# Patient Record
Sex: Male | Born: 1940 | Race: White | Hispanic: No | Smoking: Current every day smoker
Health system: Southern US, Community
[De-identification: ages and names within clinical notes are randomized; demographics above are authoritative.]

## PROBLEM LIST (undated history)

## (undated) DIAGNOSIS — I10 Essential (primary) hypertension: Secondary | ICD-10-CM

## (undated) DIAGNOSIS — E785 Hyperlipidemia, unspecified: Secondary | ICD-10-CM

## (undated) DIAGNOSIS — J329 Chronic sinusitis, unspecified: Secondary | ICD-10-CM

## (undated) DIAGNOSIS — N400 Enlarged prostate without lower urinary tract symptoms: Secondary | ICD-10-CM

## (undated) DIAGNOSIS — C439 Malignant melanoma of skin, unspecified: Secondary | ICD-10-CM

## (undated) DIAGNOSIS — F17200 Nicotine dependence, unspecified, uncomplicated: Secondary | ICD-10-CM

## (undated) DIAGNOSIS — S62509A Fracture of unspecified phalanx of unspecified thumb, initial encounter for closed fracture: Secondary | ICD-10-CM

## (undated) HISTORY — PX: LYMPH NODE BIOPSY: SHX201

## (undated) HISTORY — DX: Malignant melanoma of skin, unspecified: C43.9

## (undated) HISTORY — DX: Hyperlipidemia, unspecified: E78.5

## (undated) HISTORY — PX: SPLENECTOMY, TOTAL: SHX788

## (undated) HISTORY — DX: Essential (primary) hypertension: I10

## (undated) HISTORY — DX: Nicotine dependence, unspecified, uncomplicated: F17.200

## (undated) HISTORY — PX: PARTIAL COLECTOMY: SHX5273

## (undated) HISTORY — DX: Fracture of unspecified phalanx of unspecified thumb, initial encounter for closed fracture: S62.509A

## (undated) HISTORY — DX: Chronic sinusitis, unspecified: J32.9

## (undated) HISTORY — PX: MELANOMA EXCISION: SHX5266

---

## 1998-01-09 ENCOUNTER — Encounter: Payer: Self-pay | Admitting: General Surgery

## 1998-01-09 ENCOUNTER — Ambulatory Visit (HOSPITAL_COMMUNITY): Admission: RE | Admit: 1998-01-09 | Discharge: 1998-01-09 | Payer: Self-pay | Admitting: General Surgery

## 1998-01-27 ENCOUNTER — Encounter: Payer: Self-pay | Admitting: General Surgery

## 1998-01-27 ENCOUNTER — Ambulatory Visit (HOSPITAL_COMMUNITY): Admission: AD | Admit: 1998-01-27 | Discharge: 1998-01-28 | Payer: Self-pay | Admitting: General Surgery

## 2004-04-19 ENCOUNTER — Ambulatory Visit: Payer: Self-pay | Admitting: Family Medicine

## 2010-07-25 ENCOUNTER — Encounter: Payer: Self-pay | Admitting: Physician Assistant

## 2012-07-23 ENCOUNTER — Ambulatory Visit (INDEPENDENT_AMBULATORY_CARE_PROVIDER_SITE_OTHER): Payer: Medicare Other | Admitting: Nurse Practitioner

## 2012-07-23 ENCOUNTER — Encounter: Payer: Self-pay | Admitting: Nurse Practitioner

## 2012-07-23 VITALS — BP 108/68 | HR 59 | Temp 97.7°F | Ht 71.0 in | Wt 230.0 lb

## 2012-07-23 DIAGNOSIS — E785 Hyperlipidemia, unspecified: Secondary | ICD-10-CM | POA: Insufficient documentation

## 2012-07-23 DIAGNOSIS — I10 Essential (primary) hypertension: Secondary | ICD-10-CM

## 2012-07-23 LAB — COMPLETE METABOLIC PANEL WITH GFR
ALT: 19 U/L (ref 0–53)
AST: 20 U/L (ref 0–37)
Albumin: 4.1 g/dL (ref 3.5–5.2)
Alkaline Phosphatase: 44 U/L (ref 39–117)
BUN: 13 mg/dL (ref 6–23)
CO2: 27 mEq/L (ref 19–32)
Calcium: 9.6 mg/dL (ref 8.4–10.5)
Chloride: 102 mEq/L (ref 96–112)
Creat: 1.13 mg/dL (ref 0.50–1.35)
GFR, Est African American: 76 mL/min
GFR, Est Non African American: 65 mL/min
Glucose, Bld: 107 mg/dL — ABNORMAL HIGH (ref 70–99)
Potassium: 4.2 mEq/L (ref 3.5–5.3)
Sodium: 138 mEq/L (ref 135–145)
Total Bilirubin: 0.7 mg/dL (ref 0.3–1.2)
Total Protein: 6.9 g/dL (ref 6.0–8.3)

## 2012-07-23 MED ORDER — SIMVASTATIN 40 MG PO TABS: 40.0000 mg | ORAL_TABLET | Freq: Every day | ORAL | Status: DC

## 2012-07-23 MED ORDER — LISINOPRIL-HYDROCHLOROTHIAZIDE 10-12.5 MG PO TABS: 1.0000 | ORAL_TABLET | Freq: Every day | ORAL | Status: DC

## 2012-07-23 NOTE — Progress Notes (Signed)
  Subjective:    Patient ID: Andre Estrada, male    DOB: 12-Jun-1940, 72 y.o.   MRN: [id number]  Hypertension This is a chronic problem. The current episode started more than 1 year ago. The problem has been resolved since onset. The problem is controlled. Pertinent negatives include no blurred vision, chest pain, palpitations, peripheral edema or shortness of breath. Risk factors for coronary artery disease include dyslipidemia, male gender, family history and smoking/tobacco exposure. Past treatments include ACE inhibitors and diuretics. The current treatment provides moderate improvement. There is no history of a thyroid problem.  Hyperlipidemia This is a chronic problem. The current episode started more than 1 year ago. The problem is uncontrolled. Recent lipid tests were reviewed and are high. He has no history of diabetes or hypothyroidism. Pertinent negatives include no chest pain, leg pain or shortness of breath. Current antihyperlipidemic treatment includes statins. The current treatment provides mild improvement of lipids. Compliance problems include adherence to diet.  Risk factors for coronary artery disease include hypertension, male sex and dyslipidemia.      Review of Systems  Constitutional: Negative.   HENT: Negative.   Eyes: Negative for blurred vision.  Respiratory: Negative for shortness of breath.   Cardiovascular: Negative for chest pain and palpitations.  All other systems reviewed and are negative.       Objective:   Physical Exam  Constitutional: He is oriented to person, place, and time. He appears well-developed and well-nourished. No distress.  HENT:  Head: Normocephalic.  Eyes: Pupils are equal, round, and reactive to light.  Neck: Normal range of motion. Neck supple. No JVD present. No thyromegaly present.  Cardiovascular: Normal rate, regular rhythm and normal heart sounds.   Pulmonary/Chest: Effort normal.  Diminished breath sounds bilaterally    Abdominal: Soft. Bowel sounds are normal. There is no tenderness. There is no guarding.  Musculoskeletal: Normal range of motion. He exhibits no edema.  Neurological: He is alert and oriented to person, place, and time.  Skin: Skin is warm and dry.  Birthmark on left face around eye   Psychiatric: He has a normal mood and affect. His behavior is normal. Judgment and thought content normal.    BP 108/68  Pulse 59  Temp(Src) 97.7 F (36.5 C) (Oral)  Ht 5\' 11"  (1.803 m)  Wt 230 lb (104.327 kg)  BMI 32.09 kg/m2       Assessment & Plan:  1. Hypertension Low NA+ diet - COMPLETE METABOLIC PANEL WITH GFR - lisinopril-hydrochlorothiazide (PRINZIDE,ZESTORETIC) 10-12.5 MG per tablet; Take 1 tablet by mouth daily.  Dispense: 30 tablet; Refill: 5  2. Hyperlipemia Low fat diet and exercise - NMR Lipoprofile with Lipids - simvastatin (ZOCOR) 40 MG tablet; Take 1 tablet (40 mg total) by mouth at bedtime.  Dispense: 30 tablet; Refill: 5  [name], FNP

## 2012-07-23 NOTE — Patient Instructions (Addendum)

## 2012-07-24 LAB — NMR LIPOPROFILE WITH LIPIDS
Cholesterol, Total: 123 mg/dL (ref <200)
HDL Particle Number: 28.8 umol/L — ABNORMAL LOW (ref >=30.5)
HDL Size: 8.6 nm — ABNORMAL LOW (ref >=9.2)
HDL-C: 33 mg/dL — ABNORMAL LOW (ref >=40)
LDL (calc): 68 mg/dL (ref <100)
LDL Particle Number: 1253 nmol/L — ABNORMAL HIGH (ref <1000)
LDL Size: 19.8 nm — ABNORMAL LOW (ref >20.5)
LP-IR Score: 66 — ABNORMAL HIGH (ref <=45)
Large HDL-P: 1.4 umol/L — ABNORMAL LOW (ref >=4.8)
Large VLDL-P: 2.4 nmol/L (ref <=2.7)
Small LDL Particle Number: 988 nmol/L — ABNORMAL HIGH (ref <=527)
Triglycerides: 109 mg/dL (ref <150)
VLDL Size: 44.5 nm (ref <=46.6)

## 2013-01-20 ENCOUNTER — Ambulatory Visit (INDEPENDENT_AMBULATORY_CARE_PROVIDER_SITE_OTHER): Payer: Medicare Other | Admitting: Family Medicine

## 2013-01-20 ENCOUNTER — Encounter: Payer: Self-pay | Admitting: Family Medicine

## 2013-01-20 VITALS — BP 124/75 | HR 68 | Temp 97.7°F | Ht 71.5 in | Wt 207.0 lb

## 2013-01-20 DIAGNOSIS — Z7189 Other specified counseling: Secondary | ICD-10-CM

## 2013-01-20 DIAGNOSIS — E785 Hyperlipidemia, unspecified: Secondary | ICD-10-CM

## 2013-01-20 DIAGNOSIS — F17200 Nicotine dependence, unspecified, uncomplicated: Secondary | ICD-10-CM

## 2013-01-20 DIAGNOSIS — Z716 Tobacco abuse counseling: Secondary | ICD-10-CM

## 2013-01-20 DIAGNOSIS — I10 Essential (primary) hypertension: Secondary | ICD-10-CM

## 2013-01-20 LAB — POCT CBC
Granulocyte percent: 70.6 % (ref 37–80)
HCT, POC: 50.9 % (ref 43.5–53.7)
Hemoglobin: 16.5 g/dL (ref 14.1–18.1)
Lymph, poc: 2.8 (ref 0.6–3.4)
MCH, POC: 30.5 pg (ref 27–31.2)
MCHC: 32.5 g/dL (ref 31.8–35.4)
MCV: 94 fL (ref 80–97)
MPV: 8.8 fL (ref 0–99.8)
POC Granulocyte: 7.7 — AB (ref 2–6.9)
POC LYMPH PERCENT: 25.3 % (ref 10–50)
Platelet Count, POC: 227 10*3/uL (ref 142–424)
RBC: 5.4 M/uL (ref 4.69–6.13)
RDW, POC: 13.2 %
WBC: 10.9 10*3/uL — AB (ref 4.6–10.2)

## 2013-01-20 LAB — POCT GLYCOSYLATED HEMOGLOBIN (HGB A1C): Hemoglobin A1C: 5.3

## 2013-01-20 NOTE — Progress Notes (Signed)
   Subjective:    Patient ID: Andre Estrada, male    DOB: [birth year], 72 y.o.   MRN: [id number]  HPI Pt here for follow up and management of chronic medical problems.  No acute issues currently  HLD: Stil eating somewhat high fat diet.  Is compliant with zocor.  Smoking 1 PPD  Is trying to quit.  Taking baby ASA daily Not exercising  Mood/Social: Wife passed away about 7 months ago from complications of colon cancer.  Overall mood stable.  Is taking vistaril prn for anxiety, but feels like he doesn't really need it.   HTN: No CP or SOB Is not checking BPs daily.      Patient Active Problem List   Diagnosis Date Noted  . Hypertension 07/23/2012  . Hyperlipemia 07/23/2012   Outpatient Encounter Prescriptions as of 01/20/2013  Medication Sig  . aspirin 81 MG tablet Take 81 mg by mouth daily.  Marland Kitchen docusate sodium (COLACE) 100 MG capsule Take 100 mg by mouth 2 (two) times daily.  . hydrOXYzine (VISTARIL) 25 MG capsule Take 25 mg by mouth every 6 (six) hours as needed for anxiety.  Marland Kitchen lisinopril-hydrochlorothiazide (PRINZIDE,ZESTORETIC) 10-12.5 MG per tablet Take 1 tablet by mouth daily.  . simvastatin (ZOCOR) 40 MG tablet Take 1 tablet (40 mg total) by mouth at bedtime.    Review of Systems  Constitutional: Negative.   HENT: Negative.   Eyes: Negative.   Respiratory: Negative.   Cardiovascular: Negative.   Gastrointestinal: Negative.   Endocrine: Negative.   Genitourinary: Negative.   Musculoskeletal: Negative.   Skin: Negative.   Allergic/Immunologic: Negative.   Neurological: Negative.   Hematological: Negative.   Psychiatric/Behavioral: Negative.  Nervous/anxious: wife passed away in 07-20-2022.        Objective:   Physical Exam  Constitutional: He appears well-developed and well-nourished.  HENT:  Head: Normocephalic and atraumatic.  Eyes: Conjunctivae are normal. Pupils are equal, round, and reactive to light.  Neck: Normal range of motion. Neck supple.    Cardiovascular: Normal rate and regular rhythm.   Pulmonary/Chest: Effort normal and breath sounds normal.  Abdominal: Soft.  Musculoskeletal: Normal range of motion.  Neurological: He is alert.  Skin:  l sided hemangioma stable.     BP 124/75  Pulse 68  Temp(Src) 97.7 F (36.5 C) (Oral)  Ht 5' 11.5" (1.816 m)  Wt 207 lb (93.895 kg)  BMI 28.47 kg/m2        Assessment & Plan:  Hypertension - Plan: Comprehensive metabolic panel, POCT CBC, POCT glycosylated hemoglobin (Hb A1C), Comprehensive metabolic panel  Hyperlipemia - Plan: NMR, lipoprofile  Check chronic labs  Tobacco abuse counseling.  BP stable.  Continue baby ASA up to date on immunizations and colonoscopy. Follow up in 6-12 months.

## 2013-01-20 NOTE — Patient Instructions (Signed)
Continue current medications. Continue good therapeutic lifestyle changes which include good diet and exercise. Fall precautions discussed with patient. Schedule your flu vaccine if you haven't had it yet If you are over 71 years old - you may need Prevnar 13 or the adult Pneumonia vaccine.  

## 2013-01-22 LAB — CMP14+EGFR
Albumin/Globulin Ratio: 1.8 (ref 1.1–2.5)
Albumin: 4.4 g/dL (ref 3.5–4.8)
Alkaline Phosphatase: 52 IU/L (ref 39–117)
BUN/Creatinine Ratio: 12 (ref 10–22)
BUN: 13 mg/dL (ref 8–27)
CO2: 27 mmol/L (ref 18–29)
Chloride: 98 mmol/L (ref 97–108)
Creatinine, Ser: 1.09 mg/dL (ref 0.76–1.27)
GFR calc Af Amer: 79 mL/min/{1.73_m2} (ref >59)
GFR calc non Af Amer: 68 mL/min/{1.73_m2} (ref >59)
Globulin, Total: 2.4 g/dL (ref 1.5–4.5)
Sodium: 142 mmol/L (ref 134–144)
Total Bilirubin: 0.6 mg/dL (ref 0.0–1.2)
Total Protein: 6.8 g/dL (ref 6.0–8.5)

## 2013-01-22 LAB — NMR, LIPOPROFILE
Cholesterol: 129 mg/dL (ref <200)
HDL Cholesterol by NMR: 35 mg/dL — ABNORMAL LOW (ref >=40)
LDL Particle Number: 1232 nmol/L — ABNORMAL HIGH (ref <1000)
LP-IR Score: 60 — ABNORMAL HIGH (ref <=45)
Small LDL Particle Number: 599 nmol/L — ABNORMAL HIGH (ref <=527)
Triglycerides by NMR: 140 mg/dL (ref <150)

## 2013-01-25 ENCOUNTER — Other Ambulatory Visit: Payer: Self-pay | Admitting: Family Medicine

## 2013-01-25 DIAGNOSIS — E785 Hyperlipidemia, unspecified: Secondary | ICD-10-CM

## 2013-01-25 MED ORDER — ROSUVASTATIN CALCIUM 40 MG PO TABS: 40.0000 mg | ORAL_TABLET | Freq: Every day | ORAL | Status: DC

## 2013-02-01 ENCOUNTER — Other Ambulatory Visit: Payer: Self-pay

## 2013-02-01 DIAGNOSIS — I10 Essential (primary) hypertension: Secondary | ICD-10-CM

## 2013-02-01 MED ORDER — LISINOPRIL-HYDROCHLOROTHIAZIDE 10-12.5 MG PO TABS: 1.0000 | ORAL_TABLET | Freq: Every day | ORAL | Status: DC

## 2013-02-08 ENCOUNTER — Telehealth: Payer: Self-pay | Admitting: *Deleted

## 2013-02-09 ENCOUNTER — Telehealth: Payer: Self-pay | Admitting: *Deleted

## 2013-02-09 NOTE — Telephone Encounter (Signed)
Pt notified of lab results Verbalizes understanding 

## 2013-02-09 NOTE — Telephone Encounter (Signed)
Pt notified of results

## 2013-04-05 ENCOUNTER — Other Ambulatory Visit: Payer: Self-pay

## 2013-04-05 NOTE — Telephone Encounter (Signed)
Last seen 01/20/13  Dr Ernestina Patches  This med not on EPIC list

## 2013-04-06 MED ORDER — SIMVASTATIN 40 MG PO TABS: 40.0000 mg | ORAL_TABLET | Freq: Every day | ORAL | Status: DC

## 2013-07-01 ENCOUNTER — Other Ambulatory Visit: Payer: Self-pay

## 2013-07-01 MED ORDER — SIMVASTATIN 40 MG PO TABS: 40.0000 mg | ORAL_TABLET | Freq: Every day | ORAL | Status: DC

## 2013-07-01 NOTE — Telephone Encounter (Signed)
Last lipid 01/20/14  Requesting 90 day supply  Dr Ernestina Patches

## 2013-08-06 ENCOUNTER — Ambulatory Visit (INDEPENDENT_AMBULATORY_CARE_PROVIDER_SITE_OTHER): Payer: Medicare Other | Admitting: Nurse Practitioner

## 2013-08-06 ENCOUNTER — Encounter: Payer: Self-pay | Admitting: Nurse Practitioner

## 2013-08-06 VITALS — BP 120/62 | HR 83 | Temp 97.9°F | Ht 71.5 in | Wt 198.4 lb

## 2013-08-06 DIAGNOSIS — I10 Essential (primary) hypertension: Secondary | ICD-10-CM

## 2013-08-06 DIAGNOSIS — I158 Other secondary hypertension: Secondary | ICD-10-CM

## 2013-08-06 DIAGNOSIS — E785 Hyperlipidemia, unspecified: Secondary | ICD-10-CM

## 2013-08-06 MED ORDER — SIMVASTATIN 40 MG PO TABS
40.0000 mg | ORAL_TABLET | Freq: Every day | ORAL | Status: DC
Start: 2013-08-06 — End: 2014-02-14

## 2013-08-06 MED ORDER — LISINOPRIL-HYDROCHLOROTHIAZIDE 10-12.5 MG PO TABS: 1.0000 | ORAL_TABLET | Freq: Every day | ORAL | Status: DC

## 2013-08-06 NOTE — Progress Notes (Signed)
  Subjective:    Patient ID: Andre Estrada, male    DOB: 05-Jul-1940, 73 y.o.   MRN: [id number]  Patient here today for chronic follow.   Hypertension This is a chronic problem. The current episode started more than 1 year ago. The problem has been resolved since onset. The problem is controlled. Pertinent negatives include no blurred vision, chest pain, palpitations, peripheral edema or shortness of breath. Risk factors for coronary artery disease include dyslipidemia, male gender, family history and smoking/tobacco exposure. Past treatments include ACE inhibitors and diuretics. The current treatment provides moderate improvement. There is no history of a thyroid problem.  Hyperlipidemia This is a chronic problem. The current episode started more than 1 year ago. The problem is uncontrolled. Recent lipid tests were reviewed and are high. He has no history of diabetes or hypothyroidism. Pertinent negatives include no chest pain, leg pain or shortness of breath. Current antihyperlipidemic treatment includes statins. The current treatment provides mild improvement of lipids. Compliance problems include adherence to diet.  Risk factors for coronary artery disease include hypertension, male sex and dyslipidemia.      Review of Systems  Constitutional: Negative.   HENT: Positive for congestion. Negative for sore throat.   Eyes: Negative.  Negative for blurred vision.  Respiratory: Positive for cough. Negative for shortness of breath.   Cardiovascular: Negative.  Negative for chest pain and palpitations.  Gastrointestinal: Negative.   Endocrine: Negative.   Genitourinary: Negative.   Musculoskeletal: Negative.   Allergic/Immunologic: Negative.   Neurological: Negative.   Hematological: Negative.   Psychiatric/Behavioral: Negative.   All other systems reviewed and are negative.      Objective:   Physical Exam  Constitutional: He is oriented to person, place, and time. He appears  well-developed and well-nourished. No distress.  HENT:  Head: Normocephalic.  Eyes: Pupils are equal, round, and reactive to light.  Neck: Normal range of motion. Neck supple. No JVD present. No thyromegaly present.  Cardiovascular: Normal rate, regular rhythm and normal heart sounds.   Pulmonary/Chest: Effort normal.  Diminished breath sounds bilaterally   Abdominal: Soft. Bowel sounds are normal. There is no tenderness. There is no guarding.  Musculoskeletal: Normal range of motion. He exhibits no edema.  Neurological: He is alert and oriented to person, place, and time.  Skin: Skin is warm and dry.  Birthmark on left face around eye   Psychiatric: He has a normal mood and affect. His behavior is normal. Judgment and thought content normal.     BP 120/62  Pulse 83  Temp(Src) 97.9 F (36.6 C) (Oral)  Ht 5' 11.5" (1.816 m)  Wt 198 lb 6.4 oz (89.994 kg)  BMI 27.29 kg/m2      Assessment & Plan:   1. Hyperlipemia   2. Essential hypertension     Orders Placed This Encounter  Procedures  . CMP14+EGFR  . NMR, lipoprofile    hemoccult cards given to patient with directions Labs pending Health maintenance reviewed Diet and exercise encouraged Continue all meds Follow up  In 6 montH  Delta, FNP

## 2013-08-06 NOTE — Patient Instructions (Signed)
Health Maintenance A healthy lifestyle and preventative care can promote health and wellness.  Maintain regular health, dental, and eye exams.  Eat a healthy diet. Foods like vegetables, fruits, whole grains, low-fat dairy products, and lean protein foods contain the nutrients you need and are low in calories. Decrease your intake of foods high in solid fats, added sugars, and salt. Get information about a proper diet from your health care Indio Santilli, if necessary.  Regular physical exercise is one of the most important things you can do for your health. Most adults should get at least 150 minutes of moderate-intensity exercise (any activity that increases your heart rate and causes you to sweat) each week. In addition, most adults need muscle-strengthening exercises on 2 or more days a week.   Maintain a healthy weight. The body mass index (BMI) is a screening tool to identify possible weight problems. It provides an estimate of body fat based on height and weight. Your health care Gordy Goar can find your BMI and can help you achieve or maintain a healthy weight. For males 20 years and older:  A BMI below 18.5 is considered underweight.  A BMI of 18.5 to 24.9 is normal.  A BMI of 25 to 29.9 is considered overweight.  A BMI of 30 and above is considered obese.  Maintain normal blood lipids and cholesterol by exercising and minimizing your intake of saturated fat. Eat a balanced diet with plenty of fruits and vegetables. Blood tests for lipids and cholesterol should begin at age 20 and be repeated every 5 years. If your lipid or cholesterol levels are high, you are over age 50, or you are at high risk for heart disease, you may need your cholesterol levels checked more frequently.Ongoing high lipid and cholesterol levels should be treated with medicines if diet and exercise are not working.  If you smoke, find out from your health care Brinlynn Gorton how to quit. If you do not use tobacco, do not  start.  Lung cancer screening is recommended for adults aged 55-80 years who are at high risk for developing lung cancer because of a history of smoking. A yearly low-dose CT scan of the lungs is recommended for people who have at least a 30-pack-year history of smoking and are current smokers or have quit within the past 15 years. A pack year of smoking is smoking an average of 1 pack of cigarettes a day for 1 year (for example, a 30-pack-year history of smoking could mean smoking 1 pack a day for 30 years or 2 packs a day for 15 years). Yearly screening should continue until the smoker has stopped smoking for at least 15 years. Yearly screening should be stopped for people who develop a health problem that would prevent them from having lung cancer treatment.  If you choose to drink alcohol, do not have more than 2 drinks per day. One drink is considered to be 12 oz (360 mL) of beer, 5 oz (150 mL) of wine, or 1.5 oz (45 mL) of liquor.  Avoid the use of street drugs. Do not share needles with anyone. Ask for help if you need support or instructions about stopping the use of drugs.  High blood pressure causes heart disease and increases the risk of stroke. Blood pressure should be checked at least every 1-2 years. Ongoing high blood pressure should be treated with medicines if weight loss and exercise are not effective.  If you are 45-79 years old, ask your health care Haruka Kowaleski if   you should take aspirin to prevent heart disease.  Diabetes screening involves taking a blood sample to check your fasting blood sugar level. This should be done once every 3 years after age 45 if you are at a normal weight and without risk factors for diabetes. Testing should be considered at a younger age or be carried out more frequently if you are overweight and have at least 1 risk factor for diabetes.  Colorectal cancer can be detected and often prevented. Most routine colorectal cancer screening begins at the age of 50  and continues through age 75. However, your health care Othel Dicostanzo may recommend screening at an earlier age if you have risk factors for colon cancer. On a yearly basis, your health care Ericha Whittingham may provide home test kits to check for hidden blood in the stool. A small camera at the end of a tube may be used to directly examine the colon (sigmoidoscopy or colonoscopy) to detect the earliest forms of colorectal cancer. Talk to your health care Faris Coolman about this at age 50 when routine screening begins. A direct exam of the colon should be repeated every 5-10 years through age 75, unless early forms of precancerous polyps or small growths are found.  People who are at an increased risk for hepatitis B should be screened for this virus. You are considered at high risk for hepatitis B if:  You were born in a country where hepatitis B occurs often. Talk with your health care Jaskirat Zertuche about which countries are considered high risk.  Your parents were born in a high-risk country and you have not received a shot to protect against hepatitis B (hepatitis B vaccine).  You have HIV or AIDS.  You use needles to inject street drugs.  You live with, or have sex with, someone who has hepatitis B.  You are a man who has sex with other men (MSM).  You get hemodialysis treatment.  You take certain medicines for conditions like cancer, organ transplantation, and autoimmune conditions.  Hepatitis C blood testing is recommended for all people born from 1945 through 1965 and any individual with known risk factors for hepatitis C.  Healthy men should no longer receive prostate-specific antigen (PSA) blood tests as part of routine cancer screening. Talk to your health care Dyamond Tolosa about prostate cancer screening.  Testicular cancer screening is not recommended for adolescents or adult males who have no symptoms. Screening includes self-exam, a health care Kanna Dafoe exam, and other screening tests. Consult with your  health care Jasaiah Karwowski about any symptoms you have or any concerns you have about testicular cancer.  Practice safe sex. Use condoms and avoid high-risk sexual practices to reduce the spread of sexually transmitted infections (STIs).  You should be screened for STIs, including gonorrhea and chlamydia if:  You are sexually active and are younger than 24 years.  You are older than 24 years, and your health care Gertude Benito tells you that you are at risk for this type of infection.  Your sexual activity has changed since you were last screened, and you are at an increased risk for chlamydia or gonorrhea. Ask your health care Kaemon Barnett if you are at risk.  If you are at risk of being infected with HIV, it is recommended that you take a prescription medicine daily to prevent HIV infection. This is called pre-exposure prophylaxis (PrEP). You are considered at risk if:  You are a man who has sex with other men (MSM).  You are a heterosexual man who   is sexually active with multiple partners.  You take drugs by injection.  You are sexually active with a partner who has HIV.  Talk with your health care Clements Toro about whether you are at high risk of being infected with HIV. If you choose to begin PrEP, you should first be tested for HIV. You should then be tested every 3 months for as long as you are taking PrEP.  Use sunscreen. Apply sunscreen liberally and repeatedly throughout the day. You should seek shade when your shadow is shorter than you. Protect yourself by wearing long sleeves, pants, a wide-brimmed hat, and sunglasses year round whenever you are outdoors.  Tell your health care Krystall Kruckenberg of new moles or changes in moles, especially if there is a change in shape or color. Also, tell your health care Naphtali Zywicki if a mole is larger than the size of a pencil eraser.  A one-time screening for abdominal aortic aneurysm (AAA) and surgical repair of large AAAs by ultrasound is recommended for men aged  65-75 years who are current or former smokers.  Stay current with your vaccines (immunizations). Document Released: 08/03/2007 Document Revised: 02/09/2013 Document Reviewed: 07/02/2010 Exit[employer] Patient Information 2015 [employer]. This information is not intended to replace advice given to you by your health care Arbor Cohen. Make sure you discuss any questions you have with your health care Stepanie Graver.  

## 2013-08-07 LAB — CMP14+EGFR
A/G RATIO: 1.3 (ref 1.1–2.5)
ALT: 16 IU/L (ref 0–44)
AST: 19 IU/L (ref 0–40)
Albumin: 3.9 g/dL (ref 3.5–4.8)
Alkaline Phosphatase: 47 IU/L (ref 39–117)
BUN/Creatinine Ratio: 13 (ref 10–22)
BUN: 13 mg/dL (ref 8–27)
CALCIUM: 9.6 mg/dL (ref 8.6–10.2)
CO2: 25 mmol/L (ref 18–29)
CREATININE: 1.04 mg/dL (ref 0.76–1.27)
Chloride: 102 mmol/L (ref 97–108)
GFR calc Af Amer: 83 mL/min/{1.73_m2} (ref >59)
GFR, EST NON AFRICAN AMERICAN: 72 mL/min/{1.73_m2} (ref >59)
GLUCOSE: 106 mg/dL — AB (ref 65–99)
Globulin, Total: 2.9 g/dL (ref 1.5–4.5)
Potassium: 4.5 mmol/L (ref 3.5–5.2)
SODIUM: 143 mmol/L (ref 134–144)
TOTAL PROTEIN: 6.8 g/dL (ref 6.0–8.5)
Total Bilirubin: 0.4 mg/dL (ref 0.0–1.2)

## 2013-08-07 LAB — NMR, LIPOPROFILE
CHOLESTEROL: 112 mg/dL (ref 100–199)
HDL CHOLESTEROL BY NMR: 31 mg/dL — AB (ref >39)
HDL Particle Number: 22.2 umol/L — ABNORMAL LOW (ref >=30.5)
LDL Particle Number: 942 nmol/L (ref <1000)
LDL Size: 20.5 nm (ref >20.5)
LDLC SERPL CALC-MCNC: 60 mg/dL (ref 0–99)
LP-IR Score: 44 (ref <=45)
Small LDL Particle Number: 559 nmol/L — ABNORMAL HIGH (ref <=527)
TRIGLYCERIDES BY NMR: 103 mg/dL (ref 0–149)

## 2013-08-09 ENCOUNTER — Telehealth: Payer: Self-pay | Admitting: Family Medicine

## 2013-08-09 NOTE — Telephone Encounter (Signed)
Message copied by Waverly Ferrari on Mon Aug 09, 2013 10:14 AM ------      Message from: Chevis Pretty      Created: Sat Aug 07, 2013  9:45 AM       Kidney and liver function stable      Cholesterol looks great      Continue current meds- low fat diet and exercise and recheck in 3 months       ------

## 2013-08-09 NOTE — Telephone Encounter (Signed)
Pt aware of lab results 

## 2013-08-12 ENCOUNTER — Other Ambulatory Visit: Payer: Medicare Other

## 2013-08-12 DIAGNOSIS — Z1212 Encounter for screening for malignant neoplasm of rectum: Secondary | ICD-10-CM

## 2013-08-13 LAB — FECAL OCCULT BLOOD, IMMUNOCHEMICAL: Fecal Occult Bld: NEGATIVE

## 2013-09-13 ENCOUNTER — Encounter: Payer: Self-pay | Admitting: *Deleted

## 2013-11-08 ENCOUNTER — Ambulatory Visit (INDEPENDENT_AMBULATORY_CARE_PROVIDER_SITE_OTHER): Payer: Medicare Other | Admitting: Nurse Practitioner

## 2013-11-08 ENCOUNTER — Encounter: Payer: Self-pay | Admitting: Nurse Practitioner

## 2013-11-08 VITALS — BP 139/92 | HR 97 | Temp 97.4°F | Ht 71.5 in | Wt 198.0 lb

## 2013-11-08 DIAGNOSIS — I159 Secondary hypertension, unspecified: Secondary | ICD-10-CM

## 2013-11-08 DIAGNOSIS — I158 Other secondary hypertension: Secondary | ICD-10-CM

## 2013-11-08 DIAGNOSIS — E785 Hyperlipidemia, unspecified: Secondary | ICD-10-CM

## 2013-11-08 NOTE — Progress Notes (Signed)
  Subjective:    Patient ID: Andre Estrada, male    DOB: [birth year], 73 y.o.   MRN: [id number]  Patient here today for chronic disease follow up. No acute complaint.    Hypertension This is a chronic problem. The current episode started more than 1 year ago. The problem has been resolved since onset. The problem is controlled. Pertinent negatives include no blurred vision, chest pain, palpitations, peripheral edema or shortness of breath. Risk factors for coronary artery disease include dyslipidemia, male gender, family history and smoking/tobacco exposure. Past treatments include ACE inhibitors and diuretics. The current treatment provides moderate improvement. There is no history of a thyroid problem.  Hyperlipidemia This is a chronic problem. The current episode started more than 1 year ago. The problem is uncontrolled. Recent lipid tests were reviewed and are high. He has no history of diabetes or hypothyroidism. Pertinent negatives include no chest pain, leg pain or shortness of breath. Current antihyperlipidemic treatment includes statins. The current treatment provides mild improvement of lipids. Compliance problems include adherence to diet.  Risk factors for coronary artery disease include hypertension, male sex and dyslipidemia.      Review of Systems  Constitutional: Negative.   HENT: Negative.  Negative for sore throat.   Eyes: Negative.  Negative for blurred vision.  Respiratory: Negative.  Negative for shortness of breath.   Cardiovascular: Negative.  Negative for chest pain and palpitations.  Gastrointestinal: Negative.   Endocrine: Negative.   Genitourinary: Negative.   Musculoskeletal: Negative.   Skin: Negative.   Allergic/Immunologic: Negative.   Neurological: Negative.   Hematological: Negative.   Psychiatric/Behavioral: Negative.   All other systems reviewed and are negative.      Objective:   Physical Exam  Constitutional: He is oriented to person, place, and  time. He appears well-developed and well-nourished. No distress.  HENT:  Head: Normocephalic.  Eyes: Pupils are equal, round, and reactive to light.  Neck: Normal range of motion. Neck supple. No JVD present. No thyromegaly present.  Cardiovascular: Normal rate, regular rhythm and normal heart sounds.   Pulmonary/Chest: Effort normal.  Diminished breath sounds bilaterally.   Abdominal: Soft. Bowel sounds are normal. There is no tenderness. There is no guarding.  Musculoskeletal: Normal range of motion. He exhibits no edema.  Neurological: He is alert and oriented to person, place, and time.  Skin: Skin is warm and dry.  Birthmark on left face around eye   Psychiatric: He has a normal mood and affect. His behavior is normal. Judgment and thought content normal.     BP 139/92  Pulse 97  Temp(Src) 97.4 F (36.3 C) (Oral)  Ht 5' 11.5" (1.816 m)  Wt 198 lb (89.812 kg)  BMI 27.23 kg/m2      Assessment & Plan:   1. Secondary hypertension, unspecified - CMP14+EGFR  2. Hyperlipemia - NMR, lipoprofile    Labs pending Health maintenance reviewed Diet and exercise encouraged Continue all meds Follow up  In 3 months PRN.    Mary-Margaret Hassell Done, FNP

## 2013-11-08 NOTE — Patient Instructions (Signed)

## 2013-11-09 LAB — NMR, LIPOPROFILE
CHOLESTEROL: 143 mg/dL (ref 100–199)
HDL Cholesterol by NMR: 48 mg/dL (ref >39)
HDL Particle Number: 28.5 umol/L — ABNORMAL LOW (ref >=30.5)
LDL Particle Number: 876 nmol/L (ref <1000)
LDL SIZE: 20.9 nm (ref >20.5)
LDLC SERPL CALC-MCNC: 78 mg/dL (ref 0–99)
LP-IR Score: 41 (ref <=45)
Small LDL Particle Number: 161 nmol/L (ref <=527)
TRIGLYCERIDES BY NMR: 83 mg/dL (ref 0–149)

## 2013-11-09 LAB — CMP14+EGFR
ALK PHOS: 44 IU/L (ref 39–117)
ALT: 13 IU/L (ref 0–44)
AST: 17 IU/L (ref 0–40)
Albumin/Globulin Ratio: 2 (ref 1.1–2.5)
Albumin: 4.4 g/dL (ref 3.5–4.8)
BILIRUBIN TOTAL: 0.5 mg/dL (ref 0.0–1.2)
BUN / CREAT RATIO: 15 (ref 10–22)
BUN: 16 mg/dL (ref 8–27)
CO2: 24 mmol/L (ref 18–29)
CREATININE: 1.06 mg/dL (ref 0.76–1.27)
Calcium: 9.6 mg/dL (ref 8.6–10.2)
Chloride: 103 mmol/L (ref 97–108)
GFR calc Af Amer: 81 mL/min/{1.73_m2} (ref >59)
GFR, EST NON AFRICAN AMERICAN: 70 mL/min/{1.73_m2} (ref >59)
GLOBULIN, TOTAL: 2.2 g/dL (ref 1.5–4.5)
GLUCOSE: 99 mg/dL (ref 65–99)
Potassium: 4.1 mmol/L (ref 3.5–5.2)
Sodium: 143 mmol/L (ref 134–144)
TOTAL PROTEIN: 6.6 g/dL (ref 6.0–8.5)

## 2013-11-17 ENCOUNTER — Ambulatory Visit (INDEPENDENT_AMBULATORY_CARE_PROVIDER_SITE_OTHER): Payer: Medicare Other

## 2013-11-17 DIAGNOSIS — Z23 Encounter for immunization: Secondary | ICD-10-CM

## 2014-02-14 ENCOUNTER — Encounter: Payer: Self-pay | Admitting: Nurse Practitioner

## 2014-02-14 ENCOUNTER — Ambulatory Visit (INDEPENDENT_AMBULATORY_CARE_PROVIDER_SITE_OTHER): Payer: Medicare Other | Admitting: Nurse Practitioner

## 2014-02-14 ENCOUNTER — Ambulatory Visit (INDEPENDENT_AMBULATORY_CARE_PROVIDER_SITE_OTHER): Payer: Medicare Other

## 2014-02-14 DIAGNOSIS — I10 Essential (primary) hypertension: Secondary | ICD-10-CM

## 2014-02-14 DIAGNOSIS — Z720 Tobacco use: Secondary | ICD-10-CM

## 2014-02-14 DIAGNOSIS — Z125 Encounter for screening for malignant neoplasm of prostate: Secondary | ICD-10-CM

## 2014-02-14 DIAGNOSIS — F17200 Nicotine dependence, unspecified, uncomplicated: Secondary | ICD-10-CM

## 2014-02-14 DIAGNOSIS — E785 Hyperlipidemia, unspecified: Secondary | ICD-10-CM

## 2014-02-14 MED ORDER — LISINOPRIL-HYDROCHLOROTHIAZIDE 10-12.5 MG PO TABS: 1.0000 | ORAL_TABLET | Freq: Every day | ORAL | Status: DC

## 2014-02-14 MED ORDER — SIMVASTATIN 40 MG PO TABS: 40.0000 mg | ORAL_TABLET | Freq: Every day | ORAL | Status: DC

## 2014-02-14 NOTE — Progress Notes (Signed)
Subjective:    Patient ID: Andre Estrada, male    DOB: 1940-09-05, 73 y.o.   MRN: [id number]  Patient here today for chronic disease follow up. No acute complaint.    Hypertension This is a chronic problem. The current episode started more than 1 year ago. The problem is controlled. Pertinent negatives include no chest pain, palpitations or shortness of breath. Risk factors for coronary artery disease include dyslipidemia. Past treatments include ACE inhibitors and diuretics. The current treatment provides moderate improvement. Compliance problems include diet and exercise.   Hyperlipidemia This is a chronic problem. The current episode started more than 1 year ago. The problem is controlled. Recent lipid tests were reviewed and are normal. He has no history of diabetes, hypothyroidism or obesity. Pertinent negatives include no chest pain or shortness of breath. Current antihyperlipidemic treatment includes statins. The current treatment provides moderate improvement of lipids. Compliance problems include adherence to diet and adherence to exercise.  Risk factors for coronary artery disease include dyslipidemia and hypertension.      Review of Systems  Constitutional: Negative.   HENT: Negative.  Negative for sore throat.   Eyes: Negative.   Respiratory: Negative.  Negative for shortness of breath.   Cardiovascular: Negative.  Negative for chest pain and palpitations.  Gastrointestinal: Negative.   Endocrine: Negative.   Genitourinary: Negative.   Musculoskeletal: Negative.   Skin: Negative.   Allergic/Immunologic: Negative.   Neurological: Negative.   Hematological: Negative.   Psychiatric/Behavioral: Negative.   All other systems reviewed and are negative.      Objective:   Physical Exam  Constitutional: He is oriented to person, place, and time. He appears well-developed and well-nourished.  HENT:  Head: Normocephalic.  Right Ear: External ear normal.  Left Ear: External  ear normal.  Nose: Nose normal.  Mouth/Throat: Oropharynx is clear and moist.  Eyes: EOM are normal. Pupils are equal, round, and reactive to light.  Neck: Normal range of motion. Neck supple. No JVD present. No thyromegaly present.  Cardiovascular: Normal rate, regular rhythm, normal heart sounds and intact distal pulses.  Exam reveals no gallop and no friction rub.   No murmur heard. Pulmonary/Chest: Effort normal and breath sounds normal. No respiratory distress. He has no wheezes. He has no rales. He exhibits no tenderness.  Abdominal: Soft. Bowel sounds are normal. He exhibits no mass. There is no tenderness.  Genitourinary: Prostate normal and penis normal.  Musculoskeletal: Normal range of motion. He exhibits no edema.  Lymphadenopathy:    He has no cervical adenopathy.  Neurological: He is alert and oriented to person, place, and time. No cranial nerve deficit.  Skin: Skin is warm and dry.  Psychiatric: He has a normal mood and affect. His behavior is normal. Judgment and thought content normal.     BP 123/81 mmHg  Pulse 68  Temp(Src) 96.9 F (36.1 C) (Oral)  Ht _0  (1.803 m)  Wt 197 lb (89.359 kg)  BMI 27.49 kg/m2  EKG- NSR-Mary-Margaret Hassell Done, FNP Chest x ray- chronic bronchitic Kathlen Mody, FNP     Assessment & Plan:  1. Hyperlipemia Low fat diet - simvastatin (ZOCOR) 40 MG tablet; Take 1 tablet (40 mg total) by mouth at bedtime.  Dispense: 90 tablet; Refill: 1 - NMR, lipoprofile  2. Essential hypertension Do not add salt to diet - lisinopril-hydrochlorothiazide (PRINZIDE,ZESTORETIC) 10-12.5 MG per tablet; Take 1 tablet by mouth daily.  Dispense: 90 tablet; Refill: 1 - CMP14+EGFR - EKG 12-Lead  3. Prostate cancer screening -  PSA, total and free  4. Smoker Smoking cessation encouraged - DG Chest 2 View; Future    Labs pending Health maintenance reviewed Diet and exercise encouraged Continue all meds Follow up  In 3 month    [place], FNP

## 2014-02-15 LAB — CMP14+EGFR
A/G RATIO: 1.7 (ref 1.1–2.5)
ALT: 13 IU/L (ref 0–44)
AST: 18 IU/L (ref 0–40)
Albumin: 4.3 g/dL (ref 3.5–4.8)
Alkaline Phosphatase: 55 IU/L (ref 39–117)
BILIRUBIN TOTAL: 0.7 mg/dL (ref 0.0–1.2)
BUN / CREAT RATIO: 9 — AB (ref 10–22)
BUN: 10 mg/dL (ref 8–27)
CO2: 25 mmol/L (ref 18–29)
Calcium: 9.5 mg/dL (ref 8.6–10.2)
Chloride: 99 mmol/L (ref 97–108)
Creatinine, Ser: 1.11 mg/dL (ref 0.76–1.27)
GFR, EST AFRICAN AMERICAN: 76 mL/min/{1.73_m2} (ref >59)
GFR, EST NON AFRICAN AMERICAN: 66 mL/min/{1.73_m2} (ref >59)
GLOBULIN, TOTAL: 2.6 g/dL (ref 1.5–4.5)
Glucose: 95 mg/dL (ref 65–99)
POTASSIUM: 4 mmol/L (ref 3.5–5.2)
Sodium: 140 mmol/L (ref 134–144)
Total Protein: 6.9 g/dL (ref 6.0–8.5)

## 2014-02-15 LAB — NMR, LIPOPROFILE
Cholesterol: 123 mg/dL (ref 100–199)
HDL Cholesterol by NMR: 41 mg/dL (ref >39)
HDL Particle Number: 27 umol/L — ABNORMAL LOW (ref >=30.5)
LDL PARTICLE NUMBER: 917 nmol/L (ref <1000)
LDL SIZE: 20.8 nm (ref >20.5)
LDL-C: 64 mg/dL (ref 0–99)
LP-IR SCORE: 52 — AB (ref <=45)
Small LDL Particle Number: 367 nmol/L (ref <=527)
TRIGLYCERIDES BY NMR: 88 mg/dL (ref 0–149)

## 2014-02-15 LAB — PSA, TOTAL AND FREE
PSA FREE: 1.76 ng/mL
PSA, Free Pct: 27.1 %
PSA: 6.5 ng/mL — ABNORMAL HIGH (ref 0.0–4.0)

## 2014-02-16 ENCOUNTER — Other Ambulatory Visit: Payer: Self-pay

## 2014-02-16 DIAGNOSIS — R972 Elevated prostate specific antigen [PSA]: Secondary | ICD-10-CM

## 2014-03-28 DIAGNOSIS — R972 Elevated prostate specific antigen [PSA]: Secondary | ICD-10-CM | POA: Diagnosis not present

## 2014-03-28 DIAGNOSIS — R3911 Hesitancy of micturition: Secondary | ICD-10-CM | POA: Diagnosis not present

## 2014-03-28 DIAGNOSIS — R312 Other microscopic hematuria: Secondary | ICD-10-CM | POA: Diagnosis not present

## 2014-03-28 DIAGNOSIS — N403 Nodular prostate with lower urinary tract symptoms: Secondary | ICD-10-CM | POA: Diagnosis not present

## 2014-03-29 ENCOUNTER — Other Ambulatory Visit: Payer: Self-pay | Admitting: Urology

## 2014-03-29 DIAGNOSIS — R3129 Other microscopic hematuria: Secondary | ICD-10-CM

## 2014-04-05 ENCOUNTER — Ambulatory Visit (HOSPITAL_COMMUNITY)
Admission: RE | Admit: 2014-04-05 | Discharge: 2014-04-05 | Disposition: A | Discharge status: Home / Self Care | Payer: Medicare Other | Source: Ambulatory Visit | Attending: Urology | Admitting: Urology

## 2014-04-05 ENCOUNTER — Encounter (HOSPITAL_COMMUNITY): Payer: Self-pay

## 2014-04-05 DIAGNOSIS — R3129 Other microscopic hematuria: Secondary | ICD-10-CM

## 2014-04-05 DIAGNOSIS — R312 Other microscopic hematuria: Secondary | ICD-10-CM | POA: Insufficient documentation

## 2014-04-05 DIAGNOSIS — N2889 Other specified disorders of kidney and ureter: Secondary | ICD-10-CM | POA: Diagnosis not present

## 2014-04-05 DIAGNOSIS — N400 Enlarged prostate without lower urinary tract symptoms: Secondary | ICD-10-CM | POA: Diagnosis not present

## 2014-04-05 MED ORDER — SODIUM CHLORIDE 0.9 % IV SOLN
INTRAVENOUS | Status: AC
  Filled 2014-04-05: qty 250

## 2014-04-05 MED ORDER — IOHEXOL 300 MG/ML  SOLN
125.0000 mL | Freq: Once | INTRAMUSCULAR | Status: AC | PRN
  Administered 2014-04-05: 125 mL via INTRAVENOUS

## 2014-04-19 ENCOUNTER — Other Ambulatory Visit: Payer: Self-pay | Admitting: Urology

## 2014-04-19 DIAGNOSIS — N138 Other obstructive and reflux uropathy: Secondary | ICD-10-CM

## 2014-04-19 DIAGNOSIS — N403 Nodular prostate with lower urinary tract symptoms: Principal | ICD-10-CM

## 2014-05-18 ENCOUNTER — Encounter: Payer: Self-pay | Admitting: Nurse Practitioner

## 2014-05-18 ENCOUNTER — Ambulatory Visit (INDEPENDENT_AMBULATORY_CARE_PROVIDER_SITE_OTHER): Payer: Medicare Other | Admitting: Nurse Practitioner

## 2014-05-18 VITALS — BP 111/78 | HR 69 | Temp 96.8°F | Ht 71.0 in | Wt 201.0 lb

## 2014-05-18 DIAGNOSIS — Z125 Encounter for screening for malignant neoplasm of prostate: Secondary | ICD-10-CM

## 2014-05-18 DIAGNOSIS — I10 Essential (primary) hypertension: Secondary | ICD-10-CM | POA: Diagnosis not present

## 2014-05-18 DIAGNOSIS — E785 Hyperlipidemia, unspecified: Secondary | ICD-10-CM | POA: Diagnosis not present

## 2014-05-18 DIAGNOSIS — K432 Incisional hernia without obstruction or gangrene: Secondary | ICD-10-CM | POA: Insufficient documentation

## 2014-05-18 DIAGNOSIS — Z23 Encounter for immunization: Secondary | ICD-10-CM

## 2014-05-18 NOTE — Patient Instructions (Signed)

## 2014-05-18 NOTE — Progress Notes (Signed)
Subjective:    Patient ID: Andre Estrada, male    DOB: 1940-10-12, 74 y.o.   MRN: [id number]  Patient here today for chronic disease follow up. No acute complaint.   The patient reports he has a prostate biopsy scheduled for 06/10/2014 to evaluate his urinary symptoms and elevated PSA.    Hyperlipidemia This is a chronic problem. The current episode started more than 1 year ago. The problem is uncontrolled. Recent lipid tests were reviewed and are high. He has no history of diabetes or hypothyroidism. Factors aggravating his hyperlipidemia include smoking. Pertinent negatives include no chest pain, leg pain or shortness of breath. Current antihyperlipidemic treatment includes statins. The current treatment provides mild improvement of lipids. Compliance problems include adherence to diet.  Risk factors for coronary artery disease include hypertension, male sex and dyslipidemia.  Hypertension This is a chronic problem. The current episode started more than 1 year ago. The problem has been resolved since onset. The problem is controlled. Pertinent negatives include no blurred vision, chest pain, palpitations, peripheral edema or shortness of breath. Risk factors for coronary artery disease include dyslipidemia, male gender, family history and smoking/tobacco exposure. Past treatments include ACE inhibitors and diuretics. The current treatment provides moderate improvement. Compliance problems include diet and exercise.  There is no history of a thyroid problem.      Review of Systems  Constitutional: Negative.  Negative for activity change and fatigue.  HENT: Negative.  Negative for sore throat.   Eyes: Negative.  Negative for blurred vision and visual disturbance.  Respiratory: Negative.  Negative for chest tightness and shortness of breath.   Cardiovascular: Negative.  Negative for chest pain, palpitations and leg swelling.  Gastrointestinal: Negative.   Endocrine: Negative.   Genitourinary:  Positive for difficulty urinating (Intermittent difficulty starting stream). Negative for urgency and frequency.  Musculoskeletal: Negative.   Skin: Negative.   Allergic/Immunologic: Negative.   Neurological: Negative.   Hematological: Negative.   Psychiatric/Behavioral: Negative.   All other systems reviewed and are negative.      Objective:   Physical Exam  Constitutional: He is oriented to person, place, and time. He appears well-developed and well-nourished. No distress.  HENT:  Head: Normocephalic.  Right Ear: External ear normal.  Left Ear: External ear normal.  Mouth/Throat: Oropharynx is clear and moist.  Eyes: Pupils are equal, round, and reactive to light.  Neck: Normal range of motion. Neck supple. No JVD present. No thyromegaly present.  Cardiovascular: Normal rate, regular rhythm, normal heart sounds and intact distal pulses.   Pulmonary/Chest: Effort normal. No respiratory distress.  Abdominal: Soft. Bowel sounds are normal. He exhibits no mass. There is no tenderness. There is no guarding. A hernia (Large incisional hernia) is present.    Musculoskeletal: Normal range of motion. He exhibits no edema.  Neurological: He is alert and oriented to person, place, and time.  Skin: Skin is warm and dry.  Birthmark on left face around eye   Psychiatric: He has a normal mood and affect. His behavior is normal. Judgment and thought content normal.  Vitals reviewed.   BP 111/78 mmHg  Pulse 69  Temp(Src) 96.8 F (36 C) (Oral)  Ht '5\' 11"'  (1.803 m)  Wt 201 lb (91.173 kg)  BMI 28.05 kg/m2      Assessment & Plan:   1. Secondary hypertension, unspecified - CMP14+EGFR  2. Hyperlipemia - NMR, lipoprofile   prevnar 13 given today Labs pending Health maintenance reviewed Diet and exercise encouraged Continue all meds Follow up  In 3 months PRN.    Mary-Margaret Hassell Done, FNP

## 2014-05-19 LAB — CMP14+EGFR
A/G RATIO: 1.7 (ref 1.1–2.5)
ALT: 12 IU/L (ref 0–44)
AST: 19 IU/L (ref 0–40)
Albumin: 4.3 g/dL (ref 3.5–4.8)
Alkaline Phosphatase: 42 IU/L (ref 39–117)
BUN / CREAT RATIO: 10 (ref 10–22)
BUN: 11 mg/dL (ref 8–27)
Bilirubin Total: 0.4 mg/dL (ref 0.0–1.2)
CO2: 25 mmol/L (ref 18–29)
CREATININE: 1.12 mg/dL (ref 0.76–1.27)
Calcium: 9.4 mg/dL (ref 8.6–10.2)
Chloride: 99 mmol/L (ref 97–108)
GFR calc non Af Amer: 65 mL/min/{1.73_m2} (ref >59)
GFR, EST AFRICAN AMERICAN: 75 mL/min/{1.73_m2} (ref >59)
GLOBULIN, TOTAL: 2.5 g/dL (ref 1.5–4.5)
Glucose: 95 mg/dL (ref 65–99)
POTASSIUM: 4.1 mmol/L (ref 3.5–5.2)
Sodium: 140 mmol/L (ref 134–144)
Total Protein: 6.8 g/dL (ref 6.0–8.5)

## 2014-05-19 LAB — NMR, LIPOPROFILE
Cholesterol: 119 mg/dL (ref 100–199)
HDL Cholesterol by NMR: 41 mg/dL (ref >39)
HDL PARTICLE NUMBER: 28.8 umol/L — AB (ref >=30.5)
LDL PARTICLE NUMBER: 719 nmol/L (ref <1000)
LDL Size: 21.1 nm (ref >20.5)
LDL-C: 65 mg/dL (ref 0–99)
LP-IR Score: 34 (ref <=45)
Small LDL Particle Number: 273 nmol/L (ref <=527)
Triglycerides by NMR: 65 mg/dL (ref 0–149)

## 2014-06-03 ENCOUNTER — Other Ambulatory Visit: Payer: Self-pay | Admitting: Urology

## 2014-06-03 DIAGNOSIS — N403 Nodular prostate with lower urinary tract symptoms: Principal | ICD-10-CM

## 2014-06-03 DIAGNOSIS — N138 Other obstructive and reflux uropathy: Secondary | ICD-10-CM

## 2014-06-10 ENCOUNTER — Ambulatory Visit (HOSPITAL_COMMUNITY)
Admission: RE | Admit: 2014-06-10 | Discharge: 2014-06-10 | Disposition: A | Discharge status: Home / Self Care | Payer: Medicare Other | Source: Ambulatory Visit | Attending: Urology | Admitting: Urology

## 2014-06-10 ENCOUNTER — Ambulatory Visit (INDEPENDENT_AMBULATORY_CARE_PROVIDER_SITE_OTHER): Payer: Medicare Other | Admitting: Urology

## 2014-06-10 VITALS — BP 125/70 | HR 70 | Temp 97.7°F | Resp 20

## 2014-06-10 DIAGNOSIS — N403 Nodular prostate with lower urinary tract symptoms: Secondary | ICD-10-CM | POA: Diagnosis not present

## 2014-06-10 DIAGNOSIS — N139 Obstructive and reflux uropathy, unspecified: Secondary | ICD-10-CM | POA: Diagnosis present

## 2014-06-10 DIAGNOSIS — R972 Elevated prostate specific antigen [PSA]: Secondary | ICD-10-CM | POA: Diagnosis not present

## 2014-06-10 DIAGNOSIS — C61 Malignant neoplasm of prostate: Secondary | ICD-10-CM | POA: Diagnosis not present

## 2014-06-10 DIAGNOSIS — N138 Other obstructive and reflux uropathy: Secondary | ICD-10-CM

## 2014-06-10 MED ORDER — CEFTRIAXONE SODIUM 1 G IJ SOLR
INTRAMUSCULAR | Status: AC
  Administered 2014-06-10: 1000 mg via INTRAMUSCULAR
  Filled 2014-06-10: qty 10

## 2014-06-10 MED ORDER — CEFTRIAXONE SODIUM 1 G IJ SOLR
1.0000 g | Freq: Once | INTRAMUSCULAR | Status: AC
  Administered 2014-06-10: 1000 mg via INTRAMUSCULAR

## 2014-06-10 MED ORDER — LIDOCAINE HCL (PF) 2 % IJ SOLN
INTRAMUSCULAR | Status: AC
  Administered 2014-06-10: 10 mL
  Filled 2014-06-10: qty 10

## 2014-06-10 MED ORDER — LIDOCAINE HCL (PF) 1 % IJ SOLN
INTRAMUSCULAR | Status: AC
  Administered 2014-06-10: 5 mL
  Filled 2014-06-10: qty 5

## 2014-06-10 NOTE — Discharge Instructions (Signed)
Transrectal Ultrasound-Guided Biopsy °A transrectal ultrasound-guided biopsy is a procedure to take samples of tissue from your prostate. Ultrasound images are used to guide the procedure. It is usually done to check the prostate gland for cancer. °BEFORE THE PROCEDURE °· Do not eat or drink after midnight on the night before your procedure. °· Take medicines as your doctor tells you. °· Your doctor may have you stop taking some medicines 5-7 days before the procedure. °· You will be given an enema before your procedure. During an enema, a liquid is put into your butt (rectum) to clear out waste. °· You may have lab tests the day of your procedure. °· Make plans to have someone drive you home. °PROCEDURE °· You will be given medicine to help you relax before the procedure. An IV tube will be put into one of your veins. It will be used to give fluids and medicine. °· You will be given medicine to reduce the risk of infection (antibiotic). °· You will be placed on your side. °· A probe with gel will be put in your butt. This is used to take pictures of your prostate and the area around it. °· A medicine to numb the area is put into your prostate. °· A biopsy needle is then inserted and guided to your prostate. °· Samples of prostate tissue are taken. The needle is removed. °· The samples are sent to a lab to be checked. Results are usually back in 2-3 days. °AFTER THE PROCEDURE °· You will be taken to a room where you will be watched until you are doing okay. °· You may have some pain in the area around your butt. You will be given medicines for this. °· You may be able to go home the same day. Sometimes, an overnight stay in the hospital is needed. °Document Released: 01/23/2009 Document Revised: 02/09/2013 Document Reviewed: 09/23/2012 °[employer]® Patient Information ©2015 [employer]. This information is not intended to replace advice given to you by your health care provider. Make sure you discuss any questions  you have with your health care provider. ° °

## 2014-06-29 DIAGNOSIS — C61 Malignant neoplasm of prostate: Secondary | ICD-10-CM | POA: Diagnosis not present

## 2014-08-18 ENCOUNTER — Ambulatory Visit (INDEPENDENT_AMBULATORY_CARE_PROVIDER_SITE_OTHER): Payer: Medicare Other | Admitting: Nurse Practitioner

## 2014-08-18 ENCOUNTER — Encounter: Payer: Self-pay | Admitting: Nurse Practitioner

## 2014-08-18 DIAGNOSIS — Z125 Encounter for screening for malignant neoplasm of prostate: Secondary | ICD-10-CM | POA: Diagnosis not present

## 2014-08-18 DIAGNOSIS — I10 Essential (primary) hypertension: Secondary | ICD-10-CM

## 2014-08-18 DIAGNOSIS — E785 Hyperlipidemia, unspecified: Secondary | ICD-10-CM | POA: Diagnosis not present

## 2014-08-18 DIAGNOSIS — K432 Incisional hernia without obstruction or gangrene: Secondary | ICD-10-CM | POA: Diagnosis not present

## 2014-08-18 MED ORDER — SIMVASTATIN 40 MG PO TABS: 40.0000 mg | ORAL_TABLET | Freq: Every day | ORAL | Status: DC

## 2014-08-18 MED ORDER — LISINOPRIL-HYDROCHLOROTHIAZIDE 10-12.5 MG PO TABS: 1.0000 | ORAL_TABLET | Freq: Every day | ORAL | Status: DC

## 2014-08-18 NOTE — Addendum Note (Signed)
Addended by: Earlene Plater on: 08/18/2014 02:50 PM   Modules accepted: Orders

## 2014-08-18 NOTE — Progress Notes (Signed)
Subjective:    Patient ID: Andre Estrada, male    DOB: 1940-04-11, 74 y.o.   MRN: [id number]  Patient here today for chronic disease follow up. No acute complaint.    Hyperlipidemia This is a chronic problem. The current episode started more than 1 year ago. The problem is uncontrolled. Recent lipid tests were reviewed and are high. He has no history of diabetes or hypothyroidism. Factors aggravating his hyperlipidemia include smoking. Pertinent negatives include no chest pain, leg pain or shortness of breath. Current antihyperlipidemic treatment includes statins. The current treatment provides mild improvement of lipids. Compliance problems include adherence to diet.  Risk factors for coronary artery disease include hypertension, male sex and dyslipidemia.  Hypertension This is a chronic problem. The current episode started more than 1 year ago. The problem has been resolved since onset. The problem is controlled. Pertinent negatives include no blurred vision, chest pain, palpitations, peripheral edema or shortness of breath. Risk factors for coronary artery disease include dyslipidemia, male gender, family history and smoking/tobacco exposure. Past treatments include ACE inhibitors and diuretics. The current treatment provides moderate improvement. Compliance problems include diet and exercise.  There is no history of a thyroid problem.  large incisional hernia No obstruction- denies pain BPH Trouble starting stream at times- urologist did not give him any medication- has follow  Up in August  Review of Systems  Constitutional: Negative.  Negative for activity change and fatigue.  HENT: Negative.  Negative for sore throat.   Eyes: Negative.  Negative for blurred vision and visual disturbance.  Respiratory: Negative.  Negative for chest tightness and shortness of breath.   Cardiovascular: Negative.  Negative for chest pain, palpitations and leg swelling.  Gastrointestinal: Negative.     Endocrine: Negative.   Genitourinary: Positive for difficulty urinating (Intermittent difficulty starting stream). Negative for urgency and frequency.  Musculoskeletal: Negative.   Skin: Negative.   Allergic/Immunologic: Negative.   Neurological: Negative.   Hematological: Negative.   Psychiatric/Behavioral: Negative.   All other systems reviewed and are negative.      Objective:   Physical Exam  Constitutional: He is oriented to person, place, and time. He appears well-developed and well-nourished. No distress.  HENT:  Head: Normocephalic.  Right Ear: External ear normal.  Left Ear: External ear normal.  Mouth/Throat: Oropharynx is clear and moist.  Eyes: Pupils are equal, round, and reactive to light.  Neck: Normal range of motion. Neck supple. No JVD present. No thyromegaly present.  Cardiovascular: Normal rate, regular rhythm, normal heart sounds and intact distal pulses.   Pulmonary/Chest: Effort normal. No respiratory distress.  Abdominal: Soft. Bowel sounds are normal. He exhibits no mass. There is no tenderness. There is no guarding. A hernia (Large incisional hernia) is present.    Musculoskeletal: Normal range of motion. He exhibits no edema.  Neurological: He is alert and oriented to person, place, and time.  Skin: Skin is warm and dry.  Birthmark on left face around eye   Psychiatric: He has a normal mood and affect. His behavior is normal. Judgment and thought content normal.  Vitals reviewed.   BP 112/78 mmHg  Pulse 118  Temp(Src) 98.1 F (36.7 C) (Oral)  Ht '5\' 11"'  (1.803 m)  Wt 197 lb 6.4 oz (89.54 kg)  BMI 27.54 kg/m2      Assessment & Plan:   1. Prostate cancer screening Keep follow up appointment with Dr. Jeffie Pollock - PSA Total+%Free (Serial)  2. Incisional hernia, without obstruction or gangrene  3. Hyperlipemia Low  fta diet - simvastatin (ZOCOR) 40 MG tablet; Take 1 tablet (40 mg total) by mouth at bedtime.  Dispense: 90 tablet; Refill: 1 -  Lipid panel  4. Essential hypertension Do not add salt to diet - lisinopril-hydrochlorothiazide (PRINZIDE,ZESTORETIC) 10-12.5 MG per tablet; Take 1 tablet by mouth daily.  Dispense: 90 tablet; Refill: 1 - CMP14+EGFR    Labs pending Health maintenance reviewed Diet and exercise encouraged Continue all meds Follow up  In 3 month   [place], FNP

## 2014-08-18 NOTE — Patient Instructions (Signed)
Fat and Cholesterol Control Diet Fat and cholesterol levels in your blood and organs are influenced by your diet. High levels of fat and cholesterol may lead to diseases of the heart, small and large blood vessels, gallbladder, liver, and pancreas. CONTROLLING FAT AND CHOLESTEROL WITH DIET Although exercise and lifestyle factors are important, your diet is key. That is because certain foods are known to raise cholesterol and others to lower it. The goal is to balance foods for their effect on cholesterol and more importantly, to replace saturated and trans fat with other types of fat, such as monounsaturated fat, polyunsaturated fat, and omega-3 fatty acids. On average, a person should consume no more than 15 to 17 g of saturated fat daily. Saturated and trans fats are considered "bad" fats, and they will raise LDL cholesterol. Saturated fats are primarily found in animal products such as meats, butter, and cream. However, that does not mean you need to give up all your favorite foods. Today, there are good tasting, low-fat, low-cholesterol substitutes for most of the things you like to eat. Choose low-fat or nonfat alternatives. Choose round or loin cuts of red meat. These types of cuts are lowest in fat and cholesterol. Chicken (without the skin), fish, veal, and ground turkey breast are great choices. Eliminate fatty meats, such as hot dogs and salami. Even shellfish have little or no saturated fat. Have a 3 oz (85 g) portion when you eat lean meat, poultry, or fish. Trans fats are also called "partially hydrogenated oils." They are oils that have been scientifically manipulated so that they are solid at room temperature resulting in a longer shelf life and improved taste and texture of foods in which they are added. Trans fats are found in stick margarine, some tub margarines, cookies, crackers, and baked goods.  When baking and cooking, oils are a great substitute for butter. The monounsaturated oils are  especially beneficial since it is believed they lower LDL and raise HDL. The oils you should avoid entirely are saturated tropical oils, such as coconut and palm.  Remember to eat a lot from food groups that are naturally free of saturated and trans fat, including fish, fruit, vegetables, beans, grains (barley, rice, couscous, bulgur wheat), and pasta (without cream sauces).  IDENTIFYING FOODS THAT LOWER FAT AND CHOLESTEROL  Soluble fiber may lower your cholesterol. This type of fiber is found in fruits such as apples, vegetables such as broccoli, potatoes, and carrots, legumes such as beans, peas, and lentils, and grains such as barley. Foods fortified with plant sterols (phytosterol) may also lower cholesterol. You should eat at least 2 g per day of these foods for a cholesterol lowering effect.  Read package labels to identify low-saturated fats, trans fat free, and low-fat foods at the supermarket. Select cheeses that have only 2 to 3 g saturated fat per ounce. Use a heart-healthy tub margarine that is free of trans fats or partially hydrogenated oil. When buying baked goods (cookies, crackers), avoid partially hydrogenated oils. Breads and muffins should be made from whole grains (whole-wheat or whole oat flour, instead of "flour" or "enriched flour"). Buy non-creamy canned soups with reduced salt and no added fats.  FOOD PREPARATION TECHNIQUES  Never deep-fry. If you must fry, either stir-fry, which uses very little fat, or use non-stick cooking sprays. When possible, broil, bake, or roast meats, and steam vegetables. Instead of putting butter or margarine on vegetables, use lemon and herbs, applesauce, and cinnamon (for squash and sweet potatoes). Use nonfat   yogurt, salsa, and low-fat dressings for salads.  LOW-SATURATED FAT / LOW-FAT FOOD SUBSTITUTES Meats / Saturated Fat (g)  Avoid: Steak, marbled (3 oz/85 g) / 11 g  Choose: Steak, lean (3 oz/85 g) / 4 g  Avoid: Hamburger (3 oz/85 g) / 7  g  Choose: Hamburger, lean (3 oz/85 g) / 5 g  Avoid: Ham (3 oz/85 g) / 6 g  Choose: Ham, lean cut (3 oz/85 g) / 2.4 g  Avoid: Chicken, with skin, dark meat (3 oz/85 g) / 4 g  Choose: Chicken, skin removed, dark meat (3 oz/85 g) / 2 g  Avoid: Chicken, with skin, light meat (3 oz/85 g) / 2.5 g  Choose: Chicken, skin removed, light meat (3 oz/85 g) / 1 g Dairy / Saturated Fat (g)  Avoid: Whole milk (1 cup) / 5 g  Choose: Low-fat milk, 2% (1 cup) / 3 g  Choose: Low-fat milk, 1% (1 cup) / 1.5 g  Choose: Skim milk (1 cup) / 0.3 g  Avoid: Hard cheese (1 oz/28 g) / 6 g  Choose: Skim milk cheese (1 oz/28 g) / 2 to 3 g  Avoid: Cottage cheese, 4% fat (1 cup) / 6.5 g  Choose: Low-fat cottage cheese, 1% fat (1 cup) / 1.5 g  Avoid: Ice cream (1 cup) / 9 g  Choose: Sherbet (1 cup) / 2.5 g  Choose: Nonfat frozen yogurt (1 cup) / 0.3 g  Choose: Frozen fruit bar / trace  Avoid: Whipped cream (1 tbs) / 3.5 g  Choose: Nondairy whipped topping (1 tbs) / 1 g Condiments / Saturated Fat (g)  Avoid: Mayonnaise (1 tbs) / 2 g  Choose: Low-fat mayonnaise (1 tbs) / 1 g  Avoid: Butter (1 tbs) / 7 g  Choose: Extra light margarine (1 tbs) / 1 g  Avoid: Coconut oil (1 tbs) / 11.8 g  Choose: Olive oil (1 tbs) / 1.8 g  Choose: Corn oil (1 tbs) / 1.7 g  Choose: Safflower oil (1 tbs) / 1.2 g  Choose: Sunflower oil (1 tbs) / 1.4 g  Choose: Soybean oil (1 tbs) / 2.4 g  Choose: Canola oil (1 tbs) / 1 g Document Released: 02/04/2005 Document Revised: 06/01/2012 Document Reviewed: 05/05/2013 [employer] Patient Information 2015 [employer]. This information is not intended to replace advice given to you by your health care provider. Make sure you discuss any questions you have with your health care provider.  

## 2014-08-19 LAB — PSA, TOTAL AND FREE
PSA FREE: 2.37 ng/mL
PSA, Free Pct: 25.2 %
Prostate Specific Ag, Serum: 9.4 ng/mL — ABNORMAL HIGH (ref 0.0–4.0)

## 2014-08-19 LAB — CMP14+EGFR
ALK PHOS: 42 IU/L (ref 39–117)
ALT: 12 IU/L (ref 0–44)
AST: 23 IU/L (ref 0–40)
Albumin/Globulin Ratio: 1.9 (ref 1.1–2.5)
Albumin: 4.3 g/dL (ref 3.5–4.8)
BUN / CREAT RATIO: 12 (ref 10–22)
BUN: 15 mg/dL (ref 8–27)
Bilirubin Total: 0.6 mg/dL (ref 0.0–1.2)
CHLORIDE: 99 mmol/L (ref 97–108)
CO2: 22 mmol/L (ref 18–29)
CREATININE: 1.21 mg/dL (ref 0.76–1.27)
Calcium: 9.5 mg/dL (ref 8.6–10.2)
GFR calc non Af Amer: 59 mL/min/{1.73_m2} — ABNORMAL LOW (ref >59)
GFR, EST AFRICAN AMERICAN: 69 mL/min/{1.73_m2} (ref >59)
GLOBULIN, TOTAL: 2.3 g/dL (ref 1.5–4.5)
GLUCOSE: 84 mg/dL (ref 65–99)
POTASSIUM: 3.8 mmol/L (ref 3.5–5.2)
Sodium: 140 mmol/L (ref 134–144)
TOTAL PROTEIN: 6.6 g/dL (ref 6.0–8.5)

## 2014-08-19 LAB — LIPID PANEL
CHOL/HDL RATIO: 2.8 ratio (ref 0.0–5.0)
CHOLESTEROL TOTAL: 119 mg/dL (ref 100–199)
HDL: 42 mg/dL (ref >39)
LDL CALC: 60 mg/dL (ref 0–99)
TRIGLYCERIDES: 84 mg/dL (ref 0–149)
VLDL Cholesterol Cal: 17 mg/dL (ref 5–40)

## 2014-09-30 ENCOUNTER — Ambulatory Visit (INDEPENDENT_AMBULATORY_CARE_PROVIDER_SITE_OTHER): Payer: Medicare Other | Admitting: Urology

## 2014-09-30 DIAGNOSIS — R972 Elevated prostate specific antigen [PSA]: Secondary | ICD-10-CM | POA: Diagnosis not present

## 2014-09-30 DIAGNOSIS — Z8546 Personal history of malignant neoplasm of prostate: Secondary | ICD-10-CM | POA: Diagnosis not present

## 2014-09-30 DIAGNOSIS — N403 Nodular prostate with lower urinary tract symptoms: Secondary | ICD-10-CM | POA: Diagnosis not present

## 2014-09-30 DIAGNOSIS — R312 Other microscopic hematuria: Secondary | ICD-10-CM | POA: Diagnosis not present

## 2014-11-21 ENCOUNTER — Ambulatory Visit (INDEPENDENT_AMBULATORY_CARE_PROVIDER_SITE_OTHER): Payer: Medicare Other | Admitting: Nurse Practitioner

## 2014-11-21 ENCOUNTER — Encounter: Payer: Self-pay | Admitting: Nurse Practitioner

## 2014-11-21 VITALS — BP 120/81 | HR 77 | Temp 97.7°F | Ht 71.0 in | Wt 207.0 lb

## 2014-11-21 DIAGNOSIS — K432 Incisional hernia without obstruction or gangrene: Secondary | ICD-10-CM

## 2014-11-21 DIAGNOSIS — Z6828 Body mass index (BMI) 28.0-28.9, adult: Secondary | ICD-10-CM | POA: Diagnosis not present

## 2014-11-21 DIAGNOSIS — Z1212 Encounter for screening for malignant neoplasm of rectum: Secondary | ICD-10-CM

## 2014-11-21 DIAGNOSIS — E785 Hyperlipidemia, unspecified: Secondary | ICD-10-CM

## 2014-11-21 DIAGNOSIS — I10 Essential (primary) hypertension: Secondary | ICD-10-CM

## 2014-11-21 DIAGNOSIS — I159 Secondary hypertension, unspecified: Secondary | ICD-10-CM | POA: Diagnosis not present

## 2014-11-21 DIAGNOSIS — R972 Elevated prostate specific antigen [PSA]: Secondary | ICD-10-CM

## 2014-11-21 MED ORDER — LISINOPRIL-HYDROCHLOROTHIAZIDE 10-12.5 MG PO TABS: 1.0000 | ORAL_TABLET | Freq: Every day | ORAL | Status: DC

## 2014-11-21 MED ORDER — SIMVASTATIN 40 MG PO TABS: 40.0000 mg | ORAL_TABLET | Freq: Every day | ORAL | Status: DC

## 2014-11-21 NOTE — Progress Notes (Signed)
Subjective:    Patient ID: Andre Estrada, male    DOB: 1940-10-25, 74 y.o.   MRN: [id number]  Patient here today for chronic disease follow up. No acute complaint.   Patient had prostate bx in April- PSA has continued to rise- will repeat today and send results to urologist.  Hyperlipidemia This is a chronic problem. The current episode started more than 1 year ago. The problem is uncontrolled. Recent lipid tests were reviewed and are high. He has no history of diabetes or hypothyroidism. Factors aggravating his hyperlipidemia include smoking. Pertinent negatives include no chest pain, leg pain or shortness of breath. Current antihyperlipidemic treatment includes statins. The current treatment provides mild improvement of lipids. Compliance problems include adherence to diet.  Risk factors for coronary artery disease include hypertension, male sex and dyslipidemia.  Hypertension This is a chronic problem. The current episode started more than 1 year ago. The problem has been resolved since onset. The problem is controlled. Pertinent negatives include no blurred vision, chest pain, palpitations, peripheral edema or shortness of breath. Risk factors for coronary artery disease include dyslipidemia, male gender, family history and smoking/tobacco exposure. Past treatments include ACE inhibitors and diuretics. The current treatment provides moderate improvement. Compliance problems include diet and exercise.  There is no history of a thyroid problem.  incisional hernia No problems- does not have pain. Elevated PSA Sees urologist in November for recheck.     Review of Systems  Constitutional: Negative.  Negative for activity change and fatigue.  HENT: Negative.  Negative for sore throat.   Eyes: Negative.  Negative for blurred vision and visual disturbance.  Respiratory: Negative.  Negative for chest tightness and shortness of breath.   Cardiovascular: Negative.  Negative for chest pain,  palpitations and leg swelling.  Gastrointestinal: Negative.   Endocrine: Negative.   Genitourinary: Positive for difficulty urinating (Intermittent difficulty starting stream). Negative for urgency and frequency.  Musculoskeletal: Negative.   Skin: Negative.   Allergic/Immunologic: Negative.   Neurological: Negative.   Hematological: Negative.   Psychiatric/Behavioral: Negative.   All other systems reviewed and are negative.      Objective:   Physical Exam  Constitutional: He is oriented to person, place, and time. He appears well-developed and well-nourished. No distress.  HENT:  Head: Normocephalic.  Right Ear: External ear normal.  Left Ear: External ear normal.  Mouth/Throat: Oropharynx is clear and moist.  Eyes: Pupils are equal, round, and reactive to light.  Neck: Normal range of motion. Neck supple. No JVD present. No thyromegaly present.  Cardiovascular: Normal rate, regular rhythm, normal heart sounds and intact distal pulses.   Pulmonary/Chest: Effort normal. No respiratory distress.  Abdominal: Soft. Bowel sounds are normal. He exhibits no mass. There is no tenderness. There is no guarding. A hernia (Large incisional hernia) is present.    Musculoskeletal: Normal range of motion. He exhibits no edema.  Neurological: He is alert and oriented to person, place, and time.  Skin: Skin is warm and dry.  Birthmark on left face around eye   Psychiatric: He has a normal mood and affect. His behavior is normal. Judgment and thought content normal.  Vitals reviewed.   BP 120/81 mmHg  Pulse 77  Temp(Src) 97.7 F (36.5 C) (Oral)  Ht _0  (1.803 m)  Wt 207 lb (93.895 kg)  BMI 28.88 kg/m2      Assessment & Plan:  1. Secondary hypertension, unspecified Do not add salt to diet  2. Hyperlipemia Low fat diet - CMP14+EGFR -  Lipid panel - simvastatin (ZOCOR) 40 MG tablet; Take 1 tablet (40 mg total) by mouth at bedtime.  Dispense: 90 tablet; Refill: 1  3. Incisional  hernia, without obstruction or gangrene  4. BMI 28.0-28.9,adult Discussed diet and exercise for person with BMI >25 Will recheck weight in 3-6 months   5. Screening for malignant neoplasm of the rectum - Fecal occult blood, imunochemical; Future  6. Elevated PSA - PSA, total and free  7. Essential hypertension - lisinopril-hydrochlorothiazide (PRINZIDE,ZESTORETIC) 10-12.5 MG tablet; Take 1 tablet by mouth daily.  Dispense: 90 tablet; Refill: 1    Labs pending Health maintenance reviewed Diet and exercise encouraged Continue all meds Follow up  In 3 months   [place], FNP

## 2014-11-21 NOTE — Patient Instructions (Signed)
Fat and Cholesterol Control Diet Fat and cholesterol levels in your blood and organs are influenced by your diet. High levels of fat and cholesterol may lead to diseases of the heart, small and large blood vessels, gallbladder, liver, and pancreas. CONTROLLING FAT AND CHOLESTEROL WITH DIET Although exercise and lifestyle factors are important, your diet is key. That is because certain foods are known to raise cholesterol and others to lower it. The goal is to balance foods for their effect on cholesterol and more importantly, to replace saturated and trans fat with other types of fat, such as monounsaturated fat, polyunsaturated fat, and omega-3 fatty acids. On average, a person should consume no more than 15 to 17 g of saturated fat daily. Saturated and trans fats are considered "bad" fats, and they will raise LDL cholesterol. Saturated fats are primarily found in animal products such as meats, butter, and cream. However, that does not mean you need to give up all your favorite foods. Today, there are good tasting, low-fat, low-cholesterol substitutes for most of the things you like to eat. Choose low-fat or nonfat alternatives. Choose round or loin cuts of red meat. These types of cuts are lowest in fat and cholesterol. Chicken (without the skin), fish, veal, and ground turkey breast are great choices. Eliminate fatty meats, such as hot dogs and salami. Even shellfish have little or no saturated fat. Have a 3 oz (85 g) portion when you eat lean meat, poultry, or fish. Trans fats are also called "partially hydrogenated oils." They are oils that have been scientifically manipulated so that they are solid at room temperature resulting in a longer shelf life and improved taste and texture of foods in which they are added. Trans fats are found in stick margarine, some tub margarines, cookies, crackers, and baked goods.  When baking and cooking, oils are a great substitute for butter. The monounsaturated oils are  especially beneficial since it is believed they lower LDL and raise HDL. The oils you should avoid entirely are saturated tropical oils, such as coconut and palm.  Remember to eat a lot from food groups that are naturally free of saturated and trans fat, including fish, fruit, vegetables, beans, grains (barley, rice, couscous, bulgur wheat), and pasta (without cream sauces).  IDENTIFYING FOODS THAT LOWER FAT AND CHOLESTEROL  Soluble fiber may lower your cholesterol. This type of fiber is found in fruits such as apples, vegetables such as broccoli, potatoes, and carrots, legumes such as beans, peas, and lentils, and grains such as barley. Foods fortified with plant sterols (phytosterol) may also lower cholesterol. You should eat at least 2 g per day of these foods for a cholesterol lowering effect.  Read package labels to identify low-saturated fats, trans fat free, and low-fat foods at the supermarket. Select cheeses that have only 2 to 3 g saturated fat per ounce. Use a heart-healthy tub margarine that is free of trans fats or partially hydrogenated oil. When buying baked goods (cookies, crackers), avoid partially hydrogenated oils. Breads and muffins should be made from whole grains (whole-wheat or whole oat flour, instead of "flour" or "enriched flour"). Buy non-creamy canned soups with reduced salt and no added fats.  FOOD PREPARATION TECHNIQUES  Never deep-fry. If you must fry, either stir-fry, which uses very little fat, or use non-stick cooking sprays. When possible, broil, bake, or roast meats, and steam vegetables. Instead of putting butter or margarine on vegetables, use lemon and herbs, applesauce, and cinnamon (for squash and sweet potatoes). Use nonfat   yogurt, salsa, and low-fat dressings for salads.  LOW-SATURATED FAT / LOW-FAT FOOD SUBSTITUTES Meats / Saturated Fat (g)  Avoid: Steak, marbled (3 oz/85 g) / 11 g  Choose: Steak, lean (3 oz/85 g) / 4 g  Avoid: Hamburger (3 oz/85 g) / 7  g  Choose: Hamburger, lean (3 oz/85 g) / 5 g  Avoid: Ham (3 oz/85 g) / 6 g  Choose: Ham, lean cut (3 oz/85 g) / 2.4 g  Avoid: Chicken, with skin, dark meat (3 oz/85 g) / 4 g  Choose: Chicken, skin removed, dark meat (3 oz/85 g) / 2 g  Avoid: Chicken, with skin, light meat (3 oz/85 g) / 2.5 g  Choose: Chicken, skin removed, light meat (3 oz/85 g) / 1 g Dairy / Saturated Fat (g)  Avoid: Whole milk (1 cup) / 5 g  Choose: Low-fat milk, 2% (1 cup) / 3 g  Choose: Low-fat milk, 1% (1 cup) / 1.5 g  Choose: Skim milk (1 cup) / 0.3 g  Avoid: Hard cheese (1 oz/28 g) / 6 g  Choose: Skim milk cheese (1 oz/28 g) / 2 to 3 g  Avoid: Cottage cheese, 4% fat (1 cup) / 6.5 g  Choose: Low-fat cottage cheese, 1% fat (1 cup) / 1.5 g  Avoid: Ice cream (1 cup) / 9 g  Choose: Sherbet (1 cup) / 2.5 g  Choose: Nonfat frozen yogurt (1 cup) / 0.3 g  Choose: Frozen fruit bar / trace  Avoid: Whipped cream (1 tbs) / 3.5 g  Choose: Nondairy whipped topping (1 tbs) / 1 g Condiments / Saturated Fat (g)  Avoid: Mayonnaise (1 tbs) / 2 g  Choose: Low-fat mayonnaise (1 tbs) / 1 g  Avoid: Butter (1 tbs) / 7 g  Choose: Extra light margarine (1 tbs) / 1 g  Avoid: Coconut oil (1 tbs) / 11.8 g  Choose: Olive oil (1 tbs) / 1.8 g  Choose: Corn oil (1 tbs) / 1.7 g  Choose: Safflower oil (1 tbs) / 1.2 g  Choose: Sunflower oil (1 tbs) / 1.4 g  Choose: Soybean oil (1 tbs) / 2.4 g  Choose: Canola oil (1 tbs) / 1 g Document Released: 02/04/2005 Document Revised: 06/01/2012 Document Reviewed: 05/05/2013 [employer] Patient Information 2015 [employer]. This information is not intended to replace advice given to you by your health care provider. Make sure you discuss any questions you have with your health care provider.  

## 2014-11-22 LAB — PSA, TOTAL AND FREE
PSA FREE PCT: 38.7 %
PSA FREE: 1.78 ng/mL
Prostate Specific Ag, Serum: 4.6 ng/mL — ABNORMAL HIGH (ref 0.0–4.0)

## 2014-11-22 LAB — CMP14+EGFR
A/G RATIO: 1.8 (ref 1.1–2.5)
ALK PHOS: 38 IU/L — AB (ref 39–117)
ALT: 12 IU/L (ref 0–44)
AST: 17 IU/L (ref 0–40)
Albumin: 4.1 g/dL (ref 3.5–4.8)
BUN/Creatinine Ratio: 11 (ref 10–22)
BUN: 12 mg/dL (ref 8–27)
Bilirubin Total: 0.2 mg/dL (ref 0.0–1.2)
CO2: 26 mmol/L (ref 18–29)
Calcium: 9 mg/dL (ref 8.6–10.2)
Chloride: 104 mmol/L (ref 97–108)
Creatinine, Ser: 1.05 mg/dL (ref 0.76–1.27)
GFR calc Af Amer: 82 mL/min/{1.73_m2} (ref >59)
GFR calc non Af Amer: 71 mL/min/{1.73_m2} (ref >59)
GLOBULIN, TOTAL: 2.3 g/dL (ref 1.5–4.5)
Glucose: 83 mg/dL (ref 65–99)
Potassium: 3.9 mmol/L (ref 3.5–5.2)
SODIUM: 144 mmol/L (ref 134–144)
Total Protein: 6.4 g/dL (ref 6.0–8.5)

## 2014-11-22 LAB — LIPID PANEL
CHOLESTEROL TOTAL: 132 mg/dL (ref 100–199)
Chol/HDL Ratio: 3.6 ratio units (ref 0.0–5.0)
HDL: 37 mg/dL — ABNORMAL LOW (ref >39)
LDL Calculated: 56 mg/dL (ref 0–99)
TRIGLYCERIDES: 196 mg/dL — AB (ref 0–149)
VLDL Cholesterol Cal: 39 mg/dL (ref 5–40)

## 2014-11-23 ENCOUNTER — Other Ambulatory Visit: Payer: Medicare Other

## 2014-11-23 DIAGNOSIS — Z1212 Encounter for screening for malignant neoplasm of rectum: Secondary | ICD-10-CM

## 2014-11-26 LAB — FECAL OCCULT BLOOD, IMMUNOCHEMICAL: Fecal Occult Bld: NEGATIVE

## 2015-01-06 ENCOUNTER — Ambulatory Visit (INDEPENDENT_AMBULATORY_CARE_PROVIDER_SITE_OTHER): Payer: Medicare Other | Admitting: Urology

## 2015-01-06 DIAGNOSIS — C61 Malignant neoplasm of prostate: Secondary | ICD-10-CM | POA: Diagnosis not present

## 2015-01-06 DIAGNOSIS — R972 Elevated prostate specific antigen [PSA]: Secondary | ICD-10-CM | POA: Diagnosis not present

## 2015-01-06 DIAGNOSIS — R3911 Hesitancy of micturition: Secondary | ICD-10-CM | POA: Diagnosis not present

## 2015-02-23 ENCOUNTER — Ambulatory Visit (INDEPENDENT_AMBULATORY_CARE_PROVIDER_SITE_OTHER): Payer: Medicare Other | Admitting: Nurse Practitioner

## 2015-02-23 ENCOUNTER — Encounter: Payer: Self-pay | Admitting: Nurse Practitioner

## 2015-02-23 DIAGNOSIS — E785 Hyperlipidemia, unspecified: Secondary | ICD-10-CM

## 2015-02-23 DIAGNOSIS — Z6828 Body mass index (BMI) 28.0-28.9, adult: Secondary | ICD-10-CM

## 2015-02-23 DIAGNOSIS — I10 Essential (primary) hypertension: Secondary | ICD-10-CM

## 2015-02-23 DIAGNOSIS — K432 Incisional hernia without obstruction or gangrene: Secondary | ICD-10-CM

## 2015-02-23 MED ORDER — SIMVASTATIN 40 MG PO TABS: 40.0000 mg | ORAL_TABLET | Freq: Every day | ORAL | Status: DC

## 2015-02-23 MED ORDER — LISINOPRIL-HYDROCHLOROTHIAZIDE 10-12.5 MG PO TABS: 1.0000 | ORAL_TABLET | Freq: Every day | ORAL | Status: DC

## 2015-02-23 NOTE — Patient Instructions (Signed)

## 2015-02-23 NOTE — Progress Notes (Signed)
Subjective:    Patient ID: Andre Estrada, male    DOB: 04-Mar-1940, 75 y.o.   MRN: [id number]  Patient here today for chronic disease follow up. No acute complaint.    Hyperlipidemia This is a chronic problem. The current episode started more than 1 year ago. The problem is uncontrolled. Recent lipid tests were reviewed and are high. He has no history of diabetes or hypothyroidism. Factors aggravating his hyperlipidemia include smoking. Pertinent negatives include no chest pain, leg pain or shortness of breath. Current antihyperlipidemic treatment includes statins. The current treatment provides mild improvement of lipids. Compliance problems include adherence to diet.  Risk factors for coronary artery disease include hypertension, male sex and dyslipidemia.  Hypertension This is a chronic problem. The current episode started more than 1 year ago. The problem has been resolved since onset. The problem is controlled. Pertinent negatives include no blurred vision, chest pain, palpitations, peripheral edema or shortness of breath. Risk factors for coronary artery disease include dyslipidemia, male gender, family history and smoking/tobacco exposure. Past treatments include ACE inhibitors and diuretics. The current treatment provides moderate improvement. Compliance problems include diet and exercise.  There is no history of a thyroid problem.  incisional hernia No problems- does not have pain. Elevated PSA Sees urologist again in [place]- last results were sent to his urologist.     Review of Systems  Constitutional: Negative.  Negative for activity change and fatigue.  HENT: Negative.  Negative for sore throat.   Eyes: Negative.  Negative for blurred vision and visual disturbance.  Respiratory: Negative.  Negative for chest tightness and shortness of breath.   Cardiovascular: Negative.  Negative for chest pain, palpitations and leg swelling.  Gastrointestinal: Negative.   Endocrine: Negative.     Genitourinary: Positive for difficulty urinating (Intermittent difficulty starting stream). Negative for urgency and frequency.  Musculoskeletal: Negative.   Skin: Negative.   Allergic/Immunologic: Negative.   Neurological: Negative.   Hematological: Negative.   Psychiatric/Behavioral: Negative.   All other systems reviewed and are negative.      Objective:   Physical Exam  Constitutional: He is oriented to person, place, and time. He appears well-developed and well-nourished. No distress.  HENT:  Head: Normocephalic.  Right Ear: External ear normal.  Left Ear: External ear normal.  Mouth/Throat: Oropharynx is clear and moist.  Eyes: Pupils are equal, round, and reactive to light.  Neck: Normal range of motion. Neck supple. No JVD present. No thyromegaly present.  Cardiovascular: Normal rate, regular rhythm, normal heart sounds and intact distal pulses.   Pulmonary/Chest: Effort normal. No respiratory distress.  Abdominal: Soft. Bowel sounds are normal. He exhibits no mass. There is no tenderness. There is no guarding. A hernia (Large incisional hernia) is present.    Musculoskeletal: Normal range of motion. He exhibits no edema.  Neurological: He is alert and oriented to person, place, and time.  Skin: Skin is warm and dry.  Birthmark on left face around eye   Psychiatric: He has a normal mood and affect. His behavior is normal. Judgment and thought content normal.  Vitals reviewed.   BP 119/74 mmHg  Pulse 54  Temp(Src) 96.9 F (36.1 C) (Oral)  Ht _0  (1.803 m)  Wt 210 lb (95.255 kg)  BMI 29.30 kg/m2      Assessment & Plan:  1. Hyperlipemia Low fat diet - simvastatin (ZOCOR) 40 MG tablet; Take 1 tablet (40 mg total) by mouth at bedtime.  Dispense: 90 tablet; Refill: 1 - Lipid panel  2. Incisional hernia, without obstruction or gangrene Continue to watch  3. BMI 28.0-28.9,adult Discussed diet and exercise for person with BMI >25 Will recheck weight in 3-6  months  4. Essential hypertension Do not add salt to diet - lisinopril-hydrochlorothiazide (PRINZIDE,ZESTORETIC) 10-12.5 MG tablet; Take 1 tablet by mouth daily.  Dispense: 90 tablet; Refill: 1 - CMP14+EGFR    Labs pending Health maintenance reviewed Diet and exercise encouraged Continue all meds Follow up  In 3 months   [place], FNP

## 2015-02-24 LAB — LIPID PANEL
Chol/HDL Ratio: 2.9 ratio units (ref 0.0–5.0)
Cholesterol, Total: 135 mg/dL (ref 100–199)
HDL: 46 mg/dL (ref >39)
LDL Calculated: 72 mg/dL (ref 0–99)
Triglycerides: 85 mg/dL (ref 0–149)
VLDL Cholesterol Cal: 17 mg/dL (ref 5–40)

## 2015-02-24 LAB — CMP14+EGFR
A/G RATIO: 2 (ref 1.1–2.5)
ALBUMIN: 4.6 g/dL (ref 3.5–4.8)
ALT: 17 IU/L (ref 0–44)
AST: 20 IU/L (ref 0–40)
Alkaline Phosphatase: 39 IU/L (ref 39–117)
BUN / CREAT RATIO: 12 (ref 10–22)
BUN: 13 mg/dL (ref 8–27)
Bilirubin Total: 0.6 mg/dL (ref 0.0–1.2)
CALCIUM: 9.8 mg/dL (ref 8.6–10.2)
CO2: 21 mmol/L (ref 18–29)
Chloride: 99 mmol/L (ref 96–106)
Creatinine, Ser: 1.09 mg/dL (ref 0.76–1.27)
GFR calc Af Amer: 77 mL/min/{1.73_m2} (ref >59)
GFR calc non Af Amer: 67 mL/min/{1.73_m2} (ref >59)
GLOBULIN, TOTAL: 2.3 g/dL (ref 1.5–4.5)
Glucose: 99 mg/dL (ref 65–99)
POTASSIUM: 4.1 mmol/L (ref 3.5–5.2)
Sodium: 141 mmol/L (ref 134–144)
Total Protein: 6.9 g/dL (ref 6.0–8.5)

## 2015-02-26 IMAGING — CT CT ABD-PEL WO/W CM
2 of 10 series · 10 of 46 positions shown, 16 images · IV contrast (Omnipaque 300)
Comparison: No priors.

CLINICAL DATA: 73-year-old male with history of micro hematuria.
Difficulty urinating. History of melanoma.

EXAM:
CT ABDOMEN AND PELVIS WITHOUT AND WITH CONTRAST
TECHNIQUE: Multidetector CT imaging of the abdomen and pelvis was performed
following the standard protocol before and following the bolus
administration of intravenous contrast.
CONTRAST:  125mL OMNIPAQUE IOHEXOL 300 MG/ML  SOLN

[Series 3: hematuria mpr pre coronal 3.0 · coronal · non-contrast · 0.74mm/px · 2 of 99 slices shown, 3 images]
[im 33/99  soft-tissue]
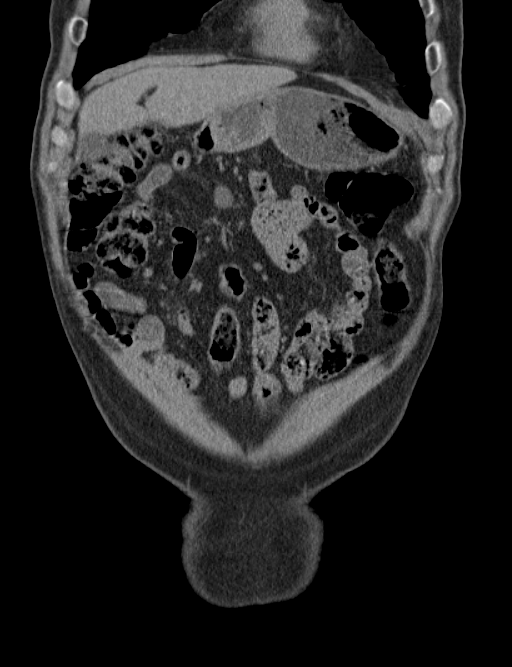
[im 33/99  bone]
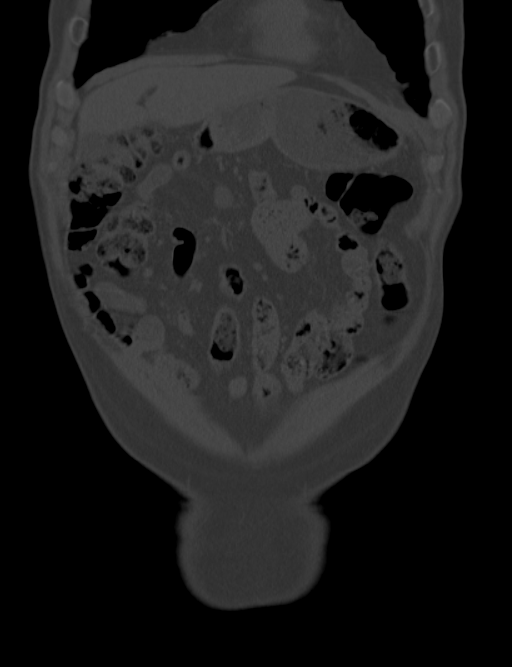
[im 66/99  soft-tissue]
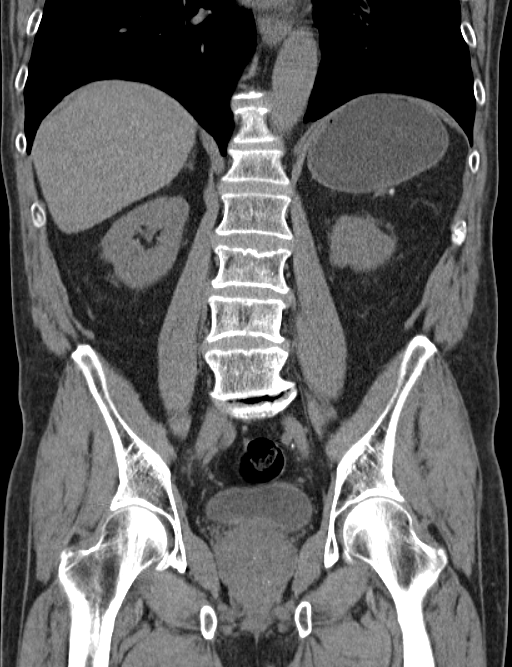

[Series 6: hematuria post axial 5.0 b40f · axial · 0.77mm/px · z∈[+578,+964]mm · 8 of 100 slices shown, 13 images]
[im 12/100  soft-tissue]
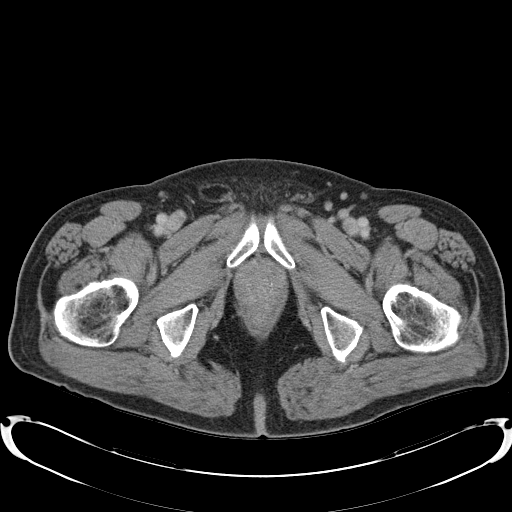
[im 12/100  bone]
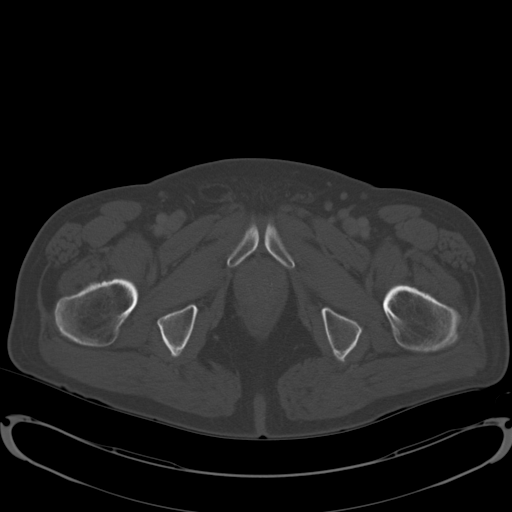
[im 23/100  soft-tissue]
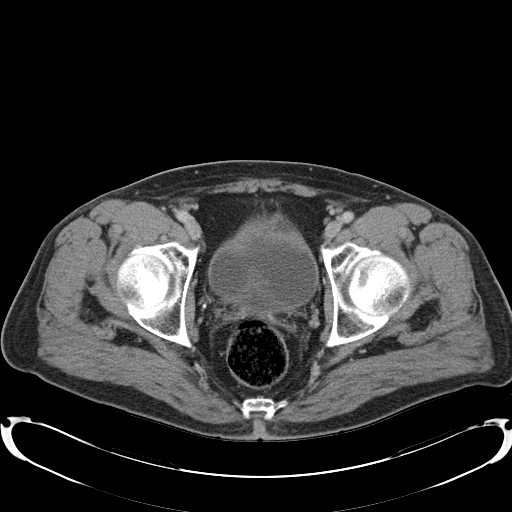
[im 34/100  soft-tissue]
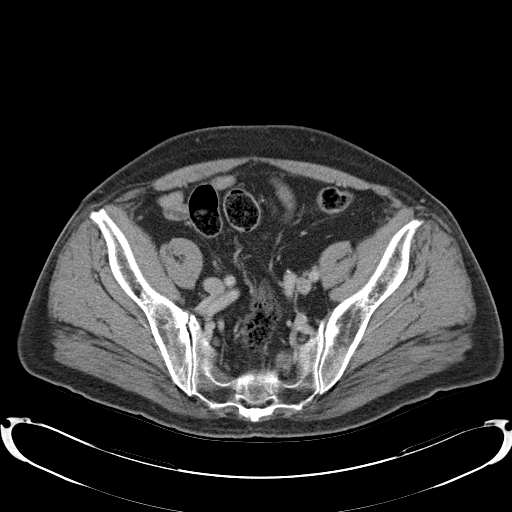
[im 45/100  soft-tissue]
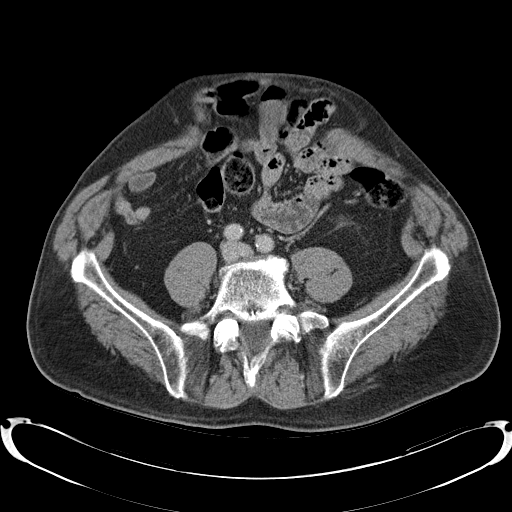
[im 56/100  soft-tissue]
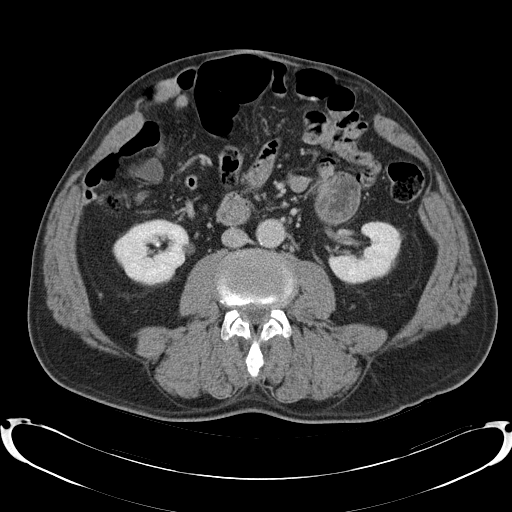
[im 56/100  lung]
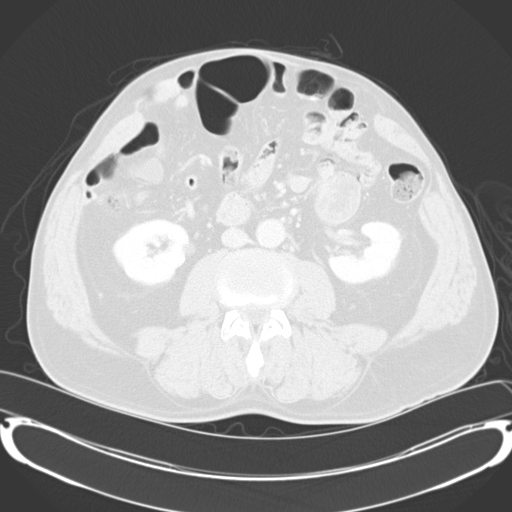
[im 67/100  soft-tissue]
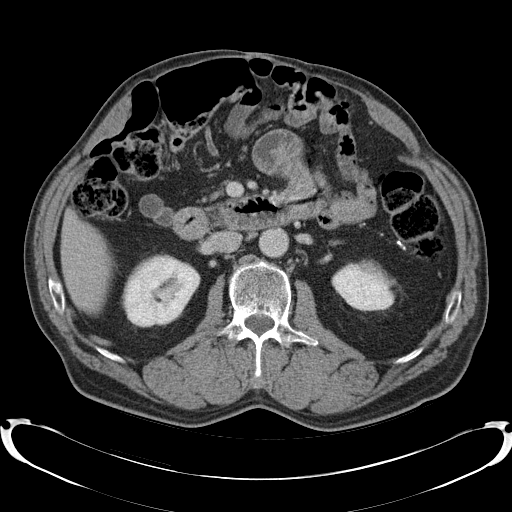
[im 67/100  lung]
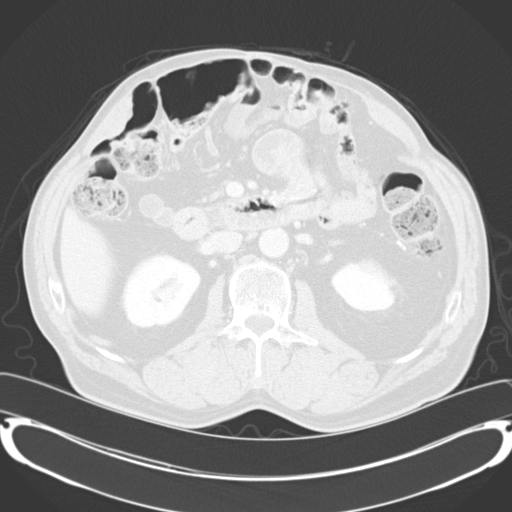
[im 78/100  soft-tissue]
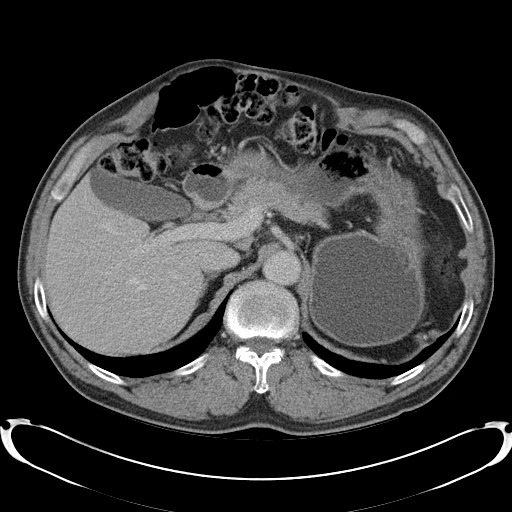
[im 78/100  lung]
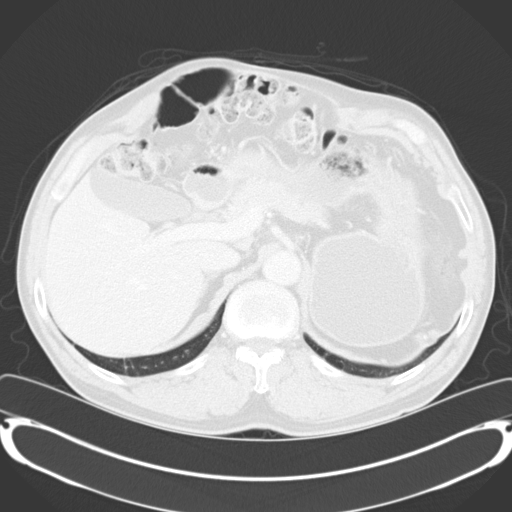
[im 89/100  soft-tissue]
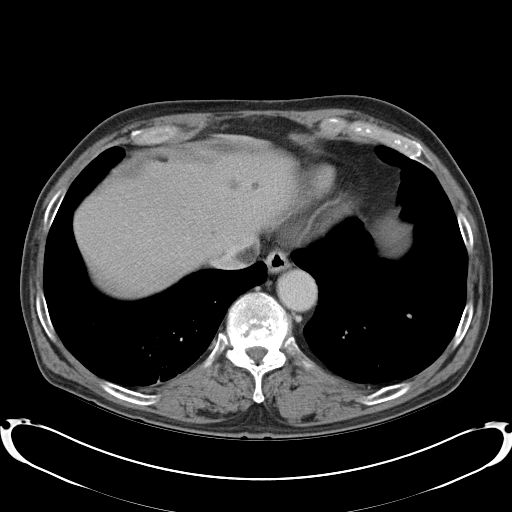
[im 89/100  lung]
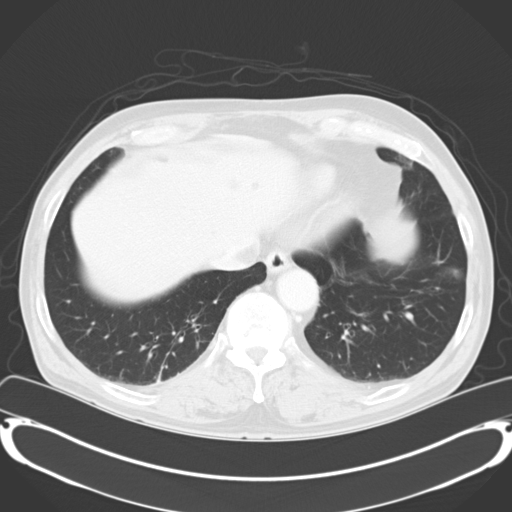

[10 of 46 positions shown; findings below may reference images not displayed]

FINDINGS: Lower chest: Linear scarring or subsegmental atelectasis in the
lower lobes of the lungs bilaterally. Atherosclerotic calcifications
in the right coronary artery.

Hepatobiliary: Multiple sub cm low-attenuation lesions in the left
lobe of the liver are too small to characterize, but are
statistically likely small cysts. 11 mm low attenuation lesion in
segment 2 is compatible with a small cyst. No other suspicious
appearing hepatic lesions. No intra or extrahepatic biliary ductal
dilatation. Gallbladder is normal in appearance.

Pancreas: Unremarkable.

Spleen: Spleen is not visualized, presumably surgically absent.

Adrenals/Urinary Tract: No abnormal calcifications are identified
within the collecting system of either kidney, along the course of
either ureter, or within the lumen of the urinary bladder. No
hydroureteronephrosis. Several tiny sub cm low-attenuation lesions
are noted in the kidneys bilaterally, which are too small to
definitively characterize, but are favored to represent tiny cysts.
The largest of these measures only 8 mm in the interpolar region of
the left kidney. Postcontrast delayed images demonstrate no definite
filling defect within the collecting system of either kidney, along
the course of either ureter, or within the lumen of the urinary
bladder to strongly suggest presence of a urothelial neoplasm at
this time. Mild circumferential thickening of the urinary bladder
wall. Bilateral adrenal glands are normal in appearance.

Stomach/Bowel: Normal appearance of the stomach. No pathologic
dilatation of small bowel or colon. Normal appendix.

Vascular/Lymphatic: Atherosclerosis throughout the abdominal and
pelvic vasculature, without evidence of aneurysm or dissection. No
lymphadenopathy noted in the abdomen or pelvis.

Reproductive: Prostate gland is enlarged and heterogeneous in
appearance with median lobe hypertrophy, measuring up to 5.7 x
cm. Seminal vesicles are unremarkable in appearance.

Other: No significant volume of ascites.  No pneumoperitoneum.

Musculoskeletal: There are no aggressive appearing lytic or blastic
lesions noted in the visualized portions of the skeleton. Chronic
appearing compression fracture at L1 with approximately 40% loss of
anterior vertebral body height. Old healed fracture of the posterior
aspect of the left tenth rib.
IMPRESSION: 1. There are multiple sub cm low-attenuation lesions in the kidneys
bilaterally which are too small to characterize by CT, but are
favored to represent tiny cysts. However, given the patient's
history of microhematuria, further characterization of these lesions
with MRI of the abdomen with and without IV gadolinium could be
performed if clinically appropriate.
2. Prostatomegaly with median lobe hypertrophy. This is associated
with some circumferential thickening of the urinary bladder wall
which may suggest bladder outlet obstruction.
3. Atherosclerosis.
4. Additional incidental findings, as above.

## 2015-06-08 ENCOUNTER — Encounter: Payer: Self-pay | Admitting: Nurse Practitioner

## 2015-06-08 ENCOUNTER — Ambulatory Visit (INDEPENDENT_AMBULATORY_CARE_PROVIDER_SITE_OTHER): Payer: Medicare Other | Admitting: Nurse Practitioner

## 2015-06-08 VITALS — BP 133/76 | HR 59 | Temp 96.7°F | Ht 71.0 in | Wt 212.6 lb

## 2015-06-08 DIAGNOSIS — Q825 Congenital non-neoplastic nevus: Secondary | ICD-10-CM | POA: Diagnosis not present

## 2015-06-08 DIAGNOSIS — K432 Incisional hernia without obstruction or gangrene: Secondary | ICD-10-CM

## 2015-06-08 DIAGNOSIS — Z6828 Body mass index (BMI) 28.0-28.9, adult: Secondary | ICD-10-CM

## 2015-06-08 DIAGNOSIS — I10 Essential (primary) hypertension: Secondary | ICD-10-CM | POA: Diagnosis not present

## 2015-06-08 DIAGNOSIS — E785 Hyperlipidemia, unspecified: Secondary | ICD-10-CM | POA: Diagnosis not present

## 2015-06-08 LAB — CMP14+EGFR
ALT: 23 IU/L (ref 0–44)
AST: 25 IU/L (ref 0–40)
Albumin/Globulin Ratio: 2.1 (ref 1.2–2.2)
Albumin: 4.5 g/dL (ref 3.5–4.8)
Alkaline Phosphatase: 32 IU/L — ABNORMAL LOW (ref 39–117)
BUN/Creatinine Ratio: 8 — ABNORMAL LOW (ref 10–24)
BUN: 9 mg/dL (ref 8–27)
Bilirubin Total: 0.8 mg/dL (ref 0.0–1.2)
CHLORIDE: 101 mmol/L (ref 96–106)
CO2: 24 mmol/L (ref 18–29)
Calcium: 9.5 mg/dL (ref 8.6–10.2)
Creatinine, Ser: 1.13 mg/dL (ref 0.76–1.27)
GFR calc Af Amer: 74 mL/min/{1.73_m2} (ref >59)
GFR calc non Af Amer: 64 mL/min/{1.73_m2} (ref >59)
GLUCOSE: 100 mg/dL — AB (ref 65–99)
Globulin, Total: 2.1 g/dL (ref 1.5–4.5)
POTASSIUM: 3.9 mmol/L (ref 3.5–5.2)
Sodium: 141 mmol/L (ref 134–144)
Total Protein: 6.6 g/dL (ref 6.0–8.5)

## 2015-06-08 LAB — LIPID PANEL
Chol/HDL Ratio: 2.8 ratio units (ref 0.0–5.0)
Cholesterol, Total: 121 mg/dL (ref 100–199)
HDL: 43 mg/dL (ref >39)
LDL Calculated: 60 mg/dL (ref 0–99)
Triglycerides: 90 mg/dL (ref 0–149)
VLDL CHOLESTEROL CAL: 18 mg/dL (ref 5–40)

## 2015-06-08 MED ORDER — SIMVASTATIN 40 MG PO TABS: 40.0000 mg | ORAL_TABLET | Freq: Every day | ORAL | Status: DC

## 2015-06-08 MED ORDER — LISINOPRIL-HYDROCHLOROTHIAZIDE 10-12.5 MG PO TABS: 1.0000 | ORAL_TABLET | Freq: Every day | ORAL | Status: DC

## 2015-06-08 NOTE — Progress Notes (Signed)
Subjective:    Patient ID: Andre Estrada, male    DOB: [birth year], 75 y.o.   MRN: [id number]  Patient here today for follow up of chronic medical problems.  Outpatient Encounter Prescriptions as of 06/08/2015  Medication Sig  . aspirin 81 MG tablet Take 81 mg by mouth daily.  Marland Kitchen docusate sodium (COLACE) 100 MG capsule Take 100 mg by mouth 2 (two) times daily.  Marland Kitchen lisinopril-hydrochlorothiazide (PRINZIDE,ZESTORETIC) 10-12.5 MG tablet Take 1 tablet by mouth daily.  . simvastatin (ZOCOR) 40 MG tablet Take 1 tablet (40 mg total) by mouth at bedtime.   No facility-administered encounter medications on file as of 06/08/2015.       Hyperlipidemia This is a chronic problem. The current episode started more than 1 year ago. The problem is uncontrolled. Recent lipid tests were reviewed and are high. He has no history of diabetes or hypothyroidism. Factors aggravating his hyperlipidemia include smoking. Pertinent negatives include no chest pain, leg pain or shortness of breath. Current antihyperlipidemic treatment includes statins. The current treatment provides mild improvement of lipids. Compliance problems include adherence to diet.  Risk factors for coronary artery disease include hypertension, male sex and dyslipidemia.  Hypertension This is a chronic problem. The current episode started more than 1 year ago. The problem has been resolved since onset. The problem is controlled. Pertinent negatives include no blurred vision, chest pain, palpitations, peripheral edema or shortness of breath. Risk factors for coronary artery disease include dyslipidemia, male gender, family history and smoking/tobacco exposure. Past treatments include ACE inhibitors and diuretics. The current treatment provides moderate improvement. Compliance problems include diet and exercise.  There is no history of a thyroid problem.  incisional hernia No problems- does not have pain. Elevated PSA Sees urologist again in [place]- last  results were sent to his urologist. birth [place] On left side of face- has not changed in  many years   Review of Systems  Constitutional: Negative.  Negative for activity change and fatigue.  HENT: Negative.  Negative for sore throat.   Eyes: Negative.  Negative for blurred vision and visual disturbance.  Respiratory: Negative.  Negative for chest tightness and shortness of breath.   Cardiovascular: Negative.  Negative for chest pain, palpitations and leg swelling.  Gastrointestinal: Negative.   Endocrine: Negative.   Genitourinary: Positive for difficulty urinating (Intermittent difficulty starting stream). Negative for urgency and frequency.  Musculoskeletal: Negative.   Skin: Negative.   Allergic/Immunologic: Negative.   Neurological: Negative.   Hematological: Negative.   Psychiatric/Behavioral: Negative.   All other systems reviewed and are negative.      Objective:   Physical Exam  Constitutional: He is oriented to person, place, and time. He appears well-developed and well-nourished. No distress.  HENT:  Head: Normocephalic.  Right Ear: External ear normal.  Left Ear: External ear normal.  Mouth/Throat: Oropharynx is clear and moist.  Eyes: Pupils are equal, round, and reactive to light.  Neck: Normal range of motion. Neck supple. No JVD present. No thyromegaly present.  Cardiovascular: Normal rate, regular rhythm, normal heart sounds and intact distal pulses.   Pulmonary/Chest: Effort normal. No respiratory distress.  Abdominal: Soft. Bowel sounds are normal. He exhibits no mass. There is no tenderness. There is no guarding. A hernia (Large incisional hernia) is present.    Musculoskeletal: Normal range of motion. He exhibits no edema.  Neurological: He is alert and oriented to person, place, and time.  Skin: Skin is warm and dry.  Birthmark on left face around eye  Psychiatric: He has a normal mood and affect. His behavior is normal. Judgment and thought content  normal.  Vitals reviewed.   BP 133/76 mmHg  Pulse 59  Temp(Src) 96.7 F (35.9 C) (Oral)  Ht _0  (1.803 m)  Wt 212 lb 9.6 oz (96.435 kg)  BMI 29.66 kg/m2      Assessment & Plan:  1. Essential hypertension Do not add salt to diet - lisinopril-hydrochlorothiazide (PRINZIDE,ZESTORETIC) 10-12.5 MG tablet; Take 1 tablet by mouth daily.  Dispense: 90 tablet; Refill: 1 - CMP14+EGFR  2. Hyperlipemia Low fat diet - simvastatin (ZOCOR) 40 MG tablet; Take 1 tablet (40 mg total) by mouth at bedtime.  Dispense: 90 tablet; Refill: 1 - Lipid panel  3. Incisional hernia, without obstruction or gangrene Seems to be enlatging- patient denies any problems- will call when and if wants something done  4. BMI 28.0-28.9,adult Discussed diet and exercise for person with BMI >25 Will recheck weight in 3-6 months  5. Birth mark    Labs pending Health maintenance reviewed Diet and exercise encouraged Continue all meds Follow up  In 6 months   [place], FNP

## 2015-06-08 NOTE — Patient Instructions (Signed)
Ventral Hernia A ventral hernia (also called an incisional hernia) is a hernia that occurs at the site of a previous surgical cut (incision) in the abdomen. The abdominal wall spans from your lower chest down to your pelvis. If the abdominal wall is weakened from a surgical incision, a hernia can occur. A hernia is a bulge of bowel or muscle tissue pushing out on the weakened part of the abdominal wall. Ventral hernias can get bigger from straining or lifting. Obese and older people are at higher risk for a ventral hernia. People who develop infections after surgery or require repeat incisions at the same site on the abdomen are also at increased risk. CAUSES  A ventral hernia occurs because of weakness in the abdominal wall at an incision site.  SYMPTOMS  Common symptoms include:  A visible bulge or lump on the abdominal wall.  Pain or tenderness around the lump.  Increased discomfort if you cough or make a sudden movement. If the hernia has blocked part of the intestine, a serious complication can occur (incarcerated or strangulated hernia). This can become a problem that requires emergency surgery because the blood flow to the blocked intestine may be cut off. Symptoms may include:  Feeling sick to your stomach (nauseous).  Throwing up (vomiting).  Stomach swelling (distention) or bloating.  Fever.  Rapid heartbeat. DIAGNOSIS  Your health care provider will take a medical history and perform a physical exam. Various tests may be ordered, such as:  Blood tests.  Urine tests.  Ultrasonography.  X-rays.  Computed tomography (CT). TREATMENT  Watchful waiting may be all that is needed for a smaller hernia that does not cause symptoms. Your health care provider may recommend the use of a supportive belt (truss) that helps to keep the abdominal wall intact. For larger hernias or those that cause pain, surgery to repair the hernia is usually recommended. If a hernia becomes  strangulated, emergency surgery needs to be done right away. HOME CARE INSTRUCTIONS  Avoid putting pressure or strain on the abdominal area.  Avoid heavy lifting.  Use good body positioning for physical tasks. Ask your health care provider about proper body positioning.  Use a supportive belt as directed by your health care provider.  Maintain a healthy weight.  Eat foods that are high in fiber, such as whole grains, fruits, and vegetables. Fiber helps prevent difficult bowel movements (constipation).  Drink enough fluids to keep your urine clear or pale yellow.  Follow up with your health care provider as directed. SEEK MEDICAL CARE IF:   Your hernia seems to be getting larger or more painful. SEEK IMMEDIATE MEDICAL CARE IF:   You have abdominal pain that is sudden and sharp.  Your pain becomes severe.  You have repeated vomiting.  You are sweating a lot.  You notice a rapid heartbeat.  You develop a fever. MAKE SURE YOU:   Understand these instructions.  Will watch your condition.  Will get help right away if you are not doing well or get worse.   This information is not intended to replace advice given to you by your health care provider. Make sure you discuss any questions you have with your health care provider.   Document Released: 01/22/2012 Document Revised: 02/25/2014 Document Reviewed: 01/22/2012 [employer] Interactive Patient Education 2016 [employer]  

## 2015-07-14 ENCOUNTER — Ambulatory Visit (INDEPENDENT_AMBULATORY_CARE_PROVIDER_SITE_OTHER): Payer: Medicare Other | Admitting: Urology

## 2015-07-14 DIAGNOSIS — R3911 Hesitancy of micturition: Secondary | ICD-10-CM | POA: Diagnosis not present

## 2015-07-14 DIAGNOSIS — C61 Malignant neoplasm of prostate: Secondary | ICD-10-CM | POA: Diagnosis not present

## 2015-07-14 DIAGNOSIS — N403 Nodular prostate with lower urinary tract symptoms: Secondary | ICD-10-CM | POA: Diagnosis not present

## 2015-07-14 DIAGNOSIS — R972 Elevated prostate specific antigen [PSA]: Secondary | ICD-10-CM | POA: Diagnosis not present

## 2015-12-11 ENCOUNTER — Encounter: Payer: Self-pay | Admitting: Nurse Practitioner

## 2015-12-11 ENCOUNTER — Ambulatory Visit (INDEPENDENT_AMBULATORY_CARE_PROVIDER_SITE_OTHER): Payer: Medicare Other | Admitting: Nurse Practitioner

## 2015-12-11 VITALS — BP 130/83 | HR 65 | Temp 97.0°F | Ht 71.0 in | Wt 214.0 lb

## 2015-12-11 DIAGNOSIS — Z1211 Encounter for screening for malignant neoplasm of colon: Secondary | ICD-10-CM

## 2015-12-11 DIAGNOSIS — I10 Essential (primary) hypertension: Secondary | ICD-10-CM | POA: Diagnosis not present

## 2015-12-11 DIAGNOSIS — Z6828 Body mass index (BMI) 28.0-28.9, adult: Secondary | ICD-10-CM | POA: Diagnosis not present

## 2015-12-11 DIAGNOSIS — E782 Mixed hyperlipidemia: Secondary | ICD-10-CM

## 2015-12-11 DIAGNOSIS — Z1212 Encounter for screening for malignant neoplasm of rectum: Secondary | ICD-10-CM | POA: Diagnosis not present

## 2015-12-11 DIAGNOSIS — Q825 Congenital non-neoplastic nevus: Secondary | ICD-10-CM | POA: Diagnosis not present

## 2015-12-11 DIAGNOSIS — K432 Incisional hernia without obstruction or gangrene: Secondary | ICD-10-CM | POA: Diagnosis not present

## 2015-12-11 LAB — CMP14+EGFR
ALT: 20 IU/L (ref 0–44)
AST: 21 IU/L (ref 0–40)
Albumin/Globulin Ratio: 2 (ref 1.2–2.2)
Albumin: 4.5 g/dL (ref 3.5–4.8)
Alkaline Phosphatase: 34 IU/L — ABNORMAL LOW (ref 39–117)
BUN/Creatinine Ratio: 11 (ref 10–24)
BUN: 13 mg/dL (ref 8–27)
Bilirubin Total: 0.9 mg/dL (ref 0.0–1.2)
CALCIUM: 9.7 mg/dL (ref 8.6–10.2)
CO2: 27 mmol/L (ref 18–29)
CREATININE: 1.21 mg/dL (ref 0.76–1.27)
Chloride: 101 mmol/L (ref 96–106)
GFR calc Af Amer: 68 mL/min/{1.73_m2} (ref >59)
GFR, EST NON AFRICAN AMERICAN: 59 mL/min/{1.73_m2} — AB (ref >59)
Globulin, Total: 2.2 g/dL (ref 1.5–4.5)
Glucose: 107 mg/dL — ABNORMAL HIGH (ref 65–99)
Potassium: 3.7 mmol/L (ref 3.5–5.2)
Sodium: 140 mmol/L (ref 134–144)
Total Protein: 6.7 g/dL (ref 6.0–8.5)

## 2015-12-11 LAB — LIPID PANEL
Chol/HDL Ratio: 3.3 ratio units (ref 0.0–5.0)
Cholesterol, Total: 140 mg/dL (ref 100–199)
HDL: 43 mg/dL (ref >39)
LDL Calculated: 77 mg/dL (ref 0–99)
Triglycerides: 99 mg/dL (ref 0–149)
VLDL CHOLESTEROL CAL: 20 mg/dL (ref 5–40)

## 2015-12-11 MED ORDER — SIMVASTATIN 40 MG PO TABS: 40.0000 mg | ORAL_TABLET | Freq: Every day | ORAL | 1 refills | Status: DC

## 2015-12-11 MED ORDER — LISINOPRIL-HYDROCHLOROTHIAZIDE 10-12.5 MG PO TABS: 1.0000 | ORAL_TABLET | Freq: Every day | ORAL | 1 refills | Status: DC

## 2015-12-11 NOTE — Patient Instructions (Signed)
Ventral Hernia A ventral hernia (also called an incisional hernia) is a hernia that occurs at the site of a previous surgical cut (incision) in the abdomen. The abdominal wall spans from your lower chest down to your pelvis. If the abdominal wall is weakened from a surgical incision, a hernia can occur. A hernia is a bulge of bowel or muscle tissue pushing out on the weakened part of the abdominal wall. Ventral hernias can get bigger from straining or lifting. Obese and older people are at higher risk for a ventral hernia. People who develop infections after surgery or require repeat incisions at the same site on the abdomen are also at increased risk. CAUSES  A ventral hernia occurs because of weakness in the abdominal wall at an incision site.  SYMPTOMS  Common symptoms include:  A visible bulge or lump on the abdominal wall.  Pain or tenderness around the lump.  Increased discomfort if you cough or make a sudden movement. If the hernia has blocked part of the intestine, a serious complication can occur (incarcerated or strangulated hernia). This can become a problem that requires emergency surgery because the blood flow to the blocked intestine may be cut off. Symptoms may include:  Feeling sick to your stomach (nauseous).  Throwing up (vomiting).  Stomach swelling (distention) or bloating.  Fever.  Rapid heartbeat. DIAGNOSIS  Your health care provider will take a medical history and perform a physical exam. Various tests may be ordered, such as:  Blood tests.  Urine tests.  Ultrasonography.  X-rays.  Computed tomography (CT). TREATMENT  Watchful waiting may be all that is needed for a smaller hernia that does not cause symptoms. Your health care provider may recommend the use of a supportive belt (truss) that helps to keep the abdominal wall intact. For larger hernias or those that cause pain, surgery to repair the hernia is usually recommended. If a hernia becomes  strangulated, emergency surgery needs to be done right away. HOME CARE INSTRUCTIONS  Avoid putting pressure or strain on the abdominal area.  Avoid heavy lifting.  Use good body positioning for physical tasks. Ask your health care provider about proper body positioning.  Use a supportive belt as directed by your health care provider.  Maintain a healthy weight.  Eat foods that are high in fiber, such as whole grains, fruits, and vegetables. Fiber helps prevent difficult bowel movements (constipation).  Drink enough fluids to keep your urine clear or pale yellow.  Follow up with your health care provider as directed. SEEK MEDICAL CARE IF:   Your hernia seems to be getting larger or more painful. SEEK IMMEDIATE MEDICAL CARE IF:   You have abdominal pain that is sudden and sharp.  Your pain becomes severe.  You have repeated vomiting.  You are sweating a lot.  You notice a rapid heartbeat.  You develop a fever. MAKE SURE YOU:   Understand these instructions.  Will watch your condition.  Will get help right away if you are not doing well or get worse.   This information is not intended to replace advice given to you by your health care provider. Make sure you discuss any questions you have with your health care provider.   Document Released: 01/22/2012 Document Revised: 02/25/2014 Document Reviewed: 01/22/2012 [employer] Interactive Patient Education 2016 [employer]  

## 2015-12-11 NOTE — Progress Notes (Signed)
Subjective:    Patient ID: Andre Estrada, male    DOB: [birth year], 75 y.o.   MRN: [id number]  Patient here today for follow up of chronic medical problems. No changes since last visit. No complaints today.  Outpatient Encounter Prescriptions as of 12/11/2015  Medication Sig  . aspirin 81 MG tablet Take 81 mg by mouth daily.  Marland Kitchen docusate sodium (COLACE) 100 MG capsule Take 100 mg by mouth 2 (two) times daily.  Marland Kitchen lisinopril-hydrochlorothiazide (PRINZIDE,ZESTORETIC) 10-12.5 MG tablet Take 1 tablet by mouth daily.  . simvastatin (ZOCOR) 40 MG tablet Take 1 tablet (40 mg total) by mouth at bedtime.   No facility-administered encounter medications on file as of 12/11/2015.      Hyperlipidemia  This is a chronic problem. The current episode started more than 1 year ago. The problem is uncontrolled. Recent lipid tests were reviewed and are high. He has no history of diabetes or hypothyroidism. Factors aggravating his hyperlipidemia include smoking. Pertinent negatives include no chest pain, leg pain or shortness of breath. Current antihyperlipidemic treatment includes statins. The current treatment provides mild improvement of lipids. Compliance problems include adherence to diet.  Risk factors for coronary artery disease include hypertension, male sex and dyslipidemia.  Hypertension  This is a chronic problem. The current episode started more than 1 year ago. The problem has been resolved since onset. The problem is controlled. Pertinent negatives include no blurred vision, chest pain, palpitations, peripheral edema or shortness of breath. Risk factors for coronary artery disease include dyslipidemia, male gender, family history and smoking/tobacco exposure. Past treatments include ACE inhibitors and diuretics. The current treatment provides moderate improvement. Compliance problems include diet and exercise.  There is no history of a thyroid problem.  incisional hernia No problems- does not have  pain. Elevated PSA Sees urologist again in [place]- last results were sent to his urologist. birth [place] On left side of face- has not changed in  many years   Review of Systems  Constitutional: Negative.  Negative for activity change and fatigue.  HENT: Negative.  Negative for sore throat.   Eyes: Negative.  Negative for blurred vision and visual disturbance.  Respiratory: Negative.  Negative for chest tightness and shortness of breath.   Cardiovascular: Negative.  Negative for chest pain, palpitations and leg swelling.  Gastrointestinal: Negative.   Endocrine: Negative.   Genitourinary: Positive for difficulty urinating (Intermittent difficulty starting stream). Negative for frequency and urgency.  Musculoskeletal: Negative.   Skin: Negative.   Allergic/Immunologic: Negative.   Neurological: Negative.   Hematological: Negative.   Psychiatric/Behavioral: Negative.   All other systems reviewed and are negative.      Objective:   Physical Exam  Constitutional: He is oriented to person, place, and time. He appears well-developed and well-nourished. No distress.  HENT:  Head: Normocephalic.  Right Ear: External ear normal.  Left Ear: External ear normal.  Mouth/Throat: Oropharynx is clear and moist.  Eyes: Pupils are equal, round, and reactive to light.  Neck: Normal range of motion. Neck supple. No JVD present. No thyromegaly present.  Cardiovascular: Normal rate, regular rhythm, normal heart sounds and intact distal pulses.   Pulmonary/Chest: Effort normal. No respiratory distress.  Abdominal: Soft. Bowel sounds are normal. He exhibits no mass. There is no tenderness. There is no guarding. A hernia (Large incisional hernia) is present.    Musculoskeletal: Normal range of motion. He exhibits no edema.  Neurological: He is alert and oriented to person, place, and time.  Skin: Skin is warm  and dry.  Birthmark on left face around eye   Psychiatric: He has a normal mood and affect.  His behavior is normal. Judgment and thought content normal.  Vitals reviewed.  BP 130/83   Pulse 65   Temp 97 F (36.1 C) (Oral)   Ht '5\' 11"'  (1.803 m)   Wt 214 lb (97.1 kg)   BMI 29.85 kg/m       Assessment & Plan:  1. Essential hypertension Do not add salt to diet - CMP14+EGFR - lisinopril-hydrochlorothiazide (PRINZIDE,ZESTORETIC) 10-12.5 MG tablet; Take 1 tablet by mouth daily.  Dispense: 90 tablet; Refill: 1  2. Birth mark Watch for changes  3. BMI 28.0-28.9,adult Discussed diet and exercise for person with BMI >25 Will recheck weight in 3-6 months  4. Mixed hyperlipidemia Low fat diet - Lipid panel - simvastatin (ZOCOR) 40 MG tablet; Take 1 tablet (40 mg total) by mouth at bedtime.  Dispense: 90 tablet; Refill: 1  5. Incisional hernia, without obstruction or gangrene If develops sudden pain - rto ASAP  6. Screening for colorectal cancer - Fecal occult blood, imunochemical; Future    Labs pending Health maintenance reviewed Diet and exercise encouraged Continue all meds Follow up  In 3 months   [place], FNP

## 2015-12-12 ENCOUNTER — Other Ambulatory Visit (INDEPENDENT_AMBULATORY_CARE_PROVIDER_SITE_OTHER): Payer: Medicare Other

## 2015-12-12 DIAGNOSIS — Z1211 Encounter for screening for malignant neoplasm of colon: Secondary | ICD-10-CM

## 2015-12-12 DIAGNOSIS — Z1212 Encounter for screening for malignant neoplasm of rectum: Principal | ICD-10-CM

## 2015-12-14 LAB — FECAL OCCULT BLOOD, IMMUNOCHEMICAL: Fecal Occult Bld: NEGATIVE

## 2016-01-02 DIAGNOSIS — Z029 Encounter for administrative examinations, unspecified: Secondary | ICD-10-CM

## 2016-01-24 ENCOUNTER — Other Ambulatory Visit: Payer: Medicare Other

## 2016-01-24 ENCOUNTER — Other Ambulatory Visit: Payer: Self-pay | Admitting: *Deleted

## 2016-01-24 DIAGNOSIS — C61 Malignant neoplasm of prostate: Secondary | ICD-10-CM

## 2016-01-25 LAB — PSA, TOTAL AND FREE
PSA, Free Pct: 25.5 %
PSA, Free: 1.96 ng/mL
Prostate Specific Ag, Serum: 7.7 ng/mL — ABNORMAL HIGH (ref 0.0–4.0)

## 2016-02-16 ENCOUNTER — Ambulatory Visit (INDEPENDENT_AMBULATORY_CARE_PROVIDER_SITE_OTHER): Payer: Medicare Other | Admitting: Urology

## 2016-02-16 DIAGNOSIS — C61 Malignant neoplasm of prostate: Secondary | ICD-10-CM | POA: Diagnosis not present

## 2016-06-11 ENCOUNTER — Ambulatory Visit (INDEPENDENT_AMBULATORY_CARE_PROVIDER_SITE_OTHER): Payer: Medicare Other | Admitting: Nurse Practitioner

## 2016-06-11 ENCOUNTER — Encounter: Payer: Self-pay | Admitting: Nurse Practitioner

## 2016-06-11 VITALS — BP 117/76 | HR 70 | Temp 96.8°F | Ht 71.0 in | Wt 214.0 lb

## 2016-06-11 DIAGNOSIS — K432 Incisional hernia without obstruction or gangrene: Secondary | ICD-10-CM | POA: Diagnosis not present

## 2016-06-11 DIAGNOSIS — I10 Essential (primary) hypertension: Secondary | ICD-10-CM | POA: Diagnosis not present

## 2016-06-11 DIAGNOSIS — Q825 Congenital non-neoplastic nevus: Secondary | ICD-10-CM

## 2016-06-11 DIAGNOSIS — E782 Mixed hyperlipidemia: Secondary | ICD-10-CM | POA: Diagnosis not present

## 2016-06-11 DIAGNOSIS — Z6828 Body mass index (BMI) 28.0-28.9, adult: Secondary | ICD-10-CM | POA: Diagnosis not present

## 2016-06-11 MED ORDER — SIMVASTATIN 40 MG PO TABS: 40.0000 mg | ORAL_TABLET | Freq: Every day | ORAL | 1 refills | Status: DC

## 2016-06-11 MED ORDER — LISINOPRIL-HYDROCHLOROTHIAZIDE 10-12.5 MG PO TABS: 1.0000 | ORAL_TABLET | Freq: Every day | ORAL | 1 refills | Status: DC

## 2016-06-11 NOTE — Patient Instructions (Signed)
Fall Prevention in the Home Falls can cause injuries. They can happen to people of all ages. There are many things you can do to make your home safe and to help prevent falls. What can I do on the outside of my home?  Regularly fix the edges of walkways and driveways and fix any cracks.  Remove anything that might make you trip as you walk through a door, such as a raised step or threshold.  Trim any bushes or trees on the path to your home.  Use bright outdoor lighting.  Clear any walking paths of anything that might make someone trip, such as rocks or tools.  Regularly check to see if handrails are loose or broken. Make sure that both sides of any steps have handrails.  Any raised decks and porches should have guardrails on the edges.  Have any leaves, snow, or ice cleared regularly.  Use sand or salt on walking paths during winter.  Clean up any spills in your garage right away. This includes oil or grease spills. What can I do in the bathroom?  Use night lights.  Install grab bars by the toilet and in the tub and shower. Do not use towel bars as grab bars.  Use non-skid mats or decals in the tub or shower.  If you need to sit down in the shower, use a plastic, non-slip stool.  Keep the floor dry. Clean up any water that spills on the floor as soon as it happens.  Remove soap buildup in the tub or shower regularly.  Attach bath mats securely with double-sided non-slip rug tape.  Do not have throw rugs and other things on the floor that can make you trip. What can I do in the bedroom?  Use night lights.  Make sure that you have a light by your bed that is easy to reach.  Do not use any sheets or blankets that are too big for your bed. They should not hang down onto the floor.  Have a firm chair that has side arms. You can use this for support while you get dressed.  Do not have throw rugs and other things on the floor that can make you trip. What can I do in the  kitchen?  Clean up any spills right away.  Avoid walking on wet floors.  Keep items that you use a lot in easy-to-reach places.  If you need to reach something above you, use a strong step stool that has a grab bar.  Keep electrical cords out of the way.  Do not use floor polish or wax that makes floors slippery. If you must use wax, use non-skid floor wax.  Do not have throw rugs and other things on the floor that can make you trip. What can I do with my stairs?  Do not leave any items on the stairs.  Make sure that there are handrails on both sides of the stairs and use them. Fix handrails that are broken or loose. Make sure that handrails are as long as the stairways.  Check any carpeting to make sure that it is firmly attached to the stairs. Fix any carpet that is loose or worn.  Avoid having throw rugs at the top or bottom of the stairs. If you do have throw rugs, attach them to the floor with carpet tape.  Make sure that you have a light switch at the top of the stairs and the bottom of the stairs. If you do   not have them, ask someone to add them for you. What else can I do to help prevent falls?  Wear shoes that:  Do not have high heels.  Have rubber bottoms.  Are comfortable and fit you well.  Are closed at the toe. Do not wear sandals.  If you use a stepladder:  Make sure that it is fully opened. Do not climb a closed stepladder.  Make sure that both sides of the stepladder are locked into place.  Ask someone to hold it for you, if possible.  Clearly mark and make sure that you can see:  Any grab bars or handrails.  First and last steps.  Where the edge of each step is.  Use tools that help you move around (mobility aids) if they are needed. These include:  Canes.  Walkers.  Scooters.  Crutches.  Turn on the lights when you go into a dark area. Replace any light bulbs as soon as they burn out.  Set up your furniture so you have a clear path.  Avoid moving your furniture around.  If any of your floors are uneven, fix them.  If there are any pets around you, be aware of where they are.  Review your medicines with your doctor. Some medicines can make you feel dizzy. This can increase your chance of falling. Ask your doctor what other things that you can do to help prevent falls. This information is not intended to replace advice given to you by your health care provider. Make sure you discuss any questions you have with your health care provider. Document Released: 12/01/2008 Document Revised: 07/13/2015 Document Reviewed: 03/11/2014 Elsevier Interactive Patient Education  2017 Elsevier Inc.  

## 2016-06-11 NOTE — Progress Notes (Signed)
Subjective:    Patient ID: Andre Estrada, male    DOB: 1940-11-22, 76 y.o.   MRN: [id number]  HPI  Daivd Fredericksen is here today for follow up of chronic medical problem.  Outpatient Encounter Prescriptions as of 06/11/2016  Medication Sig  . aspirin 81 MG tablet Take 81 mg by mouth daily.  Marland Kitchen docusate sodium (COLACE) 100 MG capsule Take 100 mg by mouth 2 (two) times daily.  Marland Kitchen lisinopril-hydrochlorothiazide (PRINZIDE,ZESTORETIC) 10-12.5 MG tablet Take 1 tablet by mouth daily.  . simvastatin (ZOCOR) 40 MG tablet Take 1 tablet (40 mg total) by mouth at bedtime.   No facility-administered encounter medications on file as of 06/11/2016.     1. Essential hypertension  No C/o chest pain, SOB or HA- does not check blood pressure at home  2. BMI 28.0-28.9,adult  No recent weight gains or weight loss  3. Mixed hyperlipidemia  Does not watch diet at home  4. Incisional hernia, without obstruction or gangrene  No problems  5. Birth mark  No change to birth mark    New complaints: No new complaints today       Review of Systems  Constitutional: Negative.  Negative for diaphoresis.  HENT: Negative.   Eyes: Negative for pain.  Respiratory: Negative.  Negative for shortness of breath.   Cardiovascular: Negative.  Negative for chest pain, palpitations and leg swelling.  Gastrointestinal: Negative for abdominal pain.  Endocrine: Negative for polydipsia.  Genitourinary: Negative.   Skin: Negative for rash.  Neurological: Negative.  Negative for dizziness, weakness and headaches.  Hematological: Does not bruise/bleed easily.  Psychiatric/Behavioral: Negative.   All other systems reviewed and are negative.      Objective:   Physical Exam  Constitutional: He is oriented to person, place, and time. He appears well-developed and well-nourished.  HENT:  Head: Normocephalic.  Right Ear: External ear normal.  Left Ear: External ear normal.  Nose: Nose normal.  Mouth/Throat:  Oropharynx is clear and moist.  Eyes: EOM are normal. Pupils are equal, round, and reactive to light.  Neck: Normal range of motion. Neck supple. No JVD present. No thyromegaly present.  Cardiovascular: Normal rate, regular rhythm, normal heart sounds and intact distal pulses.  Exam reveals no gallop and no friction rub.   No murmur heard. Pulmonary/Chest: Effort normal and breath sounds normal. No respiratory distress. He has no wheezes. He has no rales. He exhibits no tenderness.  Abdominal: Soft. Bowel sounds are normal. He exhibits no mass. There is no tenderness.  Large non incarcerated incisional hernia- no pain on palpation.  Genitourinary: Prostate normal and penis normal.  Musculoskeletal: Normal range of motion. He exhibits no edema.  Lymphadenopathy:    He has no cervical adenopathy.  Neurological: He is alert and oriented to person, place, and time. No cranial nerve deficit.  Skin: Skin is warm and dry.  No change in birth mark on left side of face  Psychiatric: He has a normal mood and affect. His behavior is normal. Judgment and thought content normal.    BP 117/76   Pulse 70   Temp (!) 96.8 F (36 C) (Oral)   Ht 5' 11" (1.803 m)   Wt 214 lb (97.1 kg)   BMI 29.85 kg/m       Assessment & Plan:  1. Essential hypertension Do not add salt to deit - CMP14+EGFR - lisinopril-hydrochlorothiazide (PRINZIDE,ZESTORETIC) 10-12.5 MG tablet; Take 1 tablet by mouth daily.  Dispense: 90 tablet; Refill: 1  2. BMI 28.0-28.9,adult  Discussed diet and exercise for person with BMI >25 Will recheck weight in 3-6 months  3. Mixed hyperlipidemia Low fta diet - Lipid panel - simvastatin (ZOCOR) 40 MG tablet; Take 1 tablet (40 mg total) by mouth at bedtime.  Dispense: 90 tablet; Refill: 1  4. Incisional hernia, without obstruction or gangrene RTO if develops pain  5. Birth mark Watch for changes    Labs pending Health maintenance reviewed Diet and exercise  encouraged Continue all meds Follow up  In 3 months   [place], FNP

## 2016-06-12 LAB — CMP14+EGFR
A/G RATIO: 1.9 (ref 1.2–2.2)
ALT: 24 IU/L (ref 0–44)
AST: 27 IU/L (ref 0–40)
Albumin: 4.5 g/dL (ref 3.5–4.8)
Alkaline Phosphatase: 36 IU/L — ABNORMAL LOW (ref 39–117)
BUN/Creatinine Ratio: 15 (ref 10–24)
BUN: 17 mg/dL (ref 8–27)
Bilirubin Total: 0.9 mg/dL (ref 0.0–1.2)
CO2: 25 mmol/L (ref 18–29)
Calcium: 10 mg/dL (ref 8.6–10.2)
Chloride: 98 mmol/L (ref 96–106)
Creatinine, Ser: 1.11 mg/dL (ref 0.76–1.27)
GFR calc Af Amer: 75 mL/min/{1.73_m2} (ref >59)
GFR, EST NON AFRICAN AMERICAN: 65 mL/min/{1.73_m2} (ref >59)
GLOBULIN, TOTAL: 2.4 g/dL (ref 1.5–4.5)
Glucose: 104 mg/dL — ABNORMAL HIGH (ref 65–99)
POTASSIUM: 4 mmol/L (ref 3.5–5.2)
SODIUM: 140 mmol/L (ref 134–144)
Total Protein: 6.9 g/dL (ref 6.0–8.5)

## 2016-06-12 LAB — LIPID PANEL
CHOL/HDL RATIO: 3.1 ratio (ref 0.0–5.0)
Cholesterol, Total: 146 mg/dL (ref 100–199)
HDL: 47 mg/dL (ref >39)
LDL CALC: 75 mg/dL (ref 0–99)
Triglycerides: 120 mg/dL (ref 0–149)
VLDL Cholesterol Cal: 24 mg/dL (ref 5–40)

## 2016-08-05 ENCOUNTER — Other Ambulatory Visit: Payer: Medicare Other

## 2016-08-05 DIAGNOSIS — C61 Malignant neoplasm of prostate: Secondary | ICD-10-CM | POA: Diagnosis not present

## 2016-08-05 NOTE — Progress Notes (Signed)
Pt sees [facility]MM Pt brought order in from Dr Jeffie Pollock

## 2016-08-06 LAB — PSA, TOTAL AND FREE
PSA, Free Pct: 34.7 %
PSA, Free: 1.84 ng/mL
Prostate Specific Ag, Serum: 5.3 ng/mL — ABNORMAL HIGH (ref 0.0–4.0)

## 2016-09-10 ENCOUNTER — Encounter: Payer: Self-pay | Admitting: Nurse Practitioner

## 2016-09-10 ENCOUNTER — Ambulatory Visit (INDEPENDENT_AMBULATORY_CARE_PROVIDER_SITE_OTHER): Payer: Medicare Other | Admitting: Nurse Practitioner

## 2016-09-10 VITALS — BP 116/75 | HR 65 | Temp 97.2°F | Ht 71.0 in | Wt 210.0 lb

## 2016-09-10 DIAGNOSIS — K432 Incisional hernia without obstruction or gangrene: Secondary | ICD-10-CM

## 2016-09-10 DIAGNOSIS — Z6828 Body mass index (BMI) 28.0-28.9, adult: Secondary | ICD-10-CM

## 2016-09-10 DIAGNOSIS — I10 Essential (primary) hypertension: Secondary | ICD-10-CM

## 2016-09-10 DIAGNOSIS — E782 Mixed hyperlipidemia: Secondary | ICD-10-CM

## 2016-09-10 MED ORDER — LISINOPRIL-HYDROCHLOROTHIAZIDE 10-12.5 MG PO TABS: 1.0000 | ORAL_TABLET | Freq: Every day | ORAL | 1 refills | Status: DC

## 2016-09-10 MED ORDER — SIMVASTATIN 40 MG PO TABS: 40.0000 mg | ORAL_TABLET | Freq: Every day | ORAL | 1 refills | Status: DC

## 2016-09-10 NOTE — Progress Notes (Signed)
Subjective:    Patient ID: Andre Estrada, male    DOB: June 01, 1940, 76 y.o.   MRN: [id number]  HPI  Andre Estrada is here today for follow up of chronic medical problem.  Outpatient Encounter Prescriptions as of 09/10/2016  Medication Sig  . aspirin 81 MG tablet Take 81 mg by mouth daily.  Marland Kitchen docusate sodium (COLACE) 100 MG capsule Take 100 mg by mouth 2 (two) times daily.  Marland Kitchen lisinopril-hydrochlorothiazide (PRINZIDE,ZESTORETIC) 10-12.5 MG tablet Take 1 tablet by mouth daily.  . simvastatin (ZOCOR) 40 MG tablet Take 1 tablet (40 mg total) by mouth at bedtime.   No facility-administered encounter medications on file as of 09/10/2016.     1. Essential hypertension  No c/o chest pain , sob or headache. Does not check blood pressure at home  2. Incisional hernia, without obstruction or gangrene  Large incisional hernia of abdomen that does not cause any discomfort  3. Mixed hyperlipidemia  Does not watch diet  4. BMI 28.0-28.9,adult  No recent weight changes    New complaints: none  Social history: Having prostate surgery in a few weeks. Lives with his son. Wife passed away many years ago.    Review of Systems  Constitutional: Negative for activity change and appetite change.  HENT: Negative.   Eyes: Negative for pain.  Respiratory: Negative for shortness of breath.   Cardiovascular: Negative for chest pain, palpitations and leg swelling.  Gastrointestinal: Negative for abdominal pain.  Endocrine: Negative for polydipsia.  Genitourinary: Negative.   Skin: Negative for rash.  Neurological: Negative for dizziness, weakness and headaches.  Hematological: Does not bruise/bleed easily.  Psychiatric/Behavioral: Negative.   All other systems reviewed and are negative.      Objective:   Physical Exam  Constitutional: He is oriented to person, place, and time. He appears well-developed and well-nourished.  HENT:  Head: Normocephalic.  Right Ear: External ear normal.    Left Ear: External ear normal.  Nose: Nose normal.  Mouth/Throat: Oropharynx is clear and moist.  Eyes: Pupils are equal, round, and reactive to light. EOM are normal.  Neck: Normal range of motion. Neck supple. No JVD present. No thyromegaly present.  Cardiovascular: Normal rate, regular rhythm, normal heart sounds and intact distal pulses.  Exam reveals no gallop and no friction rub.   No murmur heard. Pulmonary/Chest: Effort normal and breath sounds normal. No respiratory distress. He has no wheezes. He has no rales. He exhibits no tenderness.  Abdominal: Soft. Bowel sounds are normal. He exhibits no mass. There is no tenderness.  Large soft nontender incisional hernia  Musculoskeletal: Normal range of motion. He exhibits no edema.  Lymphadenopathy:    He has no cervical adenopathy.  Neurological: He is alert and oriented to person, place, and time. No cranial nerve deficit.  Skin: Skin is warm and dry.  Large red bumpy birth mark on left side of face  Psychiatric: He has a normal mood and affect. His behavior is normal. Judgment and thought content normal.   BP 116/75   Pulse 65   Temp (!) 97.2 F (36.2 C) (Oral)   Ht '5\' 11"'  (1.803 m)   Wt 210 lb (95.3 kg)   BMI 29.29 kg/m       Assessment & Plan:  1. Essential hypertension Low sodium diet - lisinopril-hydrochlorothiazide (PRINZIDE,ZESTORETIC) 10-12.5 MG tablet; Take 1 tablet by mouth daily.  Dispense: 90 tablet; Refill: 1 - CMP14+EGFR  2. Incisional hernia, without obstruction or gangrene To ER if develops pain  3. Mixed hyperlipidemia Low fat diet - simvastatin (ZOCOR) 40 MG tablet; Take 1 tablet (40 mg total) by mouth at bedtime.  Dispense: 90 tablet; Refill: 1 - Lipid panel  4. BMI 28.0-28.9,adult Discussed diet and exercise for person with BMI >25 Will recheck weight in 3-6 months    Labs pending Health maintenance reviewed Diet and exercise encouraged Continue all meds Follow up  In 6 months    [place], FNP

## 2016-09-10 NOTE — Patient Instructions (Signed)
Fat and Cholesterol Restricted Diet Getting too much fat and cholesterol in your diet may cause health problems. Following this diet helps keep your fat and cholesterol at normal levels. This can keep you from getting sick. What types of fat should I choose?  Choose monosaturated and polyunsaturated fats. These are found in foods such as olive oil, canola oil, flaxseeds, walnuts, almonds, and seeds.  Eat more omega-3 fats. Good choices include salmon, mackerel, sardines, tuna, flaxseed oil, and ground flaxseeds.  Limit saturated fats. These are in animal products such as meats, butter, and cream. They can also be in plant products such as palm oil, palm kernel oil, and coconut oil.  Avoid foods with partially hydrogenated oils in them. These contain trans fats. Examples of foods that have trans fats are stick margarine, some tub margarines, cookies, crackers, and other baked goods. What general guidelines do I need to follow?  Check food labels. Look for the words "trans fat" and "saturated fat."  When preparing a meal: ? Fill half of your plate with vegetables and green salads. ? Fill one fourth of your plate with whole grains. Look for the word "whole" as the first word in the ingredient list. ? Fill one fourth of your plate with lean protein foods.  Eat more foods that have fiber, like apples, carrots, beans, peas, and barley.  Eat more home-cooked foods. Eat less at restaurants and buffets.  Limit or avoid alcohol.  Limit foods high in starch and sugar.  Limit fried foods.  Cook foods without frying them. Baking, boiling, grilling, and broiling are all great options.  Lose weight if you are overweight. Losing even a small amount of weight can help your overall health. It can also help prevent diseases such as diabetes and heart disease. What foods can I eat? Grains Whole grains, such as whole wheat or whole grain breads, crackers, cereals, and pasta. Unsweetened oatmeal,  bulgur, barley, quinoa, or brown rice. Corn or whole wheat flour tortillas. Vegetables Fresh or frozen vegetables (raw, steamed, roasted, or grilled). Green salads. Fruits All fresh, canned (in natural juice), or frozen fruits. Meat and Other Protein Products Ground beef (85% or leaner), grass-fed beef, or beef trimmed of fat. Skinless chicken or turkey. Ground chicken or turkey. Pork trimmed of fat. All fish and seafood. Eggs. Dried beans, peas, or lentils. Unsalted nuts or seeds. Unsalted canned or dry beans. Dairy Low-fat dairy products, such as skim or 1% milk, 2% or reduced-fat cheeses, low-fat ricotta or cottage cheese, or plain low-fat yogurt. Fats and Oils Tub margarines without trans fats. Light or reduced-fat mayonnaise and salad dressings. Avocado. Olive, canola, sesame, or safflower oils. Natural peanut or almond butter (choose ones without added sugar and oil). The items listed above may not be a complete list of recommended foods or beverages. Contact your dietitian for more options. What foods are not recommended? Grains White bread. White pasta. White rice. Cornbread. Bagels, pastries, and croissants. Crackers that contain trans fat. Vegetables White potatoes. Corn. Creamed or fried vegetables. Vegetables in a cheese sauce. Fruits Dried fruits. Canned fruit in light or heavy syrup. Fruit juice. Meat and Other Protein Products Fatty cuts of meat. Ribs, chicken wings, bacon, sausage, bologna, salami, chitterlings, fatback, hot dogs, bratwurst, and packaged luncheon meats. Liver and organ meats. Dairy Whole or 2% milk, cream, half-and-half, and cream cheese. Whole milk cheeses. Whole-fat or sweetened yogurt. Full-fat cheeses. Nondairy creamers and whipped toppings. Processed cheese, cheese spreads, or cheese curds. Sweets and Desserts Corn   syrup, sugars, honey, and molasses. Candy. Jam and jelly. Syrup. Sweetened cereals. Cookies, pies, cakes, donuts, muffins, and ice  cream. Fats and Oils Butter, stick margarine, lard, shortening, ghee, or bacon fat. Coconut, palm kernel, or palm oils. Beverages Alcohol. Sweetened drinks (such as sodas, lemonade, and fruit drinks or punches). The items listed above may not be a complete list of foods and beverages to avoid. Contact your dietitian for more information. This information is not intended to replace advice given to you by your health care provider. Make sure you discuss any questions you have with your health care provider. Document Released: 08/06/2011 Document Revised: 10/12/2015 Document Reviewed: 05/06/2013 Elsevier Interactive Patient Education  2018 [employer]vier Inc.  

## 2016-09-11 LAB — CMP14+EGFR
ALT: 19 IU/L (ref 0–44)
AST: 24 IU/L (ref 0–40)
Albumin/Globulin Ratio: 2 (ref 1.2–2.2)
Albumin: 4.5 g/dL (ref 3.5–4.8)
Alkaline Phosphatase: 32 IU/L — ABNORMAL LOW (ref 39–117)
BUN / CREAT RATIO: 10 (ref 10–24)
BUN: 12 mg/dL (ref 8–27)
Bilirubin Total: 0.7 mg/dL (ref 0.0–1.2)
CALCIUM: 9.4 mg/dL (ref 8.6–10.2)
CHLORIDE: 103 mmol/L (ref 96–106)
CO2: 25 mmol/L (ref 20–29)
CREATININE: 1.23 mg/dL (ref 0.76–1.27)
GFR calc non Af Amer: 57 mL/min/{1.73_m2} — ABNORMAL LOW (ref >59)
GFR, EST AFRICAN AMERICAN: 66 mL/min/{1.73_m2} (ref >59)
GLUCOSE: 97 mg/dL (ref 65–99)
Globulin, Total: 2.2 g/dL (ref 1.5–4.5)
Potassium: 4 mmol/L (ref 3.5–5.2)
Sodium: 142 mmol/L (ref 134–144)
TOTAL PROTEIN: 6.7 g/dL (ref 6.0–8.5)

## 2016-09-11 LAB — LIPID PANEL
Chol/HDL Ratio: 3.8 ratio (ref 0.0–5.0)
Cholesterol, Total: 132 mg/dL (ref 100–199)
HDL: 35 mg/dL — ABNORMAL LOW (ref >39)
LDL Calculated: 54 mg/dL (ref 0–99)
Triglycerides: 214 mg/dL — ABNORMAL HIGH (ref 0–149)
VLDL CHOLESTEROL CAL: 43 mg/dL — AB (ref 5–40)

## 2016-09-27 ENCOUNTER — Ambulatory Visit (INDEPENDENT_AMBULATORY_CARE_PROVIDER_SITE_OTHER): Payer: Medicare Other | Admitting: Urology

## 2016-09-27 DIAGNOSIS — N403 Nodular prostate with lower urinary tract symptoms: Secondary | ICD-10-CM

## 2016-09-27 DIAGNOSIS — R3911 Hesitancy of micturition: Secondary | ICD-10-CM

## 2016-09-27 DIAGNOSIS — C61 Malignant neoplasm of prostate: Secondary | ICD-10-CM | POA: Diagnosis not present

## 2016-09-27 DIAGNOSIS — R972 Elevated prostate specific antigen [PSA]: Secondary | ICD-10-CM | POA: Diagnosis not present

## 2016-11-23 ENCOUNTER — Other Ambulatory Visit: Payer: Self-pay | Admitting: Nurse Practitioner

## 2016-11-23 DIAGNOSIS — I10 Essential (primary) hypertension: Secondary | ICD-10-CM

## 2016-11-25 ENCOUNTER — Telehealth: Payer: Self-pay | Admitting: Nurse Practitioner

## 2016-11-25 NOTE — Telephone Encounter (Signed)
Closing encounter TC to [facility] pt just picked up refill

## 2016-11-25 NOTE — Telephone Encounter (Signed)
What is the name of the medication? LISINOPRIL  Have you contacted your pharmacy to request a refill? Yes, he is out of medication  Which pharmacy would you like this sent to? [facility]   Patient notified that their request is being sent to the clinical staff for review and that they should receive a call once it is complete. If they do not receive a call within 24 hours they can check with their pharmacy or our office.

## 2017-03-13 ENCOUNTER — Ambulatory Visit (INDEPENDENT_AMBULATORY_CARE_PROVIDER_SITE_OTHER): Payer: Medicare Other | Admitting: Nurse Practitioner

## 2017-03-13 ENCOUNTER — Encounter: Payer: Self-pay | Admitting: Nurse Practitioner

## 2017-03-13 VITALS — BP 126/79 | HR 80 | Temp 96.9°F | Ht 71.0 in | Wt 223.0 lb

## 2017-03-13 DIAGNOSIS — K432 Incisional hernia without obstruction or gangrene: Secondary | ICD-10-CM

## 2017-03-13 DIAGNOSIS — R972 Elevated prostate specific antigen [PSA]: Secondary | ICD-10-CM | POA: Diagnosis not present

## 2017-03-13 DIAGNOSIS — E782 Mixed hyperlipidemia: Secondary | ICD-10-CM | POA: Diagnosis not present

## 2017-03-13 DIAGNOSIS — I10 Essential (primary) hypertension: Secondary | ICD-10-CM

## 2017-03-13 DIAGNOSIS — Z6828 Body mass index (BMI) 28.0-28.9, adult: Secondary | ICD-10-CM | POA: Diagnosis not present

## 2017-03-13 MED ORDER — LISINOPRIL-HYDROCHLOROTHIAZIDE 10-12.5 MG PO TABS: 1.0000 | ORAL_TABLET | Freq: Every day | ORAL | 1 refills | Status: DC

## 2017-03-13 MED ORDER — SIMVASTATIN 40 MG PO TABS: 40.0000 mg | ORAL_TABLET | Freq: Every day | ORAL | 1 refills | Status: DC

## 2017-03-13 NOTE — Progress Notes (Signed)
Subjective:    Patient ID: Andre Estrada, male    DOB: 01/15/1941, 77 y.o.   MRN: [id number]  HPI   Andre Estrada is here today for follow up of chronic medical problem.  Outpatient Encounter Medications as of 03/13/2017  Medication Sig  . aspirin 81 MG tablet Take 81 mg by mouth daily.  Marland Kitchen docusate sodium (COLACE) 100 MG capsule Take 100 mg by mouth 2 (two) times daily.  Marland Kitchen lisinopril-hydrochlorothiazide (PRINZIDE,ZESTORETIC) 10-12.5 MG tablet Take 1 tablet by mouth daily.  . simvastatin (ZOCOR) 40 MG tablet Take 1 tablet (40 mg total) by mouth at bedtime.     1. Essential hypertension  No c/o chest pain, sob or headache. Does not check blood pressure at home. BP Readings from Last 3 Encounters:  09/10/16 116/75  06/11/16 117/76  12/11/15 130/83     2. Mixed hyperlipidemia  Does not watch diet  3. BMI 28.0-28.9,adult  No recent weight changes  4. Incisional hernia, without obstruction or gangrene  Is a very large incisional hernia that he says does not bother him at all.    New complaints: None today  Social history: Did not do prostate surgery several months ago he says PSA keeps going up and down. Denies any urinary problems.  Review of Systems  Constitutional: Negative for activity change and appetite change.  HENT: Negative.   Eyes: Negative for pain.  Respiratory: Negative for shortness of breath.   Cardiovascular: Negative for chest pain, palpitations and leg swelling.  Gastrointestinal: Negative for abdominal pain.  Endocrine: Negative for polydipsia.  Genitourinary: Negative.   Skin: Negative for rash.  Neurological: Negative for dizziness, weakness and headaches.  Hematological: Does not bruise/bleed easily.  Psychiatric/Behavioral: Negative.   All other systems reviewed and are negative.      Objective:   Physical Exam  Constitutional: He is oriented to person, place, and time. He appears well-developed and well-nourished.  HENT:  Head:  Normocephalic.  Right Ear: External ear normal.  Left Ear: External ear normal.  Nose: Nose normal.  Mouth/Throat: Oropharynx is clear and moist.  Eyes: EOM are normal. Pupils are equal, round, and reactive to light.  Neck: Normal range of motion. Neck supple. No JVD present. No thyromegaly present.  Cardiovascular: Normal rate, regular rhythm, normal heart sounds and intact distal pulses. Exam reveals no gallop and no friction rub.  No murmur heard. Pulmonary/Chest: Effort normal and breath sounds normal. No respiratory distress. He has no wheezes. He has no rales. He exhibits no tenderness.  Abdominal: Soft. Bowel sounds are normal. He exhibits no mass. There is no tenderness.  Genitourinary: Prostate normal and penis normal.  Musculoskeletal: Normal range of motion. He exhibits no edema.  Lymphadenopathy:    He has no cervical adenopathy.  Neurological: He is alert and oriented to person, place, and time. No cranial nerve deficit.  Skin: Skin is warm and dry.  Psychiatric: He has a normal mood and affect. His behavior is normal. Judgment and thought content normal.    BP 126/79   Pulse 80   Temp (!) 96.9 F (36.1 C) (Oral)   Ht '5\' 11"'  (1.803 m)   Wt 223 lb (101.2 kg)   BMI 31.10 kg/m        Assessment & Plan:  1. Essential hypertension Low sodium diet - CMP14+EGFR - lisinopril-hydrochlorothiazide (PRINZIDE,ZESTORETIC) 10-12.5 MG tablet; Take 1 tablet by mouth daily.  Dispense: 90 tablet; Refill: 1  2. Mixed hyperlipidemia Low fat diet - Lipid panel -  simvastatin (ZOCOR) 40 MG tablet; Take 1 tablet (40 mg total) by mouth at bedtime.  Dispense: 90 tablet; Refill: 1  3. BMI 28.0-28.9,adult Discussed diet and exercise for person with BMI >25 Will recheck weight in 3-6 months  4. Incisional hernia, without obstruction or gangrene  5. Elevated PSA Will let urology know results - PSA, total and free    Labs pending Health maintenance reviewed Diet and exercise  encouraged Continue all meds Follow up  In 3 months   [place], FNP

## 2017-03-13 NOTE — Patient Instructions (Signed)
Prostate-Specific Antigen Test Why am I having this test? The prostate-specific antigen (PSA) test is performed to determine how much PSA you have in your blood. PSA is a type of protein that is normally present in the prostate gland. Certain conditions can cause PSA blood levels to increase, such as:  Infection in the prostate (prostatitis).  Enlargement of the prostate (hypertrophy).  Prostate cancer.  Because PSA levels increase greatly from prostate cancer, this test can be used to confirm a diagnosis of prostate cancer. It may also be used to monitor treatment for prostate cancer and to watch for a return of prostate cancer after treatment has finished. This test has a very high false-positive rate. Therefore, routine PSA screening for all men is no longer recommended. A false-positive result is incorrect because it indicates a condition or finding is present when it is not. What kind of sample is taken? A blood sample is required for this test. It is usually collected by inserting a needle into a vein or by sticking a finger with a small needle. How do I prepare for this test? There is no preparation required for this test. However, there are factors that can affect the results of a PSA test. To get the most accurate results:  Avoid having a rectal exam within several hours before having your blood drawn for this test.  Avoid having any procedures performed on the prostate gland within 6 weeks of having this test.  Avoid ejaculating within 24 hours of having this test.  Tell your health care provider if you had a recent urinary tract infection (UTI).  Tell your health care provider if you are taking medicines to assist with hair growth, such as finasteride.  Tell your health care provider if you have been exposed to a medicine called diethylstilbestrol.  Let your health care provider know if any of these factors apply to you. You may be asked to reschedule the test. What are the  reference ranges? Reference ranges are established after testing a large group of people. Reference ranges may vary among different people, labs, and hospitals. It is your responsibility to obtain your test results. Ask the lab or department performing the test when and how you will get your results.  Low: 0-2.5 ng/mL.  Slightly to moderately elevated: 2.6-10.0 ng/mL.  Moderately elevated: 10.0-19.9 ng/mL.  Significantly elevated: 20 ng/mL or greater.  What do the results mean? PSA test results greater than 4 ng/mL are found in the majority of men with prostate cancer. If your test result is above this level, this can indicate an increased risk for prostate cancer. Increased PSA levels can also indicate other health conditions. Talk with your health care provider to discuss your results, treatment options, and if necessary, the need for more tests. Talk with your health care provider if you have any questions about your results. Talk with your health care provider to discuss your results, treatment options, and if necessary, the need for more tests. Talk with your health care provider if you have any questions about your results. This information is not intended to replace advice given to you by your health care provider. Make sure you discuss any questions you have with your health care provider. Document Released: 03/09/2004 Document Revised: 10/11/2015 Document Reviewed: 06/30/2013 Elsevier Interactive Patient Education  2018 [employer] Inc[employer]  

## 2017-03-14 LAB — CMP14+EGFR
ALK PHOS: 37 IU/L — AB (ref 39–117)
ALT: 20 IU/L (ref 0–44)
AST: 24 IU/L (ref 0–40)
Albumin/Globulin Ratio: 2 (ref 1.2–2.2)
Albumin: 4.7 g/dL (ref 3.5–4.8)
BILIRUBIN TOTAL: 0.5 mg/dL (ref 0.0–1.2)
BUN/Creatinine Ratio: 7 — ABNORMAL LOW (ref 10–24)
BUN: 9 mg/dL (ref 8–27)
CO2: 24 mmol/L (ref 20–29)
Calcium: 9.6 mg/dL (ref 8.6–10.2)
Chloride: 100 mmol/L (ref 96–106)
Creatinine, Ser: 1.34 mg/dL — ABNORMAL HIGH (ref 0.76–1.27)
GFR, EST AFRICAN AMERICAN: 59 mL/min/{1.73_m2} — AB (ref >59)
GFR, EST NON AFRICAN AMERICAN: 51 mL/min/{1.73_m2} — AB (ref >59)
GLOBULIN, TOTAL: 2.3 g/dL (ref 1.5–4.5)
Glucose: 102 mg/dL — ABNORMAL HIGH (ref 65–99)
POTASSIUM: 3.8 mmol/L (ref 3.5–5.2)
Sodium: 140 mmol/L (ref 134–144)
Total Protein: 7 g/dL (ref 6.0–8.5)

## 2017-03-14 LAB — PSA, TOTAL AND FREE
PROSTATE SPECIFIC AG, SERUM: 5.9 ng/mL — AB (ref 0.0–4.0)
PSA, Free Pct: 36.4 %
PSA, Free: 2.15 ng/mL

## 2017-03-14 LAB — LIPID PANEL
Chol/HDL Ratio: 3.4 ratio (ref 0.0–5.0)
Cholesterol, Total: 150 mg/dL (ref 100–199)
HDL: 44 mg/dL (ref >39)
LDL Calculated: 79 mg/dL (ref 0–99)
TRIGLYCERIDES: 133 mg/dL (ref 0–149)
VLDL CHOLESTEROL CAL: 27 mg/dL (ref 5–40)

## 2017-04-11 ENCOUNTER — Ambulatory Visit: Payer: Medicare Other | Admitting: Urology

## 2017-04-11 DIAGNOSIS — C61 Malignant neoplasm of prostate: Secondary | ICD-10-CM | POA: Diagnosis not present

## 2017-04-11 DIAGNOSIS — R3912 Poor urinary stream: Secondary | ICD-10-CM | POA: Diagnosis not present

## 2017-05-22 ENCOUNTER — Encounter (HOSPITAL_COMMUNITY): Payer: Self-pay | Admitting: *Deleted

## 2017-05-22 ENCOUNTER — Emergency Department (HOSPITAL_COMMUNITY)
Admission: EM | Admit: 2017-05-22 | Discharge: 2017-05-22 | Disposition: A | Discharge status: Home / Self Care | Payer: Medicare Other | Attending: Emergency Medicine | Admitting: Emergency Medicine

## 2017-05-22 ENCOUNTER — Other Ambulatory Visit: Payer: Self-pay

## 2017-05-22 DIAGNOSIS — R339 Retention of urine, unspecified: Secondary | ICD-10-CM

## 2017-05-22 DIAGNOSIS — I10 Essential (primary) hypertension: Secondary | ICD-10-CM | POA: Insufficient documentation

## 2017-05-22 DIAGNOSIS — Z85820 Personal history of malignant melanoma of skin: Secondary | ICD-10-CM | POA: Diagnosis not present

## 2017-05-22 DIAGNOSIS — F17210 Nicotine dependence, cigarettes, uncomplicated: Secondary | ICD-10-CM | POA: Diagnosis not present

## 2017-05-22 LAB — URINALYSIS, ROUTINE W REFLEX MICROSCOPIC
BACTERIA UA: NONE SEEN
Bilirubin Urine: NEGATIVE
Glucose, UA: NEGATIVE mg/dL
KETONES UR: NEGATIVE mg/dL
Leukocytes, UA: NEGATIVE
Nitrite: NEGATIVE
PH: 6 (ref 5.0–8.0)
Protein, ur: NEGATIVE mg/dL
SPECIFIC GRAVITY, URINE: 1.012 (ref 1.005–1.030)
SQUAMOUS EPITHELIAL / LPF: NONE SEEN

## 2017-05-22 NOTE — ED Provider Notes (Signed)
Emergency Department Provider Note   I have reviewed the triage vital signs and the nursing notes.   HISTORY  Chief Complaint Urinary Retention   HPI Andre Estrada is a 77 y.o. male with PMH of HLD, HTN, Melanoma, and tobacco use resents to the emergency department for evaluation of urinary retention.  The patient has been having normal urination until this morning when he was only having dribbling.  He notes a history of "prostate problems" and is followed by urology.  He has never had urinary retention required Foley catheter placement.  No history of prostate cancer.  He began having increased pressure in his lower abdomen so presented to the emergency department.  Earlier this week he was treating himself with over-the-counter medication for constipation.  He has started having bowel movements and feels that is resolving.  Denies any dysuria.  No flank pain or fevers.   Past Medical History:  Diagnosis Date  . Hyperlipidemia   . Hypertension   . Melanoma ([place])    BACK AND STOMACH   . Sinusitis   . Tobacco dependence     Patient Active Problem List   Diagnosis Date Noted  . Birth mark 06/08/2014  . BMI 28.0-28.9,adult 11/21/2014  . Incisional hernia, without obstruction or gangrene 05/18/2014  . Hypertension 07/23/2012  . Hyperlipemia 07/23/2012    Past Surgical History:  Procedure Laterality Date  . LYMPH NODE BIOPSY    . MELANOMA EXCISION      Current Outpatient Rx  . Order #: [id number] Class: Historical Med  . Order #: [id number] Class: Historical Med  . Order #: [id number] Class: Normal  . Order #: [id number] Class: Normal    Allergies Sulfa antibiotics  Family History  Problem Relation Age of Onset  . Coronary artery disease Father   . COPD Father     Social History Social History   Tobacco Use  . Smoking status: Current Every Day Smoker  . Smokeless tobacco: Never Used  Substance Use Topics  . Alcohol use: No  . Drug use: No    Review of  Systems  Constitutional: No fever/chills Eyes: No visual changes. ENT: No sore throat. Cardiovascular: Denies chest pain. Respiratory: Denies shortness of breath. Gastrointestinal: No abdominal pain.  No nausea, no vomiting.  No diarrhea.  No constipation. Genitourinary: Negative for dysuria. Positive urinary retention.  Musculoskeletal: Negative for back pain. Skin: Negative for rash. Neurological: Negative for headaches, focal weakness or numbness.  10-point ROS otherwise negative.  ____________________________________________   PHYSICAL EXAM:  VITAL SIGNS: ED Triage Vitals  Enc Vitals Group     BP 05/22/17 1319 (!) 127/98     Pulse Rate 05/22/17 1319 88     Resp 05/22/17 1319 16     Temp 05/22/17 1319 99.1 F (37.3 C)     Temp Source 05/22/17 1319 Oral     SpO2 05/22/17 1319 100 %     Weight 05/22/17 1319 213 lb (96.6 kg)     Pain Score 05/22/17 1318 0   Constitutional: Alert and oriented. Well appearing and in no acute distress. Eyes: Conjunctivae are normal. Head: Atraumatic. Nose: No congestion/rhinnorhea. Mouth/Throat: Mucous membranes are moist.  Neck: No stridor.   Cardiovascular: Normal rate, regular rhythm. Good peripheral circulation. Grossly normal heart sounds.   Respiratory: Normal respiratory effort.  No retractions. Lungs CTAB. Gastrointestinal: Soft and nontender. No distention.  Musculoskeletal: No lower extremity tenderness nor edema. No gross deformities of extremities. Neurologic:  Normal speech and language. No gross focal neurologic deficits are  appreciated.  Skin:  Skin is warm, dry and intact. No rash noted.  ____________________________________________   LABS (all labs ordered are listed, but only abnormal results are displayed)  Labs Reviewed  URINALYSIS, ROUTINE W REFLEX MICROSCOPIC - Abnormal; Notable for the following components:      Result Value   Hgb urine dipstick SMALL (*)    All other components within normal limits    ____________________________________________   PROCEDURES  Procedure(s) performed:   Procedures  None ____________________________________________   INITIAL IMPRESSION / ASSESSMENT AND PLAN / ED COURSE  Pertinent labs & imaging results that were available during my care of the patient were reviewed by me and considered in my medical decision making (see chart for details).  Patient presents to the emergency department for evaluation of urinary retention.  He has a history of BPH.  Had constipation earlier in the week which is resolved.  He is followed by local urology here for prostate issues.  The patient had greater than 1 L of urine retained on arrival by bedside scan.  A Foley catheter was placed and leg bag was attached.  Urinalysis sent prior to my evaluation.  Patient had normal urination yesterday.  No infection symptoms.  No indication for labs at this time.  Plan to leave Foley catheter in place and send the patient to his urologist for void trial. Educated the patient on foley catheter care and follow up plan.   At this time, I do not feel there is any life-threatening condition present. I have reviewed and discussed all results (EKG, imaging, lab, urine as appropriate), exam findings with patient. I have reviewed nursing notes and appropriate previous records.  I feel the patient is safe to be discharged home without further emergent workup. Discussed usual and customary return precautions. Patient and family (if present) verbalize understanding and are comfortable with this plan.  Patient will follow-up with their primary care provider. If they do not have a primary care provider, information for follow-up has been provided to them. All questions have been answered.   ____________________________________________  FINAL CLINICAL IMPRESSION(S) / ED DIAGNOSES  Final diagnoses:  Urinary retention    Note:  This document was prepared using Dragon voice recognition software  and may include unintentional dictation errors.  Nanda Quinton, MD Emergency Medicine    Long, [facility], MD 05/22/17 [phone]

## 2017-05-22 NOTE — ED Triage Notes (Signed)
Pt c/o not being able to completely empty his bladder. Pt reports he is able to urinate but only small amounts. Pt has hx of prostate problems. Denies dysuria and flank pain.

## 2017-05-22 NOTE — Discharge Instructions (Signed)
You were seen in the ED today with urinary retention likely due to an enlarged prostate. Keep the foley catheter in place and call your Urologist today to schedule an office appointment in 1-2 weeks.   Return to the ED with any fever chills, pain, or blood in the urine.

## 2017-05-29 DIAGNOSIS — R338 Other retention of urine: Secondary | ICD-10-CM | POA: Diagnosis not present

## 2017-06-03 ENCOUNTER — Emergency Department (HOSPITAL_COMMUNITY)
Admission: EM | Admit: 2017-06-03 | Discharge: 2017-06-03 | Disposition: A | Discharge status: Home / Self Care | Payer: Medicare Other | Attending: Emergency Medicine | Admitting: Emergency Medicine

## 2017-06-03 ENCOUNTER — Encounter (HOSPITAL_COMMUNITY): Payer: Self-pay | Admitting: Emergency Medicine

## 2017-06-03 DIAGNOSIS — Z87898 Personal history of other specified conditions: Secondary | ICD-10-CM | POA: Diagnosis not present

## 2017-06-03 DIAGNOSIS — Z7982 Long term (current) use of aspirin: Secondary | ICD-10-CM | POA: Insufficient documentation

## 2017-06-03 DIAGNOSIS — F17200 Nicotine dependence, unspecified, uncomplicated: Secondary | ICD-10-CM | POA: Diagnosis not present

## 2017-06-03 DIAGNOSIS — I10 Essential (primary) hypertension: Secondary | ICD-10-CM | POA: Insufficient documentation

## 2017-06-03 DIAGNOSIS — R319 Hematuria, unspecified: Secondary | ICD-10-CM | POA: Diagnosis present

## 2017-06-03 DIAGNOSIS — Z79899 Other long term (current) drug therapy: Secondary | ICD-10-CM | POA: Insufficient documentation

## 2017-06-03 DIAGNOSIS — Z87440 Personal history of urinary (tract) infections: Secondary | ICD-10-CM | POA: Diagnosis not present

## 2017-06-03 DIAGNOSIS — N3001 Acute cystitis with hematuria: Secondary | ICD-10-CM | POA: Diagnosis not present

## 2017-06-03 LAB — BASIC METABOLIC PANEL
Anion gap: 9 (ref 5–15)
BUN: 15 mg/dL (ref 6–20)
CO2: 27 mmol/L (ref 22–32)
CREATININE: 1.19 mg/dL (ref 0.61–1.24)
Calcium: 8.9 mg/dL (ref 8.9–10.3)
Chloride: 103 mmol/L (ref 101–111)
GFR calc Af Amer: >60 mL/min (ref >60)
GFR calc non Af Amer: 58 mL/min — ABNORMAL LOW (ref >60)
Glucose, Bld: 175 mg/dL — ABNORMAL HIGH (ref 65–99)
POTASSIUM: 3.2 mmol/L — AB (ref 3.5–5.1)
Sodium: 139 mmol/L (ref 135–145)

## 2017-06-03 LAB — URINALYSIS, ROUTINE W REFLEX MICROSCOPIC
Bilirubin Urine: NEGATIVE
GLUCOSE, UA: NEGATIVE mg/dL
KETONES UR: NEGATIVE mg/dL
Nitrite: POSITIVE — AB
PH: 8 (ref 5.0–8.0)
PROTEIN: 30 mg/dL — AB
Specific Gravity, Urine: 1.01 (ref 1.005–1.030)

## 2017-06-03 LAB — CBC WITH DIFFERENTIAL/PLATELET
BASOS ABS: 0 10*3/uL (ref 0.0–0.1)
Basophils Relative: 0 %
EOS ABS: 0.3 10*3/uL (ref 0.0–0.7)
Eosinophils Relative: 2 %
HCT: 43.4 % (ref 39.0–52.0)
HEMOGLOBIN: 14.6 g/dL (ref 13.0–17.0)
Lymphocytes Relative: 21 %
Lymphs Abs: 3.1 10*3/uL (ref 0.7–4.0)
MCH: 32.4 pg (ref 26.0–34.0)
MCHC: 33.6 g/dL (ref 30.0–36.0)
MCV: 96.2 fL (ref 78.0–100.0)
Monocytes Absolute: 1.5 10*3/uL — ABNORMAL HIGH (ref 0.1–1.0)
Monocytes Relative: 11 %
NEUTROS PCT: 66 %
Neutro Abs: 9.7 10*3/uL — ABNORMAL HIGH (ref 1.7–7.7)
Platelets: 179 10*3/uL (ref 150–400)
RBC: 4.51 MIL/uL (ref 4.22–5.81)
RDW: 13.4 % (ref 11.5–15.5)
WBC: 14.6 10*3/uL — AB (ref 4.0–10.5)

## 2017-06-03 MED ORDER — SODIUM CHLORIDE 0.9 % IV SOLN
1.0000 g | Freq: Once | INTRAVENOUS | Status: AC
  Administered 2017-06-03: 1 g via INTRAVENOUS
  Filled 2017-06-03: qty 10

## 2017-06-03 MED ORDER — CEPHALEXIN 500 MG PO CAPS: 500.0000 mg | ORAL_CAPSULE | Freq: Four times a day (QID) | ORAL | 0 refills | Status: DC

## 2017-06-03 NOTE — ED Triage Notes (Signed)
Patient states he has urinary catheter and noticed blood in his urine starting this morning. Also complaining of back pain this morning.

## 2017-06-03 NOTE — Discharge Instructions (Signed)
Urine today's highly suggestive urinary tract infection.  That may be the cause of the blood in the urine.  Keep your appointment with urology as scheduled for tomorrow.  Start the antibiotic Keflex as prescribed for the next 7 days.  Follow-up for any new or worse symptoms.  For right now are going to keep the catheter in place.

## 2017-06-04 ENCOUNTER — Ambulatory Visit (INDEPENDENT_AMBULATORY_CARE_PROVIDER_SITE_OTHER): Payer: Medicare Other | Admitting: Urology

## 2017-06-04 DIAGNOSIS — R338 Other retention of urine: Secondary | ICD-10-CM

## 2017-06-04 NOTE — ED Provider Notes (Signed)
[facility] Provider Note   CSN: [id number] Arrival date & time: 06/03/17  1110     History   Chief Complaint Chief Complaint  Patient presents with  . Hematuria    HPI Andre Estrada is a 77 y.o. male.  Patient with concerned for blood that he saw in his leg bag from his Foley catheter.  Patient was seen April 4 for urinary retention and had catheter placed.  He has follow-up with urology tomorrow.  Patient had some complaint of some back pain today but no dysuria no flank pain no fevers no chills.  Patient states that the leg bag has been filling.     Past Medical History:  Diagnosis Date  . Hyperlipidemia   . Hypertension   . Melanoma ([place])    BACK AND STOMACH   . Sinusitis   . Tobacco dependence     Patient Active Problem List   Diagnosis Date Noted  . Birth mark 06/08/2014  . BMI 28.0-28.9,adult 11/21/2014  . Incisional hernia, without obstruction or gangrene 05/18/2014  . Hypertension 07/23/2012  . Hyperlipemia 07/23/2012    Past Surgical History:  Procedure Laterality Date  . LYMPH NODE BIOPSY    . MELANOMA EXCISION          Home Medications    Prior to Admission medications   Medication Sig Start Date End Date Taking? Authorizing Provider  aluminum-magnesium hydroxide 200-200 MG/5ML suspension Take 10 mLs by mouth every 6 (six) hours as needed for indigestion.   Yes [provider]  aspirin 81 MG tablet Take 81 mg by mouth daily.   Yes [provider]  lisinopril-hydrochlorothiazide (PRINZIDE,ZESTORETIC) 10-12.5 MG tablet Take 1 tablet by mouth daily. 03/13/17  Yes Martin, Mary-Margaret, FNP  simvastatin (ZOCOR) 40 MG tablet Take 1 tablet (40 mg total) by mouth at bedtime. 03/13/17  Yes Hassell Done, Mary-Margaret, FNP  tamsulosin (FLOMAX) 0.4 MG CAPS capsule Take 0.4 mg by mouth daily after supper.  05/29/17  Yes [provider]  cephALEXin (KEFLEX) 500 MG capsule Take 1 capsule (500 mg total) by mouth 4  (four) times daily. 06/03/17   Fredia Sorrow, MD    Family History Family History  Problem Relation Age of Onset  . Coronary artery disease Father   . COPD Father     Social History Social History   Tobacco Use  . Smoking status: Current Every Day Smoker  . Smokeless tobacco: Never Used  Substance Use Topics  . Alcohol use: No  . Drug use: No     Allergies   Sulfa antibiotics   Review of Systems Review of Systems  Constitutional: Negative for fever.  HENT: Negative for congestion.   Eyes: Negative for redness.  Respiratory: Negative for shortness of breath.   Cardiovascular: Negative for chest pain.  Gastrointestinal: Negative for abdominal pain, nausea and vomiting.  Genitourinary: Positive for hematuria. Negative for dysuria.  Musculoskeletal: Positive for back pain.  Skin: Negative for rash.  Neurological: Negative for headaches.  Hematological: Does not bruise/bleed easily.  Psychiatric/Behavioral: Negative for confusion.     Physical Exam Updated Vital Signs BP 126/81 (BP Location: Left Arm)   Pulse 67   Temp 97.8 F (36.6 C) (Oral)   Resp 18   Wt 96.6 kg (213 lb)   SpO2 97%   BMI 29.71 kg/m   Physical Exam  Constitutional: He is oriented to person, place, and time. He appears well-developed and well-nourished. No distress.  HENT:  Head: Normocephalic and atraumatic.  Mouth/Throat: Oropharynx is clear and moist.  Eyes: Pupils are equal, round, and reactive to light. Conjunctivae and EOM are normal.  Neck: Neck supple.  Cardiovascular: Normal rate, regular rhythm and normal heart sounds.  Pulmonary/Chest: Effort normal and breath sounds normal. No respiratory distress.  Abdominal: Soft. Bowel sounds are normal. There is no tenderness.  Patient has some abdominal distention's nontender patient states no change from his baseline.  Genitourinary: Penis normal.  Genitourinary Comments: Connection of the leg bag to the catheter with a little bit of  gross blood.  Urine in the leg bag relatively clear no gross hematuria.  Musculoskeletal: Normal range of motion.  Neurological: He is alert and oriented to person, place, and time. No cranial nerve deficit or sensory deficit. He exhibits normal muscle tone. Coordination normal.  Skin: Skin is warm.  Nursing note and vitals reviewed.    ED Treatments / Results  Labs (all labs ordered are listed, but only abnormal results are displayed) Labs Reviewed  URINALYSIS, ROUTINE W REFLEX MICROSCOPIC - Abnormal; Notable for the following components:      Result Value   APPearance HAZY (*)    Hgb urine dipstick LARGE (*)    Protein, ur 30 (*)    Nitrite POSITIVE (*)    Leukocytes, UA LARGE (*)    Bacteria, UA RARE (*)    Squamous Epithelial / LPF 0-5 (*)    All other components within normal limits  BASIC METABOLIC PANEL - Abnormal; Notable for the following components:   Potassium 3.2 (*)    Glucose, Bld 175 (*)    GFR calc non Af Amer 58 (*)    All other components within normal limits  CBC WITH DIFFERENTIAL/PLATELET - Abnormal; Notable for the following components:   WBC 14.6 (*)    Neutro Abs 9.7 (*)    Monocytes Absolute 1.5 (*)    All other components within normal limits  URINE CULTURE    EKG None  Radiology No results found.  Procedures Procedures (including critical care time)  Medications Ordered in ED Medications  cefTRIAXone (ROCEPHIN) 1 g in sodium chloride 0.9 % 100 mL IVPB (0 g Intravenous Stopped 06/03/17 1516)     Initial Impression / Assessment and Plan / ED Course  I have reviewed the triage vital signs and the nursing notes.  Pertinent labs & imaging results that were available during my care of the patient were reviewed by me and considered in my medical decision making (see chart for details).     Patient has been seen April 4 for urinary retention had Foley catheter placed at that time.  Has follow-up tomorrow with Dr. Thurmond Butts from urology here at  [facility].  Patient came in because he saw some blood in his urine starting this morning.  Also had some back pain associated with it.  No pain with urination.  No flank pain.  Leg bag throughout his stay had no significant gross hematuria however urinalysis was highly suggestive of urinary tract infection urine sent for culture.  Patient given a dose of Rocephin and will be continued on Keflex.  Patient wanted the catheter removed but felt it was best to keep it in place until he was seen by urology tomorrow.  Renal function without any significant abnormalities.  Patient does have a leukocytosis of 14,000.  This is also part of the reason why patient received some IV antibiotics prior to discharge home.  Patient was nontoxic no acute distress.  Final Clinical  Impressions(s) / ED Diagnoses   Final diagnoses:  Acute cystitis with hematuria  History of urinary retention    ED Discharge Orders        Ordered    cephALEXin (KEFLEX) 500 MG capsule  4 times daily     06/03/17 1517       Fredia Sorrow, MD 06/04/17 [phone]

## 2017-06-06 ENCOUNTER — Emergency Department (HOSPITAL_COMMUNITY)
Admission: EM | Admit: 2017-06-06 | Discharge: 2017-06-06 | Disposition: A | Discharge status: Home / Self Care | Payer: Medicare Other | Attending: Emergency Medicine | Admitting: Emergency Medicine

## 2017-06-06 ENCOUNTER — Other Ambulatory Visit: Payer: Self-pay

## 2017-06-06 ENCOUNTER — Encounter (HOSPITAL_COMMUNITY): Payer: Self-pay | Admitting: Emergency Medicine

## 2017-06-06 DIAGNOSIS — I10 Essential (primary) hypertension: Secondary | ICD-10-CM | POA: Diagnosis not present

## 2017-06-06 DIAGNOSIS — R339 Retention of urine, unspecified: Secondary | ICD-10-CM | POA: Insufficient documentation

## 2017-06-06 DIAGNOSIS — Z85820 Personal history of malignant melanoma of skin: Secondary | ICD-10-CM | POA: Diagnosis not present

## 2017-06-06 DIAGNOSIS — Z79899 Other long term (current) drug therapy: Secondary | ICD-10-CM | POA: Insufficient documentation

## 2017-06-06 DIAGNOSIS — Z7982 Long term (current) use of aspirin: Secondary | ICD-10-CM | POA: Insufficient documentation

## 2017-06-06 DIAGNOSIS — F17200 Nicotine dependence, unspecified, uncomplicated: Secondary | ICD-10-CM | POA: Insufficient documentation

## 2017-06-06 DIAGNOSIS — R1030 Lower abdominal pain, unspecified: Secondary | ICD-10-CM | POA: Diagnosis not present

## 2017-06-06 HISTORY — DX: Benign prostatic hyperplasia without lower urinary tract symptoms: N40.0

## 2017-06-06 LAB — URINALYSIS, ROUTINE W REFLEX MICROSCOPIC
BACTERIA UA: NONE SEEN
Bilirubin Urine: NEGATIVE
Glucose, UA: NEGATIVE mg/dL
KETONES UR: NEGATIVE mg/dL
LEUKOCYTES UA: NEGATIVE
Nitrite: NEGATIVE
PH: 6 (ref 5.0–8.0)
PROTEIN: NEGATIVE mg/dL
SQUAMOUS EPITHELIAL / LPF: NONE SEEN
Specific Gravity, Urine: 1.013 (ref 1.005–1.030)

## 2017-06-06 NOTE — ED Provider Notes (Signed)
[facility] DE[facility]MENT Provider Note   CSN: [id number] Arrival date & time: 06/06/17  2706     History   Chief Complaint Chief Complaint  Patient presents with  . Urinary Retention    HPI Andre Estrada is a 77 y.o. male.  Patient states that he is unable to urinate.  Patient has had urinary retention before and recently had a Foley taken out  The history is provided by the patient. No language interpreter was used.  Abdominal Pain   This is a recurrent problem. The current episode started 2 days ago. The problem occurs constantly. The problem has not changed since onset.The pain is associated with an unknown factor. The pain is located in the suprapubic region. The quality of the pain is aching. The pain is at a severity of 4/10. The pain is moderate. Pertinent negatives include anorexia, diarrhea, frequency, hematuria and headaches.    Past Medical History:  Diagnosis Date  . Hyperlipidemia   . Hypertension   . Melanoma ([place])    BACK AND STOMACH   . Prostate enlargement   . Sinusitis   . Tobacco dependence     Patient Active Problem List   Diagnosis Date Noted  . Birth mark 06/08/2014  . BMI 28.0-28.9,adult 11/21/2014  . Incisional hernia, without obstruction or gangrene 05/18/2014  . Hypertension 07/23/2012  . Hyperlipemia 07/23/2012    Past Surgical History:  Procedure Laterality Date  . LYMPH NODE BIOPSY    . MELANOMA EXCISION          Home Medications    Prior to Admission medications   Medication Sig Start Date End Date Taking? Authorizing Provider  aluminum-magnesium hydroxide 200-200 MG/5ML suspension Take 10 mLs by mouth every 6 (six) hours as needed for indigestion.    [provider]  aspirin 81 MG tablet Take 81 mg by mouth daily.    [provider]  cephALEXin (KEFLEX) 500 MG capsule Take 1 capsule (500 mg total) by mouth 4 (four) times daily. 06/03/17   Fredia Sorrow, MD  lisinopril-hydrochlorothiazide  (PRINZIDE,ZESTORETIC) 10-12.5 MG tablet Take 1 tablet by mouth daily. 03/13/17   Hassell Done, Mary-Margaret, FNP  simvastatin (ZOCOR) 40 MG tablet Take 1 tablet (40 mg total) by mouth at bedtime. 03/13/17   Hassell Done, Mary-Margaret, FNP  tamsulosin (FLOMAX) 0.4 MG CAPS capsule Take 0.4 mg by mouth daily after supper.  05/29/17   [provider]    Family History Family History  Problem Relation Age of Onset  . Coronary artery disease Father   . COPD Father     Social History Social History   Tobacco Use  . Smoking status: Current Every Day Smoker    Packs/day: 1.00  . Smokeless tobacco: Never Used  Substance Use Topics  . Alcohol use: No  . Drug use: No     Allergies   Sulfa antibiotics   Review of Systems Review of Systems  Constitutional: Negative for appetite change and fatigue.  HENT: Negative for congestion, ear discharge and sinus pressure.   Eyes: Negative for discharge.  Respiratory: Negative for cough.   Cardiovascular: Negative for chest pain.  Gastrointestinal: Positive for abdominal pain. Negative for anorexia and diarrhea.  Genitourinary: Negative for frequency and hematuria.  Musculoskeletal: Negative for back pain.  Skin: Negative for rash.  Neurological: Negative for seizures and headaches.  Psychiatric/Behavioral: Negative for hallucinations.     Physical Exam Updated Vital Signs BP 138/88 (BP Location: Right Arm)   Pulse 84   Temp 97.9  F (36.6 C) (Oral)   Resp 18   Ht 6' (1.829 m)   Wt 96.6 kg (213 lb)   SpO2 98%   BMI 28.89 kg/m   Physical Exam  Constitutional: He is oriented to person, place, and time. He appears well-developed.  HENT:  Head: Normocephalic.  Eyes: Conjunctivae and EOM are normal. No scleral icterus.  Neck: Neck supple. No thyromegaly present.  Cardiovascular: Normal rate and regular rhythm. Exam reveals no gallop and no friction rub.  No murmur heard. Pulmonary/Chest: No stridor. He has no wheezes. He has no rales.  He exhibits no tenderness.  Abdominal: He exhibits no distension. There is tenderness. There is no rebound.  Distended abdomen  Musculoskeletal: Normal range of motion. He exhibits no edema.  Lymphadenopathy:    He has no cervical adenopathy.  Neurological: He is oriented to person, place, and time. He exhibits normal muscle tone. Coordination normal.  Skin: No rash noted. No erythema.  Psychiatric: He has a normal mood and affect. His behavior is normal.     ED Treatments / Results  Labs (all labs ordered are listed, but only abnormal results are displayed) Labs Reviewed  URINALYSIS, ROUTINE W REFLEX MICROSCOPIC - Abnormal; Notable for the following components:      Result Value   Hgb urine dipstick SMALL (*)    All other components within normal limits    EKG None  Radiology No results found.  Procedures Procedures (including critical care time)  Medications Ordered in ED Medications - No data to display   Initial Impression / Assessment and Plan / ED Course  I have reviewed the triage vital signs and the nursing notes.  Pertinent labs & imaging results that were available during my care of the patient were reviewed by me and considered in my medical decision making (see chart for details).   Patient with urinary retention.  Patient had a Foley placed and a liter of urine occurred in the bag.  Patient was improved and will follow up with urology   Final Clinical Impressions(s) / ED Diagnoses   Final diagnoses:  Urinary retention    ED Discharge Orders    None       Milton Ferguson, MD 06/06/17 [phone]

## 2017-06-06 NOTE — ED Triage Notes (Signed)
PT states he had a urinary catheter removed x2 days ago and hasn't been able to urinate well since then.

## 2017-06-06 NOTE — Discharge Instructions (Addendum)
Follow up with [facility] next week °

## 2017-06-07 LAB — URINE CULTURE

## 2017-06-08 ENCOUNTER — Telehealth: Payer: Self-pay

## 2017-06-08 NOTE — Progress Notes (Signed)
ED Antimicrobial Stewardship Positive Culture Follow Up   Andre Estrada is an 77 y.o. male who presented to [facility] on 06/06/2017 with a chief complaint of  Chief Complaint  Patient presents with  . Urinary Retention    Recent Results (from the past 720 hour(s))  Urine Culture     Status: Abnormal   Collection Time: 06/03/17 11:50 AM  Result Value Ref Range Status   Specimen Description   Final    URINE, CATHETERIZED Performed at [facility], [address], [place], [place] [address]    Special Requests   Final    NONE Performed at [facility], [address], [place], [place] [address]    Culture (A)  Final    >=100,000 COLONIES/mL PROVIDENCIA RETTGERI >=100,000 COLONIES/mL STAPHYLOCOCCUS SAPROPHYTICUS    Report Status 06/07/2017 FINAL  Final   Organism ID, Bacteria PROVIDENCIA RETTGERI (A)  Final   Organism ID, Bacteria STAPHYLOCOCCUS SAPROPHYTICUS (A)  Final      Susceptibility   Staphylococcus saprophyticus - MIC*    CIPROFLOXACIN <=0.5 SENSITIVE Sensitive     GENTAMICIN <=0.5 SENSITIVE Sensitive     NITROFURANTOIN <=16 SENSITIVE Sensitive     OXACILLIN >=4 RESISTANT Resistant     TETRACYCLINE 2 SENSITIVE Sensitive     VANCOMYCIN <=0.5 SENSITIVE Sensitive     TRIMETH/SULFA <=10 SENSITIVE Sensitive     CLINDAMYCIN <=0.25 SENSITIVE Sensitive     RIFAMPIN <=0.5 SENSITIVE Sensitive     Inducible Clindamycin NEGATIVE Sensitive     * >=100,000 COLONIES/mL STAPHYLOCOCCUS SAPROPHYTICUS   Providencia rettgeri - MIC*    AMPICILLIN >=32 RESISTANT Resistant     CEFAZOLIN >=64 RESISTANT Resistant     CEFTRIAXONE <=1 SENSITIVE Sensitive     CIPROFLOXACIN <=0.25 SENSITIVE Sensitive     GENTAMICIN <=1 SENSITIVE Sensitive     IMIPENEM 2 SENSITIVE Sensitive     NITROFURANTOIN 128 RESISTANT Resistant     TRIMETH/SULFA <=20 SENSITIVE Sensitive     AMPICILLIN/SULBACTAM >=32 RESISTANT Resistant     PIP/TAZO <=4 SENSITIVE Sensitive     * >=100,000 COLONIES/mL PROVIDENCIA  RETTGERI    [x]  Treated with Keflex, organism resistant to prescribed antimicrobial []  Patient discharged originally without antimicrobial agent and treatment is now indicated  New antibiotic prescription: Cipro 500mg  BID x 7 days  ED Provider: Arlean Hopping PA-C   [place] 06/08/2017, 12:51 PM

## 2017-06-08 NOTE — Telephone Encounter (Signed)
Post ED Visit - Positive Culture Follow-up: Successful Patient Follow-Up  Culture assessed and recommendations reviewed by: []  Elenor Quinones, Pharm.D. []  Heide Guile, Pharm.D., BCPS AQ-ID []  Parks Neptune, Pharm.D., BCPS []  Alycia Rossetti, Pharm.D., BCPS []  [place], Pharm.D., BCPS, AAHIVP []  Legrand Como, Pharm.D., BCPS, AAHIVP []  Salome Arnt, PharmD, BCPS []  Dimitri Ped, PharmD, BCPS []  Vincenza Hews, PharmD, BCPS Georga Bora Pharm D Positive urine culture  []  Patient discharged without antimicrobial prescription and treatment is now indicated [x]  Organism is resistant to prescribed ED discharge antimicrobial []  Patient with positive blood cultures  Changes discussed with ED provider: Arlean Hopping PAc New antibiotic prescription Cipro 500 mg BID x 7 days  Called to [facility] [phone]  Contacted patient, date 06/08/17, time 1353   Jashon Ishida, Carolynn Comment 06/08/2017, 1:51 PM

## 2017-06-17 ENCOUNTER — Ambulatory Visit (INDEPENDENT_AMBULATORY_CARE_PROVIDER_SITE_OTHER): Payer: Medicare Other | Admitting: Nurse Practitioner

## 2017-06-17 ENCOUNTER — Encounter: Payer: Self-pay | Admitting: Nurse Practitioner

## 2017-06-17 VITALS — BP 113/74 | HR 65 | Temp 97.0°F | Ht 72.0 in | Wt 221.0 lb

## 2017-06-17 DIAGNOSIS — Z6828 Body mass index (BMI) 28.0-28.9, adult: Secondary | ICD-10-CM | POA: Diagnosis not present

## 2017-06-17 DIAGNOSIS — R339 Retention of urine, unspecified: Secondary | ICD-10-CM | POA: Diagnosis not present

## 2017-06-17 DIAGNOSIS — I10 Essential (primary) hypertension: Secondary | ICD-10-CM | POA: Diagnosis not present

## 2017-06-17 DIAGNOSIS — E782 Mixed hyperlipidemia: Secondary | ICD-10-CM | POA: Diagnosis not present

## 2017-06-17 DIAGNOSIS — K432 Incisional hernia without obstruction or gangrene: Secondary | ICD-10-CM

## 2017-06-17 DIAGNOSIS — Q825 Congenital non-neoplastic nevus: Secondary | ICD-10-CM | POA: Diagnosis not present

## 2017-06-17 LAB — CMP14+EGFR
A/G RATIO: 1.8 (ref 1.2–2.2)
ALBUMIN: 4.5 g/dL (ref 3.5–4.8)
ALK PHOS: 39 IU/L (ref 39–117)
ALT: 16 IU/L (ref 0–44)
AST: 22 IU/L (ref 0–40)
BUN / CREAT RATIO: 9 — AB (ref 10–24)
BUN: 12 mg/dL (ref 8–27)
Bilirubin Total: 0.6 mg/dL (ref 0.0–1.2)
CO2: 25 mmol/L (ref 20–29)
CREATININE: 1.32 mg/dL — AB (ref 0.76–1.27)
Calcium: 9.3 mg/dL (ref 8.6–10.2)
Chloride: 103 mmol/L (ref 96–106)
GFR calc Af Amer: 61 mL/min/{1.73_m2} (ref >59)
GFR calc non Af Amer: 52 mL/min/{1.73_m2} — ABNORMAL LOW (ref >59)
GLOBULIN, TOTAL: 2.5 g/dL (ref 1.5–4.5)
Glucose: 101 mg/dL — ABNORMAL HIGH (ref 65–99)
Potassium: 3.8 mmol/L (ref 3.5–5.2)
SODIUM: 142 mmol/L (ref 134–144)
Total Protein: 7 g/dL (ref 6.0–8.5)

## 2017-06-17 LAB — LIPID PANEL
CHOLESTEROL TOTAL: 145 mg/dL (ref 100–199)
Chol/HDL Ratio: 3 ratio (ref 0.0–5.0)
HDL: 48 mg/dL (ref >39)
LDL CALC: 72 mg/dL (ref 0–99)
Triglycerides: 126 mg/dL (ref 0–149)
VLDL Cholesterol Cal: 25 mg/dL (ref 5–40)

## 2017-06-17 MED ORDER — LISINOPRIL-HYDROCHLOROTHIAZIDE 10-12.5 MG PO TABS: 1.0000 | ORAL_TABLET | Freq: Every day | ORAL | 1 refills | Status: DC

## 2017-06-17 MED ORDER — SIMVASTATIN 40 MG PO TABS: 40.0000 mg | ORAL_TABLET | Freq: Every day | ORAL | 1 refills | Status: DC

## 2017-06-17 NOTE — Progress Notes (Signed)
Subjective:    Patient ID: Andre Estrada, male    DOB: 1940-11-01, 76 y.o.   MRN: [id number]  HPI   Andre Estrada is here today for follow up of chronic medical problem.  Outpatient Encounter Medications as of 06/17/2017  Medication Sig  . aluminum-magnesium hydroxide 200-200 MG/5ML suspension Take 10 mLs by mouth every 6 (six) hours as needed for indigestion.  Marland Kitchen aspirin 81 MG tablet Take 81 mg by mouth daily.  . cephALEXin (KEFLEX) 500 MG capsule Take 1 capsule (500 mg total) by mouth 4 (four) times daily.  Marland Kitchen lisinopril-hydrochlorothiazide (PRINZIDE,ZESTORETIC) 10-12.5 MG tablet Take 1 tablet by mouth daily.  . simvastatin (ZOCOR) 40 MG tablet Take 1 tablet (40 mg total) by mouth at bedtime.  . tamsulosin (FLOMAX) 0.4 MG CAPS capsule Take 0.4 mg by mouth daily after supper.      1. Essential hypertension  No c/o chest pain, sob or headache. Does not check blood pressure at home.  2. Birth mark  Patient has a large birth mark on entire left side of face. It does not bother him.  3. Incisional hernia, without obstruction or gangrene  Large incisional hernia that does not bother him at all. He does not want to do anything about it.  4. Mixed hyperlipidemia  Does not really watch diet  5. BMI 28.0-28.9,adult  No recent weight changes    New complaints: He has had some urinary retention and has been seeing urologist. Had to go to ER last week and had foley inserted for urinary retention. Will see urologist Friday for follow up.  Social history: patiients son lives with him.    Review of Systems  Constitutional: Negative for activity change and appetite change.  HENT: Negative.   Eyes: Negative for pain.  Respiratory: Negative for shortness of breath.   Cardiovascular: Negative for chest pain, palpitations and leg swelling.  Gastrointestinal: Negative for abdominal pain.  Endocrine: Negative for polydipsia.  Genitourinary: Positive for difficulty urinating (currently has  a foley catherter in place.).  Skin: Negative for rash.  Neurological: Negative for dizziness, weakness and headaches.  Hematological: Does not bruise/bleed easily.  Psychiatric/Behavioral: Negative.   All other systems reviewed and are negative.      Objective:   Physical Exam  Constitutional: He is oriented to person, place, and time.  HENT:  Head: Normocephalic.  Nose: Nose normal.  Mouth/Throat: Oropharynx is clear and moist.  Eyes: Pupils are equal, round, and reactive to light. EOM are normal.  Neck: Normal range of motion and phonation normal. Neck supple. No JVD present. Carotid bruit is not present. No thyroid mass and no thyromegaly present.  Cardiovascular: Normal rate and regular rhythm.  Pulmonary/Chest: Effort normal and breath sounds normal. No respiratory distress.  Abdominal: Soft. Normal appearance, normal aorta and bowel sounds are normal. There is no tenderness. A hernia (large incisional hernia) is present.  Genitourinary:  Genitourinary Comments: Foley catheter in place.  Musculoskeletal: Normal range of motion.  Lymphadenopathy:    He has no cervical adenopathy.  Neurological: He is alert and oriented to person, place, and time.  Skin: Skin is warm and dry.  Psychiatric: Judgment normal.    BP 113/74   Pulse 65   Temp (!) 97 F (36.1 C) (Oral)   Ht 6' (1.829 m)   Wt 221 lb (100.2 kg)   BMI 29.97 kg/m        Assessment & Plan:  1. Essential hypertension Low sodium diet - lisinopril-hydrochlorothiazide (PRINZIDE,ZESTORETIC) 10-12.5  MG tablet; Take 1 tablet by mouth daily.  Dispense: 90 tablet; Refill: 1 - CMP14+EGFR  2. Birth mark  3. Incisional hernia, without obstruction or gangrene If develop pain needs to be seen  4. Mixed hyperlipidemia Low fat diet - simvastatin (ZOCOR) 40 MG tablet; Take 1 tablet (40 mg total) by mouth at bedtime.  Dispense: 90 tablet; Refill: 1 - Lipid panel  5. BMI 28.0-28.9,adult Discussed diet and exercise  for person with BMI >25 Will recheck weight in 3-6 months  6. Urinary retention * keep follow up appointment with urology   Labs pending Health maintenance reviewed Diet and exercise encouraged Continue all meds Follow up  In 3 months   [place], FNP

## 2017-06-17 NOTE — Patient Instructions (Signed)
Acute Urinary Retention, Male Acute urinary retention is when you are unable to pee (urinate). Acute urinary retention is common in older men. Prostates can get bigger, which blocks the flow of pee. Follow these instructions at home:  Drink enough fluids to keep your pee clear or pale yellow.  If you are sent home with a tube that drains the bladder (catheter), there will be a drainage bag attached to it. There are two types of bags. One is big that you can wear at night without having to empty it. One is smaller and needs to be emptied more often. ? Keep the drainage bag empty. ? Keep the drainage bag lower than your catheter.  Only take medicine as told by your doctor. Contact a doctor if:  You have a low-grade fever.  You have spasms or you are leaking pee when you have spasms. Get help right away if:  You have chills or a fever.  Your catheter stops draining pee.  Your catheter falls out.  You have increased bleeding that does not stop after you have rested and increased the amount of fluids you had been drinking. This information is not intended to replace advice given to you by your health care provider. Make sure you discuss any questions you have with your health care provider. Document Released: 07/24/2007 Document Revised: 07/13/2015 Document Reviewed: 07/16/2012 Elsevier Interactive Patient Education  2017 [employer] Inc[employer]  

## 2017-06-20 ENCOUNTER — Ambulatory Visit: Payer: Medicare Other | Admitting: Urology

## 2017-06-20 DIAGNOSIS — C61 Malignant neoplasm of prostate: Secondary | ICD-10-CM

## 2017-06-20 DIAGNOSIS — R338 Other retention of urine: Secondary | ICD-10-CM

## 2017-06-20 DIAGNOSIS — N403 Nodular prostate with lower urinary tract symptoms: Secondary | ICD-10-CM | POA: Diagnosis not present

## 2017-06-27 ENCOUNTER — Ambulatory Visit: Payer: Medicare Other | Admitting: Urology

## 2017-06-27 ENCOUNTER — Other Ambulatory Visit (HOSPITAL_COMMUNITY)
Admission: RE | Admit: 2017-06-27 | Discharge: 2017-06-27 | Disposition: A | Discharge status: Home / Self Care | Payer: Medicare Other | Source: Other Acute Inpatient Hospital | Attending: Urology | Admitting: Urology

## 2017-06-27 DIAGNOSIS — R338 Other retention of urine: Secondary | ICD-10-CM | POA: Insufficient documentation

## 2017-06-27 LAB — URINALYSIS, COMPLETE (UACMP) WITH MICROSCOPIC
BACTERIA UA: NONE SEEN
Bilirubin Urine: NEGATIVE
Glucose, UA: NEGATIVE mg/dL
KETONES UR: NEGATIVE mg/dL
NITRITE: NEGATIVE
PROTEIN: NEGATIVE mg/dL
Specific Gravity, Urine: 1.018 (ref 1.005–1.030)
pH: 6 (ref 5.0–8.0)

## 2017-06-29 LAB — URINE CULTURE

## 2017-07-16 ENCOUNTER — Ambulatory Visit: Payer: Medicare Other | Admitting: Urology

## 2017-07-16 DIAGNOSIS — N401 Enlarged prostate with lower urinary tract symptoms: Secondary | ICD-10-CM

## 2017-07-16 DIAGNOSIS — R338 Other retention of urine: Secondary | ICD-10-CM | POA: Diagnosis not present

## 2017-09-19 ENCOUNTER — Encounter: Payer: Self-pay | Admitting: Nurse Practitioner

## 2017-09-19 ENCOUNTER — Ambulatory Visit (INDEPENDENT_AMBULATORY_CARE_PROVIDER_SITE_OTHER): Payer: Medicare Other | Admitting: Nurse Practitioner

## 2017-09-19 VITALS — BP 122/68 | HR 63 | Temp 96.8°F | Ht 72.0 in | Wt 226.0 lb

## 2017-09-19 DIAGNOSIS — N401 Enlarged prostate with lower urinary tract symptoms: Secondary | ICD-10-CM | POA: Diagnosis not present

## 2017-09-19 DIAGNOSIS — E782 Mixed hyperlipidemia: Secondary | ICD-10-CM | POA: Diagnosis not present

## 2017-09-19 DIAGNOSIS — Z6828 Body mass index (BMI) 28.0-28.9, adult: Secondary | ICD-10-CM

## 2017-09-19 DIAGNOSIS — R3912 Poor urinary stream: Secondary | ICD-10-CM

## 2017-09-19 DIAGNOSIS — I10 Essential (primary) hypertension: Secondary | ICD-10-CM

## 2017-09-19 DIAGNOSIS — K432 Incisional hernia without obstruction or gangrene: Secondary | ICD-10-CM | POA: Diagnosis not present

## 2017-09-19 MED ORDER — LISINOPRIL-HYDROCHLOROTHIAZIDE 10-12.5 MG PO TABS: 1.0000 | ORAL_TABLET | Freq: Every day | ORAL | 1 refills | Status: DC

## 2017-09-19 MED ORDER — SIMVASTATIN 40 MG PO TABS: 40.0000 mg | ORAL_TABLET | Freq: Every day | ORAL | 1 refills | Status: DC

## 2017-09-19 NOTE — Patient Instructions (Signed)

## 2017-09-19 NOTE — Progress Notes (Signed)
Subjective:    Patient ID: Andre Estrada, male    DOB: 1940/08/12, 77 y.o.   MRN: [id number]   Chief Complaint: Medical management of chronic issues  HPI:  1. Essential hypertension  No c/o chest pain, sob or headache. Does not check blood pressure at home. BP Readings from Last 3 Encounters:  06/17/17 113/74  06/06/17 121/82  06/03/17 126/81     2. Incisional hernia, without obstruction or gangrene  Hernia does not bother him  3. Mixed hyperlipidemia  Does not watch diet  4. BMI 28.0-28.9,adult  No recent weight changes    Outpatient Encounter Medications as of 09/19/2017  Medication Sig  . aluminum-magnesium hydroxide 200-200 MG/5ML suspension Take 10 mLs by mouth every 6 (six) hours as needed for indigestion.  Marland Kitchen aspirin 81 MG tablet Take 81 mg by mouth daily.  Marland Kitchen lisinopril-hydrochlorothiazide (PRINZIDE,ZESTORETIC) 10-12.5 MG tablet Take 1 tablet by mouth daily.  . simvastatin (ZOCOR) 40 MG tablet Take 1 tablet (40 mg total) by mouth at bedtime.  . tamsulosin (FLOMAX) 0.4 MG CAPS capsule Take 0.4 mg by mouth daily after supper.        New complaints: Has urology appointment on august 9 and they want his PSA checked pror. He has had trouble voiding and has had to have catheter inserted 2x. He was put on flomax by urology and that has seemed to help.  Social history: Son lives with him.    Review of Systems  Constitutional: Negative for activity change and appetite change.  HENT: Negative.   Eyes: Negative for pain.  Respiratory: Negative for shortness of breath.   Cardiovascular: Negative for chest pain, palpitations and leg swelling.  Gastrointestinal: Negative for abdominal pain.  Endocrine: Negative for polydipsia.  Genitourinary: Negative.   Skin: Negative for rash.  Neurological: Negative for dizziness, weakness and headaches.  Hematological: Does not bruise/bleed easily.  Psychiatric/Behavioral: Negative.   All other systems reviewed and are  negative.      Objective:   Physical Exam  Constitutional: He is oriented to person, place, and time. He appears well-developed and well-nourished. No distress.  HENT:  Head: Normocephalic.  Nose: Nose normal.  Mouth/Throat: Oropharynx is clear and moist.  Eyes: Pupils are equal, round, and reactive to light. EOM are normal.  Neck: Normal range of motion and phonation normal. Neck supple. No JVD present. Carotid bruit is not present. No thyroid mass and no thyromegaly present.  Cardiovascular: Normal rate and regular rhythm.  Pulmonary/Chest: Effort normal and breath sounds normal. No respiratory distress.  Abdominal: Soft. Normal appearance, normal aorta and bowel sounds are normal. There is no tenderness.  Large ventral hernia- nontender  Musculoskeletal: Normal range of motion.  Lymphadenopathy:    He has no cervical adenopathy.  Neurological: He is alert and oriented to person, place, and time.  Skin: Skin is warm and dry.  Birth mark on left side of face- no changes.  Psychiatric: He has a normal mood and affect. His behavior is normal. Judgment and thought content normal.  Nursing note and vitals reviewed.  BP 122/68   Pulse 63   Temp (!) 96.8 F (36 C) (Oral)   Ht 6' (1.829 m)   Wt 226 lb (102.5 kg)   BMI 30.65 kg/m         Assessment & Plan:  [facility] comes in today with chief complaint of Medical Management of Chronic Issues   Diagnosis and orders addressed:  1. Essential hypertension Low sodium diet - CMP14+EGFR -  lisinopril-hydrochlorothiazide (PRINZIDE,ZESTORETIC) 10-12.5 MG tablet; Take 1 tablet by mouth daily.  Dispense: 90 tablet; Refill: 1  2. Incisional hernia, without obstruction or gangrene  3. Mixed hyperlipidemia Low fat diet - Lipid panel - simvastatin (ZOCOR) 40 MG tablet; Take 1 tablet (40 mg total) by mouth at bedtime.  Dispense: 90 tablet; Refill: 1  4. BMI 28.0-28.9,adult Discussed diet and exercise for person with BMI  >25 Will recheck weight in 3-6 months  5. Benign prostatic hyperplasia with weak urinary stream Keep appointment with urologist - PSA, total and free   Labs pending Health Maintenance reviewed Diet and exercise encouraged  Follow up plan: 6 months   [place], FNP

## 2017-09-20 LAB — LIPID PANEL
Chol/HDL Ratio: 3.1 ratio (ref 0.0–5.0)
Cholesterol, Total: 126 mg/dL (ref 100–199)
HDL: 41 mg/dL (ref >39)
LDL Calculated: 66 mg/dL (ref 0–99)
TRIGLYCERIDES: 94 mg/dL (ref 0–149)
VLDL Cholesterol Cal: 19 mg/dL (ref 5–40)

## 2017-09-20 LAB — CMP14+EGFR
A/G RATIO: 2.3 — AB (ref 1.2–2.2)
ALT: 17 IU/L (ref 0–44)
AST: 21 IU/L (ref 0–40)
Albumin: 4.3 g/dL (ref 3.5–4.8)
Alkaline Phosphatase: 29 IU/L — ABNORMAL LOW (ref 39–117)
BILIRUBIN TOTAL: 0.5 mg/dL (ref 0.0–1.2)
BUN/Creatinine Ratio: 9 — ABNORMAL LOW (ref 10–24)
BUN: 13 mg/dL (ref 8–27)
CHLORIDE: 107 mmol/L — AB (ref 96–106)
CO2: 22 mmol/L (ref 20–29)
Calcium: 9.1 mg/dL (ref 8.6–10.2)
Creatinine, Ser: 1.39 mg/dL — ABNORMAL HIGH (ref 0.76–1.27)
GFR calc Af Amer: 57 mL/min/{1.73_m2} — ABNORMAL LOW (ref >59)
GFR calc non Af Amer: 49 mL/min/{1.73_m2} — ABNORMAL LOW (ref >59)
Globulin, Total: 1.9 g/dL (ref 1.5–4.5)
Glucose: 112 mg/dL — ABNORMAL HIGH (ref 65–99)
Potassium: 3.8 mmol/L (ref 3.5–5.2)
Sodium: 143 mmol/L (ref 134–144)
Total Protein: 6.2 g/dL (ref 6.0–8.5)

## 2017-09-20 LAB — PSA, TOTAL AND FREE
PSA, Free Pct: 24.1 %
PSA, Free: 1.06 ng/mL
Prostate Specific Ag, Serum: 4.4 ng/mL — ABNORMAL HIGH (ref 0.0–4.0)

## 2017-09-26 ENCOUNTER — Ambulatory Visit: Payer: Medicare Other | Admitting: Urology

## 2017-09-26 DIAGNOSIS — N401 Enlarged prostate with lower urinary tract symptoms: Secondary | ICD-10-CM

## 2017-09-26 DIAGNOSIS — C61 Malignant neoplasm of prostate: Secondary | ICD-10-CM | POA: Diagnosis not present

## 2017-09-26 DIAGNOSIS — R972 Elevated prostate specific antigen [PSA]: Secondary | ICD-10-CM | POA: Diagnosis not present

## 2017-09-26 DIAGNOSIS — R3912 Poor urinary stream: Secondary | ICD-10-CM | POA: Diagnosis not present

## 2017-10-02 ENCOUNTER — Ambulatory Visit (INDEPENDENT_AMBULATORY_CARE_PROVIDER_SITE_OTHER): Payer: Medicare Other | Admitting: *Deleted

## 2017-10-02 VITALS — BP 133/80 | HR 65 | Temp 97.4°F | Ht 72.0 in | Wt 222.4 lb

## 2017-10-02 DIAGNOSIS — Z0000 Encounter for general adult medical examination without abnormal findings: Secondary | ICD-10-CM | POA: Diagnosis not present

## 2017-10-02 DIAGNOSIS — Z1211 Encounter for screening for malignant neoplasm of colon: Secondary | ICD-10-CM

## 2017-10-02 NOTE — Progress Notes (Addendum)
Subjective:   Andre Estrada is a 77 y.o. male who presents for an Initial Medicare Annual Wellness Visit. Andre Estrada worked for [employer] for 18 years before retiring. He is widowed and his adult son lives with him in [place]. He does not have any pets, stairs, or throw rugs in the home. He does have stairs going in the home and they do have handrails. He enjoys working in the yard. Fall hazards were discussed with patient.  Review of Systems  Patient states his health is about the same as it was last year at this time   Cardiac Risk Factors include: advanced age (>61men, >8 women);male gender;hypertension;diabetes mellitus    Objective:    Today's Vitals   10/02/17 0901  BP: 133/80  Pulse: 65  Temp: (!) 97.4 F (36.3 C)  TempSrc: Oral  Weight: 222 lb 6.4 oz (100.9 kg)  Height: 6' (1.829 m)   Body mass index is 30.16 kg/m.  Advanced Directives 10/02/2017 06/06/2017 02/14/2014  Does Patient Have a Medical Advance Directive? No;Yes No Yes  Type of Advance Directive Living will - -  Copy of [place] in Chart? - - No - copy requested  Would patient like information on creating a medical advance directive? - No - Patient declined -    Current Medications (verified) Outpatient Encounter Medications as of 10/02/2017  Medication Sig  . aluminum-magnesium hydroxide 200-200 MG/5ML suspension Take 10 mLs by mouth every 6 (six) hours as needed for indigestion.  Marland Kitchen aspirin 81 MG tablet Take 81 mg by mouth daily.  . finasteride (PROSCAR) 5 MG tablet Take 5 mg by mouth daily.  Marland Kitchen lisinopril-hydrochlorothiazide (PRINZIDE,ZESTORETIC) 10-12.5 MG tablet Take 1 tablet by mouth daily.  . simvastatin (ZOCOR) 40 MG tablet Take 1 tablet (40 mg total) by mouth at bedtime.  . tamsulosin (FLOMAX) 0.4 MG CAPS capsule Take 0.4 mg by mouth daily after supper.    No facility-administered encounter medications on file as of 10/02/2017.     Allergies (verified) Sulfa antibiotics    History: Past Medical History:  Diagnosis Date  . Fracture of thumb    left  . Hyperlipidemia   . Hypertension   . Melanoma ([place])    BACK AND STOMACH   . Prostate enlargement   . Sinusitis   . Tobacco dependence    Past Surgical History:  Procedure Laterality Date  . LYMPH NODE BIOPSY    . MELANOMA EXCISION     Family History  Problem Relation Age of Onset  . Coronary artery disease Father   . COPD Father   . Prostatitis Brother   . Diabetes Son   . Prostatitis Brother   . Alcohol abuse Brother    Social History   Socioeconomic History  . Marital status: Widowed    Spouse name: Not on file  . Number of children: 1  . Years of education: 66  . Highest education level: 9th grade  Occupational History  . Not on file  Social Needs  . Financial resource strain: Not hard at all  . Food insecurity:    Worry: Never true    Inability: Never true  . Transportation needs:    Medical: No    Non-medical: No  Tobacco Use  . Smoking status: Current Every Day Smoker    Packs/day: 1.00  . Smokeless tobacco: Never Used  Substance and Sexual Activity  . Alcohol use: No  . Drug use: No  . Sexual activity: Not Currently  Lifestyle  . Physical activity:    Days per week: 3 days    Minutes per session: Not on file  . Stress: Not at all  Relationships  . Social connections:    Talks on phone: Three times a week    Gets together: More than three times a week    Attends religious service: More than 4 times per year    Active member of club or organization: No    Attends meetings of clubs or organizations: Never    Relationship status: Widowed  Other Topics Concern  . Not on file  Social History Narrative  . Not on file   Tobacco Counseling Ready to quit: No Counseling given: Yes   Clinical Intake:  Pain : No/denies pain  BMI - recorded: 30.16 Nutritional Status: BMI > 30  Obese Nutritional Risks: None Diabetes: No  How often do you need to have someone  help you when you read instructions, pamphlets, or other written materials from your doctor or pharmacy?: 1 - Never  Interpreter Needed?: No     Activities of Daily Living In your present state of health, do you have any difficulty performing the following activities: 10/02/2017  Hearing? N  Vision? N  Comment wears glasses  Difficulty concentrating or making decisions? N  Walking or climbing stairs? N  Dressing or bathing? N  Doing errands, shopping? N  Preparing Food and eating ? N  Using the Toilet? N  In the past six months, have you accidently leaked urine? N  Do you have problems with loss of bowel control? N  Managing your Medications? N  Managing your Finances? N  Housekeeping or managing your Housekeeping? N  Some recent data might be hidden     Immunizations and Health Maintenance Immunization History  Administered Date(s) Administered  . Influenza, High Dose Seasonal PF 10/14/2014, 10/29/2016  . Influenza,inj,Quad PF,6+ Mos 11/17/2013, 12/02/2016  . Influenza-Unspecified 10/07/2012, 10/20/2015  . Pneumococcal Conjugate-13 05/18/2014  . Pneumococcal Polysaccharide-23 02/18/2010, 10/29/2016   Health Maintenance Due  Topic Date Due  . INFLUENZA VACCINE  09/18/2017    Patient Care Team: Chevis Pretty, FNP as PCP - General (Nurse Practitioner)  Indicate any recent Medical Services you may have received from other than [facility] providers in the past year (date may be approximate).    Assessment:   This is a routine wellness examination for Andre Estrada.  Hearing/Vision screen No exam data present  Dietary issues and exercise activities discussed: Current Exercise Habits: Home exercise routine, Type of exercise: walking, Time (Minutes): 20, Frequency (Times/Week): 3, Weekly Exercise (Minutes/Week): 60, Intensity: Mild, Exercise limited by: None identified  Goals    . Exercise 3x per week (30 min per time)      Depression Screen PHQ 2/9 Scores 10/02/2017  09/19/2017 06/17/2017 03/13/2017  PHQ - 2 Score 0 0 0 0  PHQ- 9 Score - - - -    Fall Risk Fall Risk  10/02/2017 09/19/2017 06/17/2017 03/13/2017 06/11/2016  Falls in the past year? No No No No No  Risk for fall due to : - - - - -    Is the patient's home free of loose throw rugs in walkways, pet beds, electrical cords, etc?   yes      Grab bars in the bathroom? Yes      Handrails on the stairs?   yes      Adequate lighting?   yes  Timed Get Up and Go performed:   Cognitive Function: MMSE -  Mini Mental State Exam 10/02/2017  Orientation to time 5  Orientation to Place 5  Registration 3  Attention/ Calculation 4  Recall 2  Language- name 2 objects 2  Language- repeat 1  Language- follow 3 step command 3  Language- read & follow direction 1  Write a sentence 1  Copy design 1  Total score 28        Screening Tests Health Maintenance  Topic Date Due  . INFLUENZA VACCINE  09/18/2017  . COLON CANCER SCREENING ANNUAL FOBT  12/17/2017 (Originally 12/11/2016)  . TETANUS/TDAP  06/18/2018 (Originally 12/19/1960)  . COLONOSCOPY  10/17/2021  . PNA vac Low Risk Adult  Completed    Qualifies for Shingles Vaccine? Yes  Cancer Screenings: Lung: Low Dose CT Chest recommended if Age 80-80 years, 30 pack-year currently smoking OR have quit w/in 15years. Patient does qualify. Colorectal: FOBT given   Additional Screenings:   Hepatitis C Screening:       Plan:    Patient will follow up with Ronnald Collum, FNP as planned  Patient was given FOBT today to return   I have personally reviewed and noted the following in the patient's chart:   . Medical and social history . Use of alcohol, tobacco or illicit drugs  . Current medications and supplements . Functional ability and status . Nutritional status . Physical activity . Advanced directives . List of other physicians . Hospitalizations, surgeries, and ER visits in previous 12 months . Vitals . Screenings to include cognitive,  depression, and falls . Referrals and appointments  In addition, I have reviewed and discussed with patient certain preventive protocols, quality metrics, and best practice recommendations. A written personalized care plan for preventive services as well as general preventive health recommendations were provided to patient.     Gareth Morgan, LPN   09/19/6551    I have reviewed and agree with the above AWV documentation.   Mary-Margaret Hassell Done, FNP

## 2017-10-02 NOTE — Patient Instructions (Signed)
  Mr. Andre Estrada , Thank you for taking time to come for your Medicare Wellness Visit. I appreciate your ongoing commitment to your health goals. Please review the following plan we discussed and let me know if I can assist you in the future.   These are the goals we discussed: Goals    . Exercise 3x per week (30 min per time)       This is a list of the screening recommended for you and due dates:  Health Maintenance  Topic Date Due  . Flu Shot  09/18/2017  . Stool Blood Test  12/17/2017*  . Tetanus Vaccine  06/18/2018*  . Colon Cancer Screening  10/17/2021  . Pneumonia vaccines  Completed  *Topic was postponed. The date shown is not the original due date.

## 2017-10-03 ENCOUNTER — Other Ambulatory Visit: Payer: Medicare Other

## 2017-10-03 DIAGNOSIS — Z1211 Encounter for screening for malignant neoplasm of colon: Secondary | ICD-10-CM | POA: Diagnosis not present

## 2017-10-05 LAB — FECAL OCCULT BLOOD, IMMUNOCHEMICAL: FECAL OCCULT BLD: NEGATIVE

## 2017-11-28 DIAGNOSIS — Z029 Encounter for administrative examinations, unspecified: Secondary | ICD-10-CM

## 2017-12-31 ENCOUNTER — Telehealth: Payer: Self-pay | Admitting: Nurse Practitioner

## 2018-03-24 ENCOUNTER — Ambulatory Visit (INDEPENDENT_AMBULATORY_CARE_PROVIDER_SITE_OTHER): Payer: Medicare Other | Admitting: Nurse Practitioner

## 2018-03-24 ENCOUNTER — Encounter: Payer: Self-pay | Admitting: Nurse Practitioner

## 2018-03-24 VITALS — BP 112/67 | HR 69 | Temp 97.0°F | Ht 72.0 in | Wt 227.0 lb

## 2018-03-24 DIAGNOSIS — N400 Enlarged prostate without lower urinary tract symptoms: Secondary | ICD-10-CM | POA: Insufficient documentation

## 2018-03-24 DIAGNOSIS — K432 Incisional hernia without obstruction or gangrene: Secondary | ICD-10-CM | POA: Diagnosis not present

## 2018-03-24 DIAGNOSIS — E782 Mixed hyperlipidemia: Secondary | ICD-10-CM | POA: Diagnosis not present

## 2018-03-24 DIAGNOSIS — I10 Essential (primary) hypertension: Secondary | ICD-10-CM | POA: Diagnosis not present

## 2018-03-24 DIAGNOSIS — Z6828 Body mass index (BMI) 28.0-28.9, adult: Secondary | ICD-10-CM

## 2018-03-24 DIAGNOSIS — Q825 Congenital non-neoplastic nevus: Secondary | ICD-10-CM

## 2018-03-24 MED ORDER — LISINOPRIL-HYDROCHLOROTHIAZIDE 10-12.5 MG PO TABS: 1.0000 | ORAL_TABLET | Freq: Every day | ORAL | 1 refills | Status: DC

## 2018-03-24 MED ORDER — SIMVASTATIN 40 MG PO TABS: 40.0000 mg | ORAL_TABLET | Freq: Every day | ORAL | 1 refills | Status: DC

## 2018-03-24 NOTE — Patient Instructions (Signed)

## 2018-03-24 NOTE — Progress Notes (Signed)
Subjective:    Patient ID: Andre Estrada, male    DOB: [birth year], 78 y.o.   MRN: [id number]   Chief Complaint: medical management of chronic issues  HPI:  1. Essential hypertension  No c/o chest pain, sob or headache. Does not check blood pressure at home.  2. Mixed hyperlipidemia  Does not watch diet at all and does no exercise  3. Incisional hernia, without obstruction or gangrene  Denies any pain.  4. Birth mark  Does not bother him at all  5. BMI 28.0-28.9,adult  Weight is up 5 lbs  6.      BPH          Has appointment with urology on Friday- they want [place] to                  check his PSA whole he is here. He denies any current                   urinary problems.   Outpatient Encounter Medications as of 03/24/2018  Medication Sig  . aluminum-magnesium hydroxide 200-200 MG/5ML suspension Take 10 mLs by mouth every 6 (six) hours as needed for indigestion.  Marland Kitchen aspirin 81 MG tablet Take 81 mg by mouth daily.  . finasteride (PROSCAR) 5 MG tablet Take 5 mg by mouth daily.  Marland Kitchen lisinopril-hydrochlorothiazide (PRINZIDE,ZESTORETIC) 10-12.5 MG tablet Take 1 tablet by mouth daily.  . simvastatin (ZOCOR) 40 MG tablet Take 1 tablet (40 mg total) by mouth at bedtime.  . tamsulosin (FLOMAX) 0.4 MG CAPS capsule Take 0.4 mg by mouth daily after supper.        New complaints: None today  Social history: His son still lives with him. He is able to perform ADL's without asistance   Review of Systems  Constitutional: Negative for activity change and appetite change.  HENT: Negative.   Eyes: Negative for pain.  Respiratory: Negative for shortness of breath.   Cardiovascular: Negative for chest pain, palpitations and leg swelling.  Gastrointestinal: Negative for abdominal pain.  Endocrine: Negative for polydipsia.  Genitourinary: Negative.   Skin: Negative for rash.  Neurological: Negative for dizziness, weakness and headaches.  Hematological: Does not bruise/bleed easily.    Psychiatric/Behavioral: Negative.   All other systems reviewed and are negative.      Objective:   Physical Exam Vitals signs and nursing note reviewed.  Constitutional:      Appearance: Normal appearance. He is well-developed.  HENT:     Head: Normocephalic.     Nose: Nose normal.  Eyes:     Pupils: Pupils are equal, round, and reactive to light.  Neck:     Musculoskeletal: Normal range of motion and neck supple.     Thyroid: No thyroid mass or thyromegaly.     Vascular: No carotid bruit or JVD.     Trachea: Phonation normal.  Cardiovascular:     Rate and Rhythm: Normal rate and regular rhythm.  Pulmonary:     Effort: Pulmonary effort is normal. No respiratory distress.     Breath sounds: Normal breath sounds.  Abdominal:     General: Bowel sounds are normal.     Palpations: Abdomen is soft.     Tenderness: There is no abdominal tenderness.     Comments: nontender incisional hernia of abdominal wall.  Musculoskeletal: Normal range of motion.  Lymphadenopathy:     Cervical: No cervical adenopathy.  Skin:    General: Skin is warm and dry.  Comments: Seedy birth encompassing the left side of his face.  Neurological:     Mental Status: He is alert and oriented to person, place, and time.  Psychiatric:        Behavior: Behavior normal.        Thought Content: Thought content normal.        Judgment: Judgment normal.    BP 112/67   Pulse 69   Temp (!) 97 F (36.1 C) (Oral)   Ht 6' (1.829 m)   Wt 227 lb (103 kg)   BMI 30.79 kg/m        Assessment & Plan:  [facility] comes in today with chief complaint of Medical Management of Chronic Issues   Diagnosis and orders addressed:  1. Essential hypertension Low sodium diet - CMP14+EGFR - lisinopril-hydrochlorothiazide (PRINZIDE,ZESTORETIC) 10-12.5 MG tablet; Take 1 tablet by mouth daily.  Dispense: 90 tablet; Refill: 1  2. Mixed hyperlipidemia Low fat diet - Lipid panel - simvastatin (ZOCOR) 40 MG  tablet; Take 1 tablet (40 mg total) by mouth at bedtime.  Dispense: 90 tablet; Refill: 1  3. Incisional hernia, without obstruction or gangrene  4. Birth mark  5. BMI 28.0-28.9,adult Discussed diet and exercise for person with BMI >25 Will recheck weight in 3-6 months  6. Benign prostatic hyperplasia without lower urinary tract symptoms Keep appointment with urology Labs drawn today for urology - PSA, total and free   Labs pending Health Maintenance reviewed Diet and exercise encouraged  Follow up plan: 6 months   [place], FNP

## 2018-03-25 LAB — CMP14+EGFR
ALK PHOS: 34 IU/L — AB (ref 39–117)
ALT: 21 IU/L (ref 0–44)
AST: 21 IU/L (ref 0–40)
Albumin/Globulin Ratio: 2.1 (ref 1.2–2.2)
Albumin: 4.4 g/dL (ref 3.7–4.7)
BILIRUBIN TOTAL: 0.4 mg/dL (ref 0.0–1.2)
BUN / CREAT RATIO: 12 (ref 10–24)
BUN: 15 mg/dL (ref 8–27)
CHLORIDE: 105 mmol/L (ref 96–106)
CO2: 21 mmol/L (ref 20–29)
Calcium: 9.4 mg/dL (ref 8.6–10.2)
Creatinine, Ser: 1.25 mg/dL (ref 0.76–1.27)
GFR calc Af Amer: 64 mL/min/{1.73_m2} (ref >59)
GFR calc non Af Amer: 56 mL/min/{1.73_m2} — ABNORMAL LOW (ref >59)
Globulin, Total: 2.1 g/dL (ref 1.5–4.5)
Glucose: 114 mg/dL — ABNORMAL HIGH (ref 65–99)
Potassium: 4.2 mmol/L (ref 3.5–5.2)
Sodium: 146 mmol/L — ABNORMAL HIGH (ref 134–144)
Total Protein: 6.5 g/dL (ref 6.0–8.5)

## 2018-03-25 LAB — PSA, TOTAL AND FREE
PSA FREE: 0.86 ng/mL
PSA, Free Pct: 23.9 %
Prostate Specific Ag, Serum: 3.6 ng/mL (ref 0.0–4.0)

## 2018-03-25 LAB — LIPID PANEL
CHOLESTEROL TOTAL: 130 mg/dL (ref 100–199)
Chol/HDL Ratio: 3.3 ratio (ref 0.0–5.0)
HDL: 40 mg/dL (ref >39)
LDL Calculated: 44 mg/dL (ref 0–99)
Triglycerides: 229 mg/dL — ABNORMAL HIGH (ref 0–149)
VLDL Cholesterol Cal: 46 mg/dL — ABNORMAL HIGH (ref 5–40)

## 2018-03-27 ENCOUNTER — Other Ambulatory Visit (HOSPITAL_COMMUNITY)
Admission: RE | Admit: 2018-03-27 | Discharge: 2018-03-27 | Disposition: A | Discharge status: Home / Self Care | Payer: Medicare Other | Source: Ambulatory Visit | Attending: Urology | Admitting: Urology

## 2018-03-27 ENCOUNTER — Ambulatory Visit: Payer: Medicare Other | Admitting: Urology

## 2018-03-27 DIAGNOSIS — R972 Elevated prostate specific antigen [PSA]: Secondary | ICD-10-CM

## 2018-03-27 DIAGNOSIS — R3121 Asymptomatic microscopic hematuria: Secondary | ICD-10-CM

## 2018-03-27 DIAGNOSIS — N401 Enlarged prostate with lower urinary tract symptoms: Secondary | ICD-10-CM | POA: Diagnosis not present

## 2018-03-27 DIAGNOSIS — C61 Malignant neoplasm of prostate: Secondary | ICD-10-CM

## 2018-03-27 LAB — URINALYSIS, COMPLETE (UACMP) WITH MICROSCOPIC
BACTERIA UA: NONE SEEN
Bilirubin Urine: NEGATIVE
GLUCOSE, UA: NEGATIVE mg/dL
Ketones, ur: NEGATIVE mg/dL
Leukocytes, UA: NEGATIVE
Nitrite: NEGATIVE
PH: 6 (ref 5.0–8.0)
Protein, ur: NEGATIVE mg/dL
SPECIFIC GRAVITY, URINE: 1.006 (ref 1.005–1.030)

## 2018-09-21 ENCOUNTER — Other Ambulatory Visit: Payer: Self-pay

## 2018-09-21 ENCOUNTER — Telehealth: Payer: Self-pay | Admitting: Nurse Practitioner

## 2018-09-22 ENCOUNTER — Encounter: Payer: Self-pay | Admitting: Nurse Practitioner

## 2018-09-22 ENCOUNTER — Ambulatory Visit (INDEPENDENT_AMBULATORY_CARE_PROVIDER_SITE_OTHER): Payer: Medicare Other | Admitting: Nurse Practitioner

## 2018-09-22 VITALS — BP 111/74 | HR 60 | Temp 99.3°F | Ht 72.0 in | Wt 216.0 lb

## 2018-09-22 DIAGNOSIS — K432 Incisional hernia without obstruction or gangrene: Secondary | ICD-10-CM

## 2018-09-22 DIAGNOSIS — Z6828 Body mass index (BMI) 28.0-28.9, adult: Secondary | ICD-10-CM

## 2018-09-22 DIAGNOSIS — I10 Essential (primary) hypertension: Secondary | ICD-10-CM | POA: Diagnosis not present

## 2018-09-22 DIAGNOSIS — N400 Enlarged prostate without lower urinary tract symptoms: Secondary | ICD-10-CM | POA: Diagnosis not present

## 2018-09-22 DIAGNOSIS — E782 Mixed hyperlipidemia: Secondary | ICD-10-CM | POA: Diagnosis not present

## 2018-09-22 LAB — URINALYSIS, COMPLETE
Bilirubin, UA: NEGATIVE
Glucose, UA: NEGATIVE
Leukocytes,UA: NEGATIVE
Nitrite, UA: NEGATIVE
Protein,UA: NEGATIVE
Specific Gravity, UA: 1.025 (ref 1.005–1.030)
Urobilinogen, Ur: 0.2 mg/dL (ref 0.2–1.0)
pH, UA: 7 (ref 5.0–7.5)

## 2018-09-22 LAB — MICROSCOPIC EXAMINATION
Bacteria, UA: NONE SEEN
Epithelial Cells (non renal): NONE SEEN /hpf (ref 0–10)
Renal Epithel, UA: NONE SEEN /hpf
WBC, UA: NONE SEEN /hpf (ref 0–5)

## 2018-09-22 MED ORDER — LISINOPRIL-HYDROCHLOROTHIAZIDE 10-12.5 MG PO TABS: 1.0000 | ORAL_TABLET | Freq: Every day | ORAL | 1 refills | Status: DC

## 2018-09-22 MED ORDER — SIMVASTATIN 40 MG PO TABS: 40.0000 mg | ORAL_TABLET | Freq: Every day | ORAL | 1 refills | Status: DC

## 2018-09-22 NOTE — Patient Instructions (Signed)

## 2018-09-22 NOTE — Progress Notes (Signed)
Subjective:    Patient ID: Andre Estrada, male    DOB: 02/25/1940, 78 y.o.   MRN: [id number]   Chief Complaint: Medical Management of Chronic Issues    HPI:  1. Essential hypertension No c/o chest pain, sob or headache. Does not check blood pressure at home. BP Readings from Last 3 Encounters:  09/22/18 111/74  03/24/18 112/67  10/02/17 133/80     2. Mixed hyperlipidemia Tries to watch diet and does very little exercise  3. Benign prostatic hyperplasia without lower urinary tract symptoms Sees Dr. Carmelina Estrada and he wants PSA level checked today and every 6 months. He denies any problems voiding  4. Incisional hernia, without obstruction or gangrene Has large abdominal wall hernia- does not bother ing him. Denies any pain.  5. BMI 28.0-28.9,adult No recent weight changes    Outpatient Encounter Medications as of 09/22/2018  Medication Sig  . aluminum-magnesium hydroxide 200-200 MG/5ML suspension Take 10 mLs by mouth every 6 (six) hours as needed for indigestion.  Marland Kitchen aspirin 81 MG tablet Take 81 mg by mouth daily.  . finasteride (PROSCAR) 5 MG tablet Take 5 mg by mouth daily.  Marland Kitchen lisinopril-hydrochlorothiazide (PRINZIDE,ZESTORETIC) 10-12.5 MG tablet Take 1 tablet by mouth daily.  . simvastatin (ZOCOR) 40 MG tablet Take 1 tablet (40 mg total) by mouth at bedtime.  . tamsulosin (FLOMAX) 0.4 MG CAPS capsule Take 0.4 mg by mouth daily after supper.      Past Surgical History:  Procedure Laterality Date  . LYMPH NODE BIOPSY    . MELANOMA EXCISION      Family History  Problem Relation Age of Onset  . Coronary artery disease Father   . COPD Father   . Prostatitis Brother   . Diabetes Son   . Prostatitis Brother   . Alcohol abuse Brother     New complaints: None today  Social history: His son lives with him and works a Lawyer: N/A    Review of Systems  Constitutional: Negative for activity change and appetite change.  HENT:  Negative.   Eyes: Negative for pain.  Respiratory: Negative for shortness of breath.   Cardiovascular: Negative for chest pain, palpitations and leg swelling.  Gastrointestinal: Negative for abdominal pain.  Endocrine: Negative for polydipsia.  Genitourinary: Negative.   Skin: Negative for rash.  Neurological: Negative for dizziness, weakness and headaches.  Hematological: Does not bruise/bleed easily.  Psychiatric/Behavioral: Negative.   All other systems reviewed and are negative.      Objective:   Physical Exam Vitals signs and nursing note reviewed.  Constitutional:      Appearance: Normal appearance. He is well-developed.  HENT:     Head: Normocephalic.     Nose: Nose normal.  Eyes:     Pupils: Pupils are equal, round, and reactive to light.  Neck:     Musculoskeletal: Normal range of motion and neck supple.     Thyroid: No thyroid mass or thyromegaly.     Vascular: No carotid bruit or JVD.     Trachea: Phonation normal.  Cardiovascular:     Rate and Rhythm: Normal rate and regular rhythm.  Pulmonary:     Effort: Pulmonary effort is normal. No respiratory distress.     Breath sounds: Normal breath sounds.  Abdominal:     General: Bowel sounds are normal.     Palpations: Abdomen is soft.     Tenderness: There is no abdominal tenderness.  Musculoskeletal: Normal range of motion.  Lymphadenopathy:  Cervical: No cervical adenopathy.  Skin:    General: Skin is warm and dry.  Neurological:     Mental Status: He is alert and oriented to person, place, and time.  Psychiatric:        Behavior: Behavior normal.        Thought Content: Thought content normal.        Judgment: Judgment normal.    BP 111/74   Pulse 60   Temp 99.3 F (37.4 C) (Oral)   Ht 6' (1.829 m)   Wt 216 lb (98 kg)   BMI 29.29 kg/m         Assessment & Plan:  [facility] comes in today with chief complaint of Medical Management of Chronic Issues   Diagnosis and orders addressed:   1. Essential hypertension Low sodium diet - CMP14+EGFR - lisinopril-hydrochlorothiazide (ZESTORETIC) 10-12.5 MG tablet; Take 1 tablet by mouth daily.  Dispense: 90 tablet; Refill: 1  2. Mixed hyperlipidemia Low fat diet - Lipid panel - simvastatin (ZOCOR) 40 MG tablet; Take 1 tablet (40 mg total) by mouth at bedtime.  Dispense: 90 tablet; Refill: 1  3. Benign prostatic hyperplasia without lower urinary tract symptoms Keep follow up with Dr. Jeffie Estrada - Urinalysis, Complete - PSA, total and free  4. Incisional hernia, without obstruction or gangrene  5. BMI 28.0-28.9,adult Discussed diet and exercise for person with BMI >25 Will recheck weight in 3-6 months  * shingles vaccine would cost $47 so he wants to wait and check on price at next visit  Labs pending Health Maintenance reviewed Diet and exercise encouraged  Follow up plan: 6 months   [place], FNP

## 2018-09-23 LAB — CMP14+EGFR
ALT: 20 IU/L (ref 0–44)
AST: 18 IU/L (ref 0–40)
Albumin/Globulin Ratio: 1.8 (ref 1.2–2.2)
Albumin: 4.6 g/dL (ref 3.7–4.7)
Alkaline Phosphatase: 42 IU/L (ref 39–117)
BUN/Creatinine Ratio: 13 (ref 10–24)
BUN: 18 mg/dL (ref 8–27)
Bilirubin Total: 0.5 mg/dL (ref 0.0–1.2)
CO2: 23 mmol/L (ref 20–29)
Calcium: 9.6 mg/dL (ref 8.6–10.2)
Chloride: 102 mmol/L (ref 96–106)
Creatinine, Ser: 1.42 mg/dL — ABNORMAL HIGH (ref 0.76–1.27)
GFR calc Af Amer: 55 mL/min/{1.73_m2} — ABNORMAL LOW (ref >59)
GFR calc non Af Amer: 48 mL/min/{1.73_m2} — ABNORMAL LOW (ref >59)
Globulin, Total: 2.6 g/dL (ref 1.5–4.5)
Glucose: 102 mg/dL — ABNORMAL HIGH (ref 65–99)
Potassium: 4 mmol/L (ref 3.5–5.2)
Sodium: 141 mmol/L (ref 134–144)
Total Protein: 7.2 g/dL (ref 6.0–8.5)

## 2018-09-23 LAB — PSA, TOTAL AND FREE
PSA, Free Pct: 31.3 %
PSA, Free: 1.19 ng/mL
Prostate Specific Ag, Serum: 3.8 ng/mL (ref 0.0–4.0)

## 2018-09-23 LAB — LIPID PANEL
Chol/HDL Ratio: 3.3 ratio (ref 0.0–5.0)
Cholesterol, Total: 147 mg/dL (ref 100–199)
HDL: 45 mg/dL (ref >39)
LDL Calculated: 74 mg/dL (ref 0–99)
Triglycerides: 139 mg/dL (ref 0–149)
VLDL Cholesterol Cal: 28 mg/dL (ref 5–40)

## 2018-10-02 ENCOUNTER — Other Ambulatory Visit: Payer: Self-pay

## 2018-10-02 ENCOUNTER — Ambulatory Visit (INDEPENDENT_AMBULATORY_CARE_PROVIDER_SITE_OTHER): Payer: Medicare Other | Admitting: Urology

## 2018-10-02 DIAGNOSIS — N401 Enlarged prostate with lower urinary tract symptoms: Secondary | ICD-10-CM | POA: Diagnosis not present

## 2018-10-02 DIAGNOSIS — C61 Malignant neoplasm of prostate: Secondary | ICD-10-CM | POA: Diagnosis not present

## 2018-10-02 DIAGNOSIS — R972 Elevated prostate specific antigen [PSA]: Secondary | ICD-10-CM | POA: Diagnosis not present

## 2019-02-05 ENCOUNTER — Other Ambulatory Visit: Payer: Self-pay

## 2019-02-05 DIAGNOSIS — C61 Malignant neoplasm of prostate: Secondary | ICD-10-CM

## 2019-03-08 ENCOUNTER — Encounter: Payer: Self-pay | Admitting: Urology

## 2019-03-24 ENCOUNTER — Other Ambulatory Visit: Payer: Self-pay | Admitting: Nurse Practitioner

## 2019-03-24 DIAGNOSIS — E782 Mixed hyperlipidemia: Secondary | ICD-10-CM

## 2019-03-25 ENCOUNTER — Ambulatory Visit: Payer: Self-pay | Admitting: Nurse Practitioner

## 2019-03-31 ENCOUNTER — Other Ambulatory Visit: Payer: Self-pay

## 2019-04-01 ENCOUNTER — Ambulatory Visit (INDEPENDENT_AMBULATORY_CARE_PROVIDER_SITE_OTHER): Payer: Medicare Other | Admitting: Nurse Practitioner

## 2019-04-01 ENCOUNTER — Ambulatory Visit (INDEPENDENT_AMBULATORY_CARE_PROVIDER_SITE_OTHER): Payer: Medicare Other

## 2019-04-01 ENCOUNTER — Encounter: Payer: Self-pay | Admitting: Nurse Practitioner

## 2019-04-01 VITALS — BP 120/76 | HR 67 | Temp 98.6°F | Resp 20 | Ht 72.0 in | Wt 215.0 lb

## 2019-04-01 DIAGNOSIS — Z6828 Body mass index (BMI) 28.0-28.9, adult: Secondary | ICD-10-CM | POA: Diagnosis not present

## 2019-04-01 DIAGNOSIS — E782 Mixed hyperlipidemia: Secondary | ICD-10-CM

## 2019-04-01 DIAGNOSIS — F17210 Nicotine dependence, cigarettes, uncomplicated: Secondary | ICD-10-CM | POA: Diagnosis not present

## 2019-04-01 DIAGNOSIS — I10 Essential (primary) hypertension: Secondary | ICD-10-CM

## 2019-04-01 DIAGNOSIS — N400 Enlarged prostate without lower urinary tract symptoms: Secondary | ICD-10-CM

## 2019-04-01 MED ORDER — SIMVASTATIN 40 MG PO TABS: 40.0000 mg | ORAL_TABLET | Freq: Every day | ORAL | 0 refills | Status: DC

## 2019-04-01 MED ORDER — LISINOPRIL-HYDROCHLOROTHIAZIDE 10-12.5 MG PO TABS: 1.0000 | ORAL_TABLET | Freq: Every day | ORAL | 1 refills | Status: DC

## 2019-04-01 NOTE — Progress Notes (Signed)
Subjective:    Patient ID: Andre Estrada, male    DOB: 10-Aug-1940, 79 y.o.   MRN: [id number]   Chief Complaint: medical mangement of chronic issues   HPI:  1. Essential hypertension Does not check BP at home. Does not watch sodium in diet. Does not add extra salt to food. Denies chest pain, SOB, headaches. Takes BP medicine as prescribed. BP Readings from Last 3 Encounters:  04/01/19 120/76  09/22/18 111/74  03/24/18 112/67     2. Benign prostatic hyperplasia without lower urinary tract symptoms Has appointment with urology tomorrow. No urinary symptoms right now.  3. Mixed hyperlipidemia Does not watch fat or cholesterol in diet. Walks and stays active at home. Lab Results  Component Value Date   CHOL 147 09/22/2018   HDL 45 09/22/2018   LDLCALC 74 09/22/2018   TRIG 139 09/22/2018   CHOLHDL 3.3 09/22/2018     4. BMI 28.0-28.9,adult Has lost 12 pounds in the last year. Wt Readings from Last 3 Encounters:  04/01/19 215 lb (97.5 kg)  09/22/18 216 lb (98 kg)  03/24/18 227 lb (103 kg)   BMI Readings from Last 3 Encounters:  04/01/19 29.16 kg/m  09/22/18 29.29 kg/m  03/24/18 30.79 kg/m       Outpatient Encounter Medications as of 04/01/2019  Medication Sig  . aluminum-magnesium hydroxide 200-200 MG/5ML suspension Take 10 mLs by mouth every 6 (six) hours as needed for indigestion.  Marland Kitchen aspirin 81 MG tablet Take 81 mg by mouth daily.  . finasteride (PROSCAR) 5 MG tablet Take 5 mg by mouth daily.  Marland Kitchen lisinopril-hydrochlorothiazide (ZESTORETIC) 10-12.5 MG tablet Take 1 tablet by mouth daily.  . simvastatin (ZOCOR) 40 MG tablet TAKE 1 TABLET BY MOUTH AT BEDTIME  . tamsulosin (FLOMAX) 0.4 MG CAPS capsule Take 0.4 mg by mouth daily after supper.    No facility-administered encounter medications on file as of 04/01/2019.    Past Surgical History:  Procedure Laterality Date  . LYMPH NODE BIOPSY    . MELANOMA EXCISION      Family History  Problem Relation Age of  Onset  . Coronary artery disease Father   . COPD Father   . Prostatitis Brother   . Diabetes Son   . Prostatitis Brother   . Alcohol abuse Brother     New complaints: None  Social history: Lives with son. Wife passed away 7 years ago.   Controlled substance contract: N/A     Review of Systems  Constitutional: Negative.   HENT: Negative.   Eyes: Negative.   Respiratory: Negative.   Cardiovascular: Negative.   Gastrointestinal: Negative.   Endocrine: Negative.   Genitourinary: Negative.   Musculoskeletal: Negative.   Skin: Negative.   Allergic/Immunologic: Negative.   Neurological: Negative.   Hematological: Negative.   Psychiatric/Behavioral: Negative.        Objective:   Physical Exam Vitals and nursing note reviewed.  Constitutional:      Appearance: Normal appearance. He is normal weight.  HENT:     Head: Normocephalic.     Right Ear: Tympanic membrane, ear canal and external ear normal.     Left Ear: Tympanic membrane, ear canal and external ear normal.     Nose: Nose normal.     Mouth/Throat:     Mouth: Mucous membranes are moist.     Pharynx: Oropharynx is clear.  Eyes:     Extraocular Movements: Extraocular movements intact.     Conjunctiva/sclera: Conjunctivae normal.     Pupils:  Pupils are equal, round, and reactive to light.  Cardiovascular:     Rate and Rhythm: Normal rate and regular rhythm.     Pulses: Normal pulses.     Heart sounds: Normal heart sounds.  Pulmonary:     Effort: Pulmonary effort is normal.     Breath sounds: Normal breath sounds.  Abdominal:     General: Abdomen is protuberant. Bowel sounds are normal.     Palpations: Abdomen is soft.     Hernia: A hernia is present. Hernia is present in the ventral area.  Musculoskeletal:        General: Normal range of motion.     Cervical back: Normal range of motion and neck supple.  Skin:    General: Skin is warm and dry.     Capillary Refill: Capillary refill takes less than 2  seconds.     Comments: Purple, seedy birth mark around eyes and nose of left side of face.  Neurological:     General: No focal deficit present.     Mental Status: He is alert and oriented to person, place, and time.  Psychiatric:        Mood and Affect: Mood normal.        Behavior: Behavior normal.        Thought Content: Thought content normal.        Judgment: Judgment normal.     BP 120/76   Pulse 67   Temp 98.6 F (37 C) (Temporal)   Resp 20   Ht 6' (1.829 m)   Wt 215 lb (97.5 kg)   SpO2 98%   BMI 29.16 kg/m   EKG- NSR-Mary-Margaret Hassell Done, FNP  Chest xray- no cardiopulmonary abnormalities     Assessment & Plan:  [facility] comes in today with chief complaint of Medical Management of Chronic Issues   Diagnosis and orders addressed:  1. Essential hypertension Low sodium diet. - CMP14+EGFR - CBC with Differential/Platelet  2. Benign prostatic hyperplasia without lower urinary tract symptoms Follow up with urology tomorrow as scheduled. - PSA, total and free  3. Mixed hyperlipidemia Low fat/low cholesterol diet. Weight-bearing exercise as tolerated. - Lipid panel  4. BMI 28.0-28.9,adult Discussed diet and exercise for person with BMI >25 Will recheck weight in 3-6 months  . Meds ordered this encounter  Medications  . lisinopril-hydrochlorothiazide (ZESTORETIC) 10-12.5 MG tablet    Sig: Take 1 tablet by mouth daily.    Dispense:  90 tablet    Refill:  1    Order Specific Question:   Supervising Provider    Answer:   Caryl Pina A [id number]  . simvastatin (ZOCOR) 40 MG tablet    Sig: Take 1 tablet (40 mg total) by mouth at bedtime.    Dispense:  90 tablet    Refill:  0    Order Specific Question:   Supervising Provider    Answer:   Caryl Pina A [id number]     Labs pending Health Maintenance reviewed Diet and exercise encouraged  Follow up plan: 6 months   Mary-Margaret Hassell Done, FNP

## 2019-04-01 NOTE — Patient Instructions (Signed)
Exercising to Stay Healthy To become healthy and stay healthy, it is recommended that you do moderate-intensity and vigorous-intensity exercise. You can tell that you are exercising at a moderate intensity if your heart starts beating faster and you start breathing faster but can still hold a conversation. You can tell that you are exercising at a vigorous intensity if you are breathing much harder and faster and cannot hold a conversation while exercising. Exercising regularly is important. It has many health benefits, such as:  Improving overall fitness, flexibility, and endurance.  Increasing bone density.  Helping with weight control.  Decreasing body fat.  Increasing muscle strength.  Reducing stress and tension.  Improving overall health. How often should I exercise? Choose an activity that you enjoy, and set realistic goals. Your health care provider can help you make an activity plan that works for you. Exercise regularly as told by your health care provider. This may include:  Doing strength training two times a week, such as: ? Lifting weights. ? Using resistance bands. ? Push-ups. ? Sit-ups. ? Yoga.  Doing a certain intensity of exercise for a given amount of time. Choose from these options: ? A total of 150 minutes of moderate-intensity exercise every week. ? A total of 75 minutes of vigorous-intensity exercise every week. ? A mix of moderate-intensity and vigorous-intensity exercise every week. Children, pregnant women, people who have not exercised regularly, people who are overweight, and older adults may need to talk with a health care provider about what activities are safe to do. If you have a medical condition, be sure to talk with your health care provider before you start a new exercise program. What are some exercise ideas? Moderate-intensity exercise ideas include:  Walking 1 mile (1.6 km) in about 15  minutes.  Biking.  Hiking.  Golfing.  Dancing.  Water aerobics. Vigorous-intensity exercise ideas include:  Walking 4.5 miles (7.2 km) or more in about 1 hour.  Jogging or running 5 miles (8 km) in about 1 hour.  Biking 10 miles (16.1 km) or more in about 1 hour.  Lap swimming.  Roller-skating or in-line skating.  Cross-country skiing.  Vigorous competitive sports, such as football, basketball, and soccer.  Jumping rope.  Aerobic dancing. What are some everyday activities that can help me to get exercise?  Yard work, such as: ? Pushing a lawn mower. ? Raking and bagging leaves.  Washing your car.  Pushing a stroller.  Shoveling snow.  Gardening.  Washing windows or floors. How can I be more active in my day-to-day activities?  Use stairs instead of an elevator.  Take a walk during your lunch break.  If you drive, park your car farther away from your work or school.  If you take public transportation, get off one stop early and walk the rest of the way.  Stand up or walk around during all of your indoor phone calls.  Get up, stretch, and walk around every 30 minutes throughout the day.  Enjoy exercise with a friend. Support to continue exercising will help you keep a regular routine of activity. What guidelines can I follow while exercising?  Before you start a new exercise program, talk with your health care provider.  Do not exercise so much that you hurt yourself, feel dizzy, or get very short of breath.  Wear comfortable clothes and wear shoes with good support.  Drink plenty of water while you exercise to prevent dehydration or heat stroke.  Work out until your breathing   and your heartbeat get faster. Where to find more information  U.S[place] Department of Health and Human Services: www.hhs.gov  Centers for Disease Control and Prevention (CDC): www.cdc.gov Summary  Exercising regularly is important. It will improve your overall fitness,  flexibility, and endurance.  Regular exercise also will improve your overall health. It can help you control your weight, reduce stress, and improve your bone density.  Do not exercise so much that you hurt yourself, feel dizzy, or get very short of breath.  Before you start a new exercise program, talk with your health care provider. This information is not intended to replace advice given to you by your health care provider. Make sure you discuss any questions you have with your health care provider. Document Revised: 01/17/2017 Document Reviewed: 12/26/2016 Elsevier Patient Education  2020 Elsevier Inc.  

## 2019-04-02 LAB — PSA, TOTAL AND FREE
PSA, Free Pct: 24.1 %
PSA, Free: 0.99 ng/mL
Prostate Specific Ag, Serum: 4.1 ng/mL — ABNORMAL HIGH (ref 0.0–4.0)

## 2019-04-02 LAB — CBC WITH DIFFERENTIAL/PLATELET
Basophils Absolute: 0.1 10*3/uL (ref 0.0–0.2)
Basos: 1 %
EOS (ABSOLUTE): 0.2 10*3/uL (ref 0.0–0.4)
Eos: 2 %
Hematocrit: 46.5 % (ref 37.5–51.0)
Hemoglobin: 16.3 g/dL (ref 13.0–17.7)
Immature Grans (Abs): 0 10*3/uL (ref 0.0–0.1)
Immature Granulocytes: 0 %
Lymphocytes Absolute: 2.7 10*3/uL (ref 0.7–3.1)
Lymphs: 21 %
MCH: 32.7 pg (ref 26.6–33.0)
MCHC: 35.1 g/dL (ref 31.5–35.7)
MCV: 93 fL (ref 79–97)
Monocytes Absolute: 1.3 10*3/uL — ABNORMAL HIGH (ref 0.1–0.9)
Monocytes: 10 %
Neutrophils Absolute: 8.8 10*3/uL — ABNORMAL HIGH (ref 1.4–7.0)
Neutrophils: 66 %
Platelets: 188 10*3/uL (ref 150–450)
RBC: 4.99 x10E6/uL (ref 4.14–5.80)
RDW: 12.8 % (ref 11.6–15.4)
WBC: 13.1 10*3/uL — ABNORMAL HIGH (ref 3.4–10.8)

## 2019-04-02 LAB — CMP14+EGFR
ALT: 21 IU/L (ref 0–44)
AST: 21 IU/L (ref 0–40)
Albumin/Globulin Ratio: 1.9 (ref 1.2–2.2)
Albumin: 4.6 g/dL (ref 3.7–4.7)
Alkaline Phosphatase: 47 IU/L (ref 39–117)
BUN/Creatinine Ratio: 8 — ABNORMAL LOW (ref 10–24)
BUN: 11 mg/dL (ref 8–27)
Bilirubin Total: 0.7 mg/dL (ref 0.0–1.2)
CO2: 22 mmol/L (ref 20–29)
Calcium: 9.7 mg/dL (ref 8.6–10.2)
Chloride: 101 mmol/L (ref 96–106)
Creatinine, Ser: 1.3 mg/dL — ABNORMAL HIGH (ref 0.76–1.27)
GFR calc Af Amer: 61 mL/min/{1.73_m2} (ref >59)
GFR calc non Af Amer: 53 mL/min/{1.73_m2} — ABNORMAL LOW (ref >59)
Globulin, Total: 2.4 g/dL (ref 1.5–4.5)
Glucose: 105 mg/dL — ABNORMAL HIGH (ref 65–99)
Potassium: 4.2 mmol/L (ref 3.5–5.2)
Sodium: 139 mmol/L (ref 134–144)
Total Protein: 7 g/dL (ref 6.0–8.5)

## 2019-04-02 LAB — LIPID PANEL
Chol/HDL Ratio: 2.9 ratio (ref 0.0–5.0)
Cholesterol, Total: 136 mg/dL (ref 100–199)
HDL: 47 mg/dL (ref >39)
LDL Chol Calc (NIH): 73 mg/dL (ref 0–99)
Triglycerides: 84 mg/dL (ref 0–149)
VLDL Cholesterol Cal: 16 mg/dL (ref 5–40)

## 2019-04-14 ENCOUNTER — Ambulatory Visit (INDEPENDENT_AMBULATORY_CARE_PROVIDER_SITE_OTHER): Payer: Medicare Other | Admitting: *Deleted

## 2019-04-14 DIAGNOSIS — Z0000 Encounter for general adult medical examination without abnormal findings: Secondary | ICD-10-CM

## 2019-04-14 NOTE — Progress Notes (Signed)
MEDICARE ANNUAL WELLNESS VISIT  04/14/2019  Telephone Visit Disclaimer This Medicare AWV was conducted by telephone due to national recommendations for restrictions regarding the COVID-19 Pandemic (e.g. social distancing).  I verified, using two identifiers, that I am speaking with Andre Estrada or their authorized healthcare agent. I discussed the limitations, risks, security, and privacy concerns of performing an evaluation and management service by telephone and the potential availability of an in-person appointment in the future. The patient expressed understanding and agreed to proceed.   Subjective:  Andre Estrada is a 79 y.o. male patient of Andre Estrada, [place] who had a Medicare Annual Wellness Visit today via telephone. Andre Estrada is Retired and lives with their son. he has 1 child. he reports that he is socially active and does interact with friends/family regularly. he is minimally physically active and enjoys gardening, yard work and Wellsite geologist.  Patient Care Team: Andre Pretty, FNP as PCP - General (Nurse Practitioner)  Advanced Directives 04/14/2019 10/02/2017 06/06/2017 02/14/2014  Does Patient Have a Medical Advance Directive? No No;Yes No Yes  Type of Advance Directive - Living will - -  Copy of [place] in Chart? - - - No - copy requested  Would patient like information on creating a medical advance directive? No - Patient declined - No - Patient declined -    Hospital Utilization Over the Past 12 Months: # of hospitalizations or ER visits: 0 # of surgeries: 0  Review of Systems    Patient reports that his overall health is unchanged compared to last year.  History obtained from chart review  Patient Reported Readings (BP, Pulse, CBG, Weight, etc) none  Pain Assessment Pain : No/denies pain     Current Medications & Allergies (verified) Allergies as of 04/14/2019      Reactions   Sulfa Antibiotics         Medication List       Accurate as of April 14, 2019  2:13 PM. If you have any questions, ask your nurse or doctor.        STOP taking these medications   aluminum-magnesium hydroxide 200-200 MG/5ML suspension     TAKE these medications   aspirin 81 MG tablet Take 81 mg by mouth daily.   finasteride 5 MG tablet Commonly known as: PROSCAR Take 5 mg by mouth daily.   lisinopril-hydrochlorothiazide 10-12.5 MG tablet Commonly known as: ZESTORETIC Take 1 tablet by mouth daily.   simvastatin 40 MG tablet Commonly known as: ZOCOR Take 1 tablet (40 mg total) by mouth at bedtime.   tamsulosin 0.4 MG Caps capsule Commonly known as: FLOMAX Take 0.4 mg by mouth daily after supper.       History (reviewed): Past Medical History:  Diagnosis Date  . Fracture of thumb    left  . Hyperlipidemia   . Hypertension   . Melanoma ([place])    BACK AND STOMACH   . Prostate enlargement   . Sinusitis   . Tobacco dependence    Past Surgical History:  Procedure Laterality Date  . LYMPH NODE BIOPSY    . MELANOMA EXCISION    . PARTIAL COLECTOMY    . SPLENECTOMY, TOTAL     Family History  Problem Relation Age of Onset  . Coronary artery disease Father   . COPD Father   . Prostatitis Brother   . Diabetes Son   . Prostatitis Brother   . Alcohol abuse Brother    Social History   Socioeconomic  History  . Marital status: Widowed    Spouse name: Not on file  . Number of children: 1  . Years of education: 60  . Highest education level: 9th grade  Occupational History  . Occupation: retired  Tobacco Use  . Smoking status: Current Every Day Smoker    Packs/day: 1.00    Years: 60.00    Pack years: 60.00    Types: Cigarettes  . Smokeless tobacco: Never Used  Substance and Sexual Activity  . Alcohol use: No  . Drug use: No  . Sexual activity: Not Currently  Other Topics Concern  . Not on file  Social History Narrative  . Not on file   Social Determinants of Health    Financial Resource Strain: Low Risk   . Difficulty of Paying Living Expenses: Not hard at all  Food Insecurity: No Food Insecurity  . Worried About Charity fundraiser in the Last Year: Never true  . Ran Out of Food in the Last Year: Never true  Transportation Needs: No Transportation Needs  . Lack of Transportation (Medical): No  . Lack of Transportation (Non-Medical): No  Physical Activity: Sufficiently Active  . Days of Exercise per Week: 7 days  . Minutes of Exercise per Session: 30 min  Stress: No Stress Concern Present  . Feeling of Stress : Not at all  Social Connections: Moderately Isolated  . Frequency of Communication with Friends and Family: More than three times a week  . Frequency of Social Gatherings with Friends and Family: More than three times a week  . Attends Religious Services: Never  . Active Member of Clubs or Organizations: No  . Attends Archivist Meetings: Never  . Marital Status: Widowed    Activities of Daily Living In your present state of health, do you have any difficulty performing the following activities: 04/14/2019  Hearing? N  Vision? N  Comment wears glasses-gets yearly eye exam  Difficulty concentrating or making decisions? N  Walking or climbing stairs? N  Dressing or bathing? N  Doing errands, shopping? N  Preparing Food and eating ? N  Using the Toilet? N  In the past six months, have you accidently leaked urine? N  Do you have problems with loss of bowel control? N  Managing your Medications? N  Managing your Finances? N  Housekeeping or managing your Housekeeping? N  Some recent data might be hidden    Patient Education/ Literacy How often do you need to have someone help you when you read instructions, pamphlets, or other written materials from your doctor or pharmacy?: 1 - Never What is the last grade level you completed in school?: 9th grade  Exercise Current Exercise Habits: Home exercise routine, Type of  exercise: walking, Time (Minutes): 30, Frequency (Times/Week): 7, Weekly Exercise (Minutes/Week): 210, Intensity: Mild, Exercise limited by: None identified  Diet Patient reports consuming 2 meals a day and 2 snack(s) a day Patient reports that his primary diet is: Regular Patient reports that she does have regular access to food.   Depression Screen PHQ 2/9 Scores 04/14/2019 04/01/2019 09/22/2018 03/24/2018 10/02/2017 09/19/2017 06/17/2017  PHQ - 2 Score 0 0 0 6 0 0 0  PHQ- 9 Score - - - 6 - - -     Fall Risk Fall Risk  04/14/2019 04/01/2019 09/22/2018 03/24/2018 10/02/2017  Falls in the past year? 0 0 0 0 No  Risk for fall due to : - - - - -  Objective:  [facility] seemed alert and oriented and he participated appropriately during our telephone visit.  Blood Pressure Weight BMI  BP Readings from Last 3 Encounters:  04/01/19 120/76  09/22/18 111/74  03/24/18 112/67   Wt Readings from Last 3 Encounters:  04/01/19 215 lb (97.5 kg)  09/22/18 216 lb (98 kg)  03/24/18 227 lb (103 kg)   BMI Readings from Last 1 Encounters:  04/01/19 29.16 kg/m    *Unable to obtain current vital signs, weight, and BMI due to telephone visit type  Hearing/Vision  . Kelechi did not seem to have difficulty with hearing/understanding during the telephone conversation . Reports that he has had a formal eye exam by an eye care professional within the past year . Reports that he has not had a formal hearing evaluation within the past year *Unable to fully assess hearing and vision during telephone visit type  Cognitive Function: 6CIT Screen 04/14/2019  What Year? 0 points  What month? 0 points  What time? 0 points  Count back from 20 0 points  Months in reverse 4 points  Repeat phrase 4 points  Total Score 8   (Normal:0-7, Significant for Dysfunction: >8)  Normal Cognitive Function Screening: No: Recommend repeat MMSE at next visit with PCP   Immunization & Health Maintenance Record Immunization  History  Administered Date(s) Administered  . Fluad Quad(high Dose 65+) 11/03/2018  . Influenza, High Dose Seasonal PF 10/14/2014, 10/29/2016  . Influenza,inj,Quad PF,6+ Mos 11/17/2013, 12/02/2016  . Influenza-Unspecified 10/07/2012, 10/20/2015, 12/17/2017  . Pneumococcal Conjugate-13 05/18/2014  . Pneumococcal Polysaccharide-23 02/18/2010, 10/29/2016    Health Maintenance  Topic Date Due  . TETANUS/TDAP  12/19/1959  . INFLUENZA VACCINE  Completed  . PNA vac Low Risk Adult  Completed       Assessment  This is a routine wellness examination for [facility].  Health Maintenance: Due or Overdue Health Maintenance Due  Topic Date Due  . Samul Dada  12/19/1959    [facility] does not need a referral for [employer] Assistance: Care Management:   no Social Work:    no Prescription Assistance:  no Nutrition/Diabetes Education:  no   Plan:  Personalized Goals Goals Addressed            This Visit's Progress   . DIET - INCREASE WATER INTAKE       Try to drink 6-8 glasses of water daily      Personalized Health Maintenance & Screening Recommendations  Td vaccine Shingles vaccine  Lung Cancer Screening Recommended: no (Low Dose CT Chest recommended if Age 78-80 years, 30 pack-year currently smoking OR have quit w/in past 15 years) Hepatitis C Screening recommended: no HIV Screening recommended: no  Advanced Directives: Written information was not prepared per patient's request.  Referrals & Orders No orders of the defined types were placed in this encounter.   Follow-up Plan . Follow-up with Andre Pretty, FNP as planned . Consider TDAP and Shingles vaccines at your next visit with your PCP   I have personally reviewed and noted the following in the patient's chart:   . Medical and social history . Use of alcohol, tobacco or illicit drugs  . Current medications and supplements . Functional ability and status . Nutritional status . Physical  activity . Advanced directives . List of other physicians . Hospitalizations, surgeries, and ER visits in previous 12 months . Vitals . Screenings to include cognitive, depression, and falls . Referrals and appointments  In addition, I have reviewed and  discussed with [facility] certain preventive protocols, quality metrics, and best practice recommendations. A written personalized care plan for preventive services as well as general preventive health recommendations is available and can be mailed to the patient at his request.      Milas Hock, LPN  [phone]

## 2019-04-14 NOTE — Patient Instructions (Signed)

## 2019-04-30 ENCOUNTER — Ambulatory Visit: Payer: Medicare Other | Admitting: Urology

## 2019-04-30 ENCOUNTER — Encounter: Payer: Self-pay | Admitting: Urology

## 2019-04-30 ENCOUNTER — Other Ambulatory Visit: Payer: Self-pay

## 2019-04-30 VITALS — BP 121/72 | HR 83 | Temp 97.3°F | Ht 72.0 in | Wt 215.0 lb

## 2019-04-30 DIAGNOSIS — C61 Malignant neoplasm of prostate: Secondary | ICD-10-CM

## 2019-04-30 DIAGNOSIS — N401 Enlarged prostate with lower urinary tract symptoms: Secondary | ICD-10-CM

## 2019-04-30 DIAGNOSIS — R351 Nocturia: Secondary | ICD-10-CM | POA: Diagnosis not present

## 2019-04-30 DIAGNOSIS — N138 Other obstructive and reflux uropathy: Secondary | ICD-10-CM | POA: Diagnosis not present

## 2019-04-30 DIAGNOSIS — R350 Frequency of micturition: Secondary | ICD-10-CM | POA: Diagnosis not present

## 2019-04-30 LAB — POCT URINALYSIS DIPSTICK
Bilirubin, UA: NEGATIVE
Glucose, UA: NEGATIVE
Ketones, UA: NEGATIVE
Leukocytes, UA: NEGATIVE
Nitrite, UA: NEGATIVE
Protein, UA: NEGATIVE
Spec Grav, UA: <=1.005 — AB (ref 1.010–1.025)
Urobilinogen, UA: NEGATIVE E.U./dL — AB
pH, UA: 6.5 (ref 5.0–8.0)

## 2019-04-30 MED ORDER — FINASTERIDE 5 MG PO TABS: 5.0000 mg | ORAL_TABLET | Freq: Every day | ORAL | 3 refills | Status: DC

## 2019-04-30 MED ORDER — TAMSULOSIN HCL 0.4 MG PO CAPS: 0.4000 mg | ORAL_CAPSULE | Freq: Every day | ORAL | 3 refills | Status: DC

## 2019-04-30 NOTE — Progress Notes (Signed)
Subjective:  1. Prostate cancer ([place])   2. BPH with urinary obstruction   3. Nocturia   4. Urinary frequency      I have prostate cancer. HPI: Andre Estrada is a 79 year-old male established patient who is here evaluation for treatment of prostate cancer.  His prostate cancer was diagnosed 06/17/2014. He does have the pathology report from his biopsy. His cancer was diagnosed by Jeffie Pollock. His most recent PSA is 4.1.   He has not undergone surgery for treatment. He has not undergone External Beam Radiation Therapy for treatment. He has not undergone Hormonal Therapy for treatment.   He does not have urinary incontinence. He does have problems with erectile dysfunction. He has not recently had unwanted weight loss. He is not having pain in new locations.   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 His PSA up to 4.1 from 3.8 at his last visit but it has been variable in the past and was 4.4 in 8/19.   He remains on finasteride.  He has no associated signs or symptoms.   He had a right base nodule and a prior negative biopsy in 2014. His PSA on 7/1 was 9.4 with 25% f/t ratio and it was 8.15 prior to the biopsy but it was down to 4.6 with a 38% f/t ratio on a PSA from October 2016. Most recent PSA was 5.3 08/05/16 which was a decline from 7.7 in 12/17. He has not had a repeat biopsy     CC: I have an enlarged prostate (follow-up). HPI: He is currently taking tamsulosin, proscar, and finasteride.   He is not having problems getting his urine stream started.   09/22/18: Andre Estrada remains on finasteride and tamsulosin with an IPSS of 5.  He has no hesitancy. He is content with his symptoms. He has had no gross hematuria.   He had retention on 05/22/17 and was started on finasteride after that.    IPSS    Row Name 04/30/19 0900         International Prostate Symptom Score   How often have you had the sensation of not  emptying your bladder?  About half the time     How often have you had to urinate less than every two hours?  Less than half the time     How often have you found you stopped and started again several times when you urinated?  Not at All     How often have you found it difficult to postpone urination?  Not at All     How often have you had a weak urinary stream?  Not at All     How often have you had to strain to start urination?  Not at All     How many times did you typically get up at night to urinate?  None     Total IPSS Score  5       Quality of Life due to urinary symptoms   If you were to spend the rest of your life with your urinary condition just the way it is now how would you feel about that?  Pleased         ROS:  ROS:  A complete review of systems was performed.  All systems are negative except for pertinent findings as noted.   Review of Systems  All other systems reviewed and  are negative.   Allergies  Allergen Reactions  . Sulfa Antibiotics     Outpatient Encounter Medications as of 04/30/2019  Medication Sig  . aspirin 81 MG tablet Take 81 mg by mouth daily.  . finasteride (PROSCAR) 5 MG tablet Take 1 tablet (5 mg total) by mouth daily.  Marland Kitchen lisinopril-hydrochlorothiazide (ZESTORETIC) 10-12.5 MG tablet Take 1 tablet by mouth daily.  . simvastatin (ZOCOR) 40 MG tablet Take 1 tablet (40 mg total) by mouth at bedtime.  . tamsulosin (FLOMAX) 0.4 MG CAPS capsule Take 1 capsule (0.4 mg total) by mouth daily after supper.  . [DISCONTINUED] finasteride (PROSCAR) 5 MG tablet Take 5 mg by mouth daily.  . [DISCONTINUED] tamsulosin (FLOMAX) 0.4 MG CAPS capsule Take 0.4 mg by mouth daily after supper.    No facility-administered encounter medications on file as of 04/30/2019.    Past Medical History:  Diagnosis Date  . Fracture of thumb    left  . Hyperlipidemia   . Hypertension   . Melanoma ([place])    BACK AND STOMACH   . Prostate enlargement   . Sinusitis   .  Tobacco dependence     Past Surgical History:  Procedure Laterality Date  . LYMPH NODE BIOPSY    . MELANOMA EXCISION    . PARTIAL COLECTOMY    . SPLENECTOMY, TOTAL      Social History   Socioeconomic History  . Marital status: Widowed    Spouse name: Not on file  . Number of children: 1  . Years of education: 34  . Highest education level: 9th grade  Occupational History  . Occupation: retired  Tobacco Use  . Smoking status: Current Every Day Smoker    Packs/day: 1.00    Years: 60.00    Pack years: 60.00    Types: Cigarettes  . Smokeless tobacco: Never Used  Substance and Sexual Activity  . Alcohol use: No  . Drug use: No  . Sexual activity: Not Currently  Other Topics Concern  . Not on file  Social History Narrative  . Not on file   Social Determinants of Health   Financial Resource Strain: Low Risk   . Difficulty of Paying Living Expenses: Not hard at all  Food Insecurity: No Food Insecurity  . Worried About Charity fundraiser in the Last Year: Never true  . Ran Out of Food in the Last Year: Never true  Transportation Needs: No Transportation Needs  . Lack of Transportation (Medical): No  . Lack of Transportation (Non-Medical): No  Physical Activity: Sufficiently Active  . Days of Exercise per Week: 7 days  . Minutes of Exercise per Session: 30 min  Stress: No Stress Concern Present  . Feeling of Stress : Not at all  Social Connections: Moderately Isolated  . Frequency of Communication with Friends and Family: More than three times a week  . Frequency of Social Gatherings with Friends and Family: More than three times a week  . Attends Religious Services: Never  . Active Member of Clubs or Organizations: No  . Attends Archivist Meetings: Never  . Marital Status: Widowed  Intimate Partner Violence: Not At Risk  . Fear of Current or Ex-Partner: No  . Emotionally Abused: No  . Physically Abused: No  . Sexually Abused: No    Family History   Problem Relation Age of Onset  . Coronary artery disease Father   . COPD Father   . Prostatitis Brother   . Diabetes Son   .  Prostatitis Brother   . Alcohol abuse Brother        Objective: BP 121/72   Pulse 83   Temp (!) 97.3 F (36.3 C)   Ht 6' (1.829 m)   Wt 215 lb (97.5 kg)   BMI 29.16 kg/m     Physical Exam Vitals reviewed.  Constitutional:      Appearance: Normal appearance.  Genitourinary:    Comments: AP normal NST without mass. Prostate 3+ without nodules.  SV's non-palpable.  Neurological:     Mental Status: He is alert.     Lab Results:  Results for orders placed or performed in visit on 04/30/19 (from the past 24 hour(s))  POCT urinalysis dipstick     Status: Abnormal   Collection Time: 04/30/19  9:03 AM  Result Value Ref Range   Color, UA yellow    Clarity, UA clear    Glucose, UA Negative Negative   Bilirubin, UA neg    Ketones, UA neg    Spec Grav, UA <=1.005 (A) 1.010 - 1.025   Blood, UA Intact    pH, UA 6.5 5.0 - 8.0   Protein, UA Negative Negative   Urobilinogen, UA negative (A) 0.2 or 1.0 E.U./dL   Nitrite, UA neg    Leukocytes, UA Negative Negative   Appearance clear    Odor      BMET No results for input(s): NA, K, CL, CO2, GLUCOSE, BUN, CREATININE, CALCIUM in the last 72 hours. PSA 4.1  Prior records and IPSS reviewed.    Studies/Results: No results found.    Assessment & Plan: Prostate cancer on surveillance.   PSA is minimally changed on finasteride.   He will continue that medication.  If the PSA rises, I will consider an MRI.   BPH with BOO and frequency.  He is doing well on tamsulosin and finasteride which were refilled.     Meds ordered this encounter  Medications  . finasteride (PROSCAR) 5 MG tablet    Sig: Take 1 tablet (5 mg total) by mouth daily.    Dispense:  90 tablet    Refill:  3  . tamsulosin (FLOMAX) 0.4 MG CAPS capsule    Sig: Take 1 capsule (0.4 mg total) by mouth daily after supper.     Dispense:  90 capsule    Refill:  3     Orders Placed This Encounter  Procedures  . PSA    Standing Status:   Future    Standing Expiration Date:   04/29/2020  . POCT urinalysis dipstick      Return in about 6 months (around 10/31/2019) for with a PSA. .   CC: Chevis Pretty, FNP      Irine Seal 04/30/2019

## 2019-06-02 ENCOUNTER — Telehealth: Payer: Self-pay | Admitting: Nurse Practitioner

## 2019-06-02 NOTE — Chronic Care Management (AMB) (Signed)
  Chronic Care Management   Outreach Note  06/02/2019 Name: Andre Estrada MRN: [id number] DOB: [birth year]  Andre Estrada is a 79 y.o. year old male who is a primary care patient of Chevis Pretty, [place]. I reached out to [facility] by phone today in response to a referral sent by Mr. [facility] health plan.     An unsuccessful telephone outreach was attempted today. The patient was referred to the case management team for assistance with care management and care coordination.   Follow Up Plan: A HIPPA compliant phone message was left for the patient providing contact information and requesting a return call.  The care management team will reach out to the patient again over the next 7 days. If patient returns call to provider office, please advise to call [facility] [place] at [phone].  [place],  [address] Direct Dial: [phone] [email or web address]: [email or web address]

## 2019-06-04 NOTE — Chronic Care Management (AMB) (Signed)
  Chronic Care Management   Note  06/04/2019 Name: Andre Estrada MRN: [id number] DOB: [birth year]  Andre Estrada is a 79 y.o. year old male who is a primary care patient of Chevis Pretty, [place]. I reached out to [facility] by phone today in response to a referral sent by [name] health plan.     Andre Estrada was given information about Chronic Care Management services today including:  1. CCM service includes personalized support from designated clinical staff supervised by his physician, including individualized plan of care and coordination with other care providers 2. 24/7 contact phone numbers for assistance for urgent and routine care needs. 3. Service will only be billed when office clinical staff spend 20 minutes or more in a month to coordinate care. 4. Only one practitioner may furnish and bill the service in a calendar month. 5. The patient may stop CCM services at any time (effective at the end of the month) by phone call to the office staff. 6. The patient will be responsible for cost sharing (co-pay) of up to 20% of the service fee (after annual deductible is met).  Patient did not agree to enrollment in care management services and does not wish to consider at this time.  Follow up plan: The patient has been provided with contact information for the care management team and has been advised to call with any health related questions or concerns. If patient returns call to provider office, please advise to call [place] at [phone].  [place], [place] [address] Direct Dial: [phone] [email or web address] Website: [email or web address]

## 2019-06-04 NOTE — Chronic Care Management (AMB) (Signed)
  Chronic Care Management   Outreach Note  06/04/2019 Name: Andre Estrada MRN: [id number] DOB: 1940-04-19  Andre Estrada is a 79 y.o. year old male who is a primary care patient of Chevis Pretty, [place]. I reached out to [facility] by phone today in response to a referral sent by Mr. [facility] health plan.     A second unsuccessful telephone outreach was attempted today. The patient was referred to the case management team for assistance with care management and care coordination.   Follow Up Plan: A HIPPA compliant phone message was left for the patient providing contact information and requesting a return call. The care management team will reach out to the patient again over the next 7 days. If patient returns call to provider office, please advise to call [place] at [phone].  [place], [place] [address] Direct Dial: [phone] [email or web address] Website: [email or web address]

## 2019-09-30 ENCOUNTER — Encounter: Payer: Self-pay | Admitting: Nurse Practitioner

## 2019-09-30 ENCOUNTER — Other Ambulatory Visit: Payer: Self-pay

## 2019-09-30 ENCOUNTER — Ambulatory Visit (INDEPENDENT_AMBULATORY_CARE_PROVIDER_SITE_OTHER): Payer: Medicare Other | Admitting: Nurse Practitioner

## 2019-09-30 VITALS — BP 117/79 | HR 61 | Temp 98.2°F | Resp 20 | Ht 72.0 in | Wt 207.0 lb

## 2019-09-30 DIAGNOSIS — N400 Enlarged prostate without lower urinary tract symptoms: Secondary | ICD-10-CM | POA: Diagnosis not present

## 2019-09-30 DIAGNOSIS — E782 Mixed hyperlipidemia: Secondary | ICD-10-CM | POA: Diagnosis not present

## 2019-09-30 DIAGNOSIS — K432 Incisional hernia without obstruction or gangrene: Secondary | ICD-10-CM | POA: Diagnosis not present

## 2019-09-30 DIAGNOSIS — I10 Essential (primary) hypertension: Secondary | ICD-10-CM

## 2019-09-30 DIAGNOSIS — Z6828 Body mass index (BMI) 28.0-28.9, adult: Secondary | ICD-10-CM

## 2019-09-30 MED ORDER — SIMVASTATIN 40 MG PO TABS: 40.0000 mg | ORAL_TABLET | Freq: Every day | ORAL | 1 refills | Status: DC

## 2019-09-30 MED ORDER — LISINOPRIL-HYDROCHLOROTHIAZIDE 10-12.5 MG PO TABS: 1.0000 | ORAL_TABLET | Freq: Every day | ORAL | 1 refills | Status: DC

## 2019-09-30 NOTE — Progress Notes (Signed)
Subjective:    Patient ID: Andre Estrada, male    DOB: 04/28/1940, 79 y.o.   MRN: [id number]   Chief Complaint: Medical Management of Chronic Issues    HPI:  1. Essential hypertension No c/o chest pain, sob or headache. Does not check blood pressure at home. BP Readings from Last 3 Encounters:  04/30/19 121/72  04/01/19 120/76  09/22/18 111/74     2. Mixed hyperlipidemia Does not watch diet and does little to no exercise. Lab Results  Component Value Date   CHOL 136 04/01/2019   HDL 47 04/01/2019   LDLCALC 73 04/01/2019   TRIG 84 04/01/2019   CHOLHDL 2.9 04/01/2019   The 10-year ASCVD risk score Mikey Bussing DC Jr., et al., 2013) is: 28%   Values used to calculate the score:     Age: 67 years     Sex: Male     Is Non-Hispanic African American: No     Diabetic: No     Tobacco smoker: Yes     Systolic Blood Pressure: 160 mmHg     Is BP treated: Yes     HDL Cholesterol: 47 mg/dL     Total Cholesterol: 136 mg/dL   3. Benign prostatic hyperplasia without lower urinary tract symptoms Is on proscar and flomax. Denies any problems voiding. Sees urology very 6 months. Lab Results  Component Value Date   PSA1 4.1 (H) 04/01/2019   PSA1 3.8 09/22/2018   PSA1 3.6 03/24/2018   PSA 6.5 (H) 02/14/2014      4. Incisional hernia, without obstruction or gangrene Denies any abdominal wall pain.  5. BMI 28.0-28.9,adult Weight is down 8lbs from previous  Wt Readings from Last 3 Encounters:  09/30/19 207 lb (93.9 kg)  04/30/19 215 lb (97.5 kg)  04/01/19 215 lb (97.5 kg)    BMI Readings from Last 3 Encounters:  09/30/19 28.07 kg/m  04/30/19 29.16 kg/m  04/01/19 29.16 kg/m        Outpatient Encounter Medications as of 09/30/2019  Medication Sig  . aspirin 81 MG tablet Take 81 mg by mouth daily.  . finasteride (PROSCAR) 5 MG tablet Take 1 tablet (5 mg total) by mouth daily.  Marland Kitchen lisinopril-hydrochlorothiazide (ZESTORETIC) 10-12.5 MG tablet Take 1 tablet by mouth  daily.  . simvastatin (ZOCOR) 40 MG tablet Take 1 tablet (40 mg total) by mouth at bedtime.  . tamsulosin (FLOMAX) 0.4 MG CAPS capsule Take 1 capsule (0.4 mg total) by mouth daily after supper.     Past Surgical History:  Procedure Laterality Date  . LYMPH NODE BIOPSY    . MELANOMA EXCISION    . PARTIAL COLECTOMY    . SPLENECTOMY, TOTAL      Family History  Problem Relation Age of Onset  . Coronary artery disease Father   . COPD Father   . Prostatitis Brother   . Diabetes Son   . Prostatitis Brother   . Alcohol abuse Brother     New complaints: None today  Social history: Still living with his son  Controlled substance contract: n/a    Review of Systems  Constitutional: Negative for diaphoresis.  Eyes: Negative for pain.  Respiratory: Negative for shortness of breath.   Cardiovascular: Negative for chest pain, palpitations and leg swelling.  Gastrointestinal: Negative for abdominal pain.  Endocrine: Negative for polydipsia.  Skin: Negative for rash.  Neurological: Negative for dizziness, weakness and headaches.  Hematological: Does not bruise/bleed easily.  All other systems reviewed and are negative.  Objective:   Physical Exam Vitals and nursing note reviewed.  Constitutional:      Appearance: Normal appearance. He is well-developed.  HENT:     Head: Normocephalic.     Nose: Nose normal.  Eyes:     Pupils: Pupils are equal, round, and reactive to light.  Neck:     Thyroid: No thyroid mass or thyromegaly.     Vascular: No carotid bruit or JVD.     Trachea: Phonation normal.  Cardiovascular:     Rate and Rhythm: Normal rate and regular rhythm.  Pulmonary:     Effort: Pulmonary effort is normal. No respiratory distress.     Breath sounds: Normal breath sounds.  Abdominal:     General: Bowel sounds are normal.     Palpations: Abdomen is soft.     Tenderness: There is no abdominal tenderness.     Hernia: A hernia (large non tender incisional  hernia midline of abdomnen.) is present.  Musculoskeletal:        General: Normal range of motion.     Cervical back: Normal range of motion and neck supple.  Lymphadenopathy:     Cervical: No cervical adenopathy.  Skin:    General: Skin is warm and dry.     Comments: Birthmark on left cheek and up to left lower eye lid. No changes from previous  Neurological:     Mental Status: He is alert and oriented to person, place, and time.  Psychiatric:        Behavior: Behavior normal.        Thought Content: Thought content normal.        Judgment: Judgment normal.    BP 117/79   Pulse 61   Temp 98.2 F (36.8 C) (Temporal)   Resp 20   Ht 6' (1.829 m)   Wt 207 lb (93.9 kg)   SpO2 97%   BMI 28.07 kg/m         Assessment & Plan:  [facility] comes in today with chief complaint of Medical Management of Chronic Issues   Diagnosis and orders addressed:  1. Essential hypertension Low sodium diet - CBC with Differential/Platelet - CMP14+EGFR - lisinopril-hydrochlorothiazide (ZESTORETIC) 10-12.5 MG tablet; Take 1 tablet by mouth daily.  Dispense: 90 tablet; Refill: 1  2. Mixed hyperlipidemia Low fat diet - Lipid panel - simvastatin (ZOCOR) 40 MG tablet; Take 1 tablet (40 mg total) by mouth at bedtime.  Dispense: 90 tablet; Refill: 1  3. Benign prostatic hyperplasia without lower urinary tract symptoms Will send lab results to urology - PSA, total and free  4. Incisional hernia, without obstruction or gangrene Report any pain  5. BMI 28.0-28.9,adult Discussed diet and exercise for person with BMI >25 Will recheck weight in 3-6 months   Labs pending Health Maintenance reviewed Diet and exercise encouraged  Follow up plan: 6 months   Mary-Margaret Hassell Done, FNP

## 2019-09-30 NOTE — Patient Instructions (Signed)
DASH Eating Plan DASH stands for "Dietary Approaches to Stop Hypertension." The DASH eating plan is a healthy eating plan that has been shown to reduce high blood pressure (hypertension). It may also reduce your risk for type 2 diabetes, heart disease, and stroke. The DASH eating plan may also help with weight loss. What are tips for following this plan?  General guidelines  Avoid eating more than 2,300 mg (milligrams) of salt (sodium) a day. If you have hypertension, you may need to reduce your sodium intake to 1,500 mg a day.  Limit alcohol intake to no more than 1 drink a day for nonpregnant women and 2 drinks a day for men. One drink equals 12 oz of beer, 5 oz of wine, or 1 oz of hard liquor.  Work with your health care provider to maintain a healthy body weight or to lose weight. Ask what an ideal weight is for you.  Get at least 30 minutes of exercise that causes your heart to beat faster (aerobic exercise) most days of the week. Activities may include walking, swimming, or biking.  Work with your health care provider or diet and nutrition specialist (dietitian) to adjust your eating plan to your individual calorie needs. Reading food labels   Check food labels for the amount of sodium per serving. Choose foods with less than 5 percent of the Daily Value of sodium. Generally, foods with less than 300 mg of sodium per serving fit into this eating plan.  To find whole grains, look for the word "whole" as the first word in the ingredient list. Shopping  Buy products labeled as "low-sodium" or "no salt added."  Buy fresh foods. Avoid canned foods and premade or frozen meals. Cooking  Avoid adding salt when cooking. Use salt-free seasonings or herbs instead of table salt or sea salt. Check with your health care provider or pharmacist before using salt substitutes.  Do not fry foods. Cook foods using healthy methods such as baking, boiling, grilling, and broiling instead.  Cook with  heart-healthy oils, such as olive, canola, soybean, or sunflower oil. Meal planning  Eat a balanced diet that includes: ? 5 or more servings of fruits and vegetables each day. At each meal, try to fill half of your plate with fruits and vegetables. ? Up to 6-8 servings of whole grains each day. ? Less than 6 oz of lean meat, poultry, or fish each day. A 3-oz serving of meat is about the same size as a deck of cards. One egg equals 1 oz. ? 2 servings of low-fat dairy each day. ? A serving of nuts, seeds, or beans 5 times each week. ? Heart-healthy fats. Healthy fats called Omega-3 fatty acids are found in foods such as flaxseeds and coldwater fish, like sardines, salmon, and mackerel.  Limit how much you eat of the following: ? Canned or prepackaged foods. ? Food that is high in trans fat, such as fried foods. ? Food that is high in saturated fat, such as fatty meat. ? Sweets, desserts, sugary drinks, and other foods with added sugar. ? Full-fat dairy products.  Do not salt foods before eating.  Try to eat at least 2 vegetarian meals each week.  Eat more home-cooked food and less restaurant, buffet, and fast food.  When eating at a restaurant, ask that your food be prepared with less salt or no salt, if possible. What foods are recommended? The items listed may not be a complete list. Talk with your dietitian about   what dietary choices are best for you. Grains Whole-grain or whole-wheat bread. Whole-grain or whole-wheat pasta. Brown rice. Oatmeal. Quinoa. Bulgur. Whole-grain and low-sodium cereals. Pita bread. Low-fat, low-sodium crackers. Whole-wheat flour tortillas. Vegetables Fresh or frozen vegetables (raw, steamed, roasted, or grilled). Low-sodium or reduced-sodium tomato and vegetable juice. Low-sodium or reduced-sodium tomato sauce and tomato paste. Low-sodium or reduced-sodium canned vegetables. Fruits All fresh, dried, or frozen fruit. Canned fruit in natural juice (without  added sugar). Meat and other protein foods Skinless chicken or turkey. Ground chicken or turkey. Pork with fat trimmed off. Fish and seafood. Egg whites. Dried beans, peas, or lentils. Unsalted nuts, nut butters, and seeds. Unsalted canned beans. Lean cuts of beef with fat trimmed off. Low-sodium, lean deli meat. Dairy Low-fat (1%) or fat-free (skim) milk. Fat-free, low-fat, or reduced-fat cheeses. Nonfat, low-sodium ricotta or cottage cheese. Low-fat or nonfat yogurt. Low-fat, low-sodium cheese. Fats and oils Soft margarine without trans fats. Vegetable oil. Low-fat, reduced-fat, or light mayonnaise and salad dressings (reduced-sodium). Canola, safflower, olive, soybean, and sunflower oils. Avocado. Seasoning and other foods Herbs. Spices. Seasoning mixes without salt. Unsalted popcorn and pretzels. Fat-free sweets. What foods are not recommended? The items listed may not be a complete list. Talk with your dietitian about what dietary choices are best for you. Grains Baked goods made with fat, such as croissants, muffins, or some breads. Dry pasta or rice meal packs. Vegetables Creamed or fried vegetables. Vegetables in a cheese sauce. Regular canned vegetables (not low-sodium or reduced-sodium). Regular canned tomato sauce and paste (not low-sodium or reduced-sodium). Regular tomato and vegetable juice (not low-sodium or reduced-sodium). Pickles. Olives. Fruits Canned fruit in a light or heavy syrup. Fried fruit. Fruit in cream or butter sauce. Meat and other protein foods Fatty cuts of meat. Ribs. Fried meat. Bacon. Sausage. Bologna and other processed lunch meats. Salami. Fatback. Hotdogs. Bratwurst. Salted nuts and seeds. Canned beans with added salt. Canned or smoked fish. Whole eggs or egg yolks. Chicken or turkey with skin. Dairy Whole or 2% milk, cream, and half-and-half. Whole or full-fat cream cheese. Whole-fat or sweetened yogurt. Full-fat cheese. Nondairy creamers. Whipped toppings.  Processed cheese and cheese spreads. Fats and oils Butter. Stick margarine. Lard. Shortening. Ghee. Bacon fat. Tropical oils, such as coconut, palm kernel, or palm oil. Seasoning and other foods Salted popcorn and pretzels. Onion salt, garlic salt, seasoned salt, table salt, and sea salt. Worcestershire sauce. Tartar sauce. Barbecue sauce. Teriyaki sauce. Soy sauce, including reduced-sodium. Steak sauce. Canned and packaged gravies. Fish sauce. Oyster sauce. Cocktail sauce. Horseradish that you find on the shelf. Ketchup. Mustard. Meat flavorings and tenderizers. Bouillon cubes. Hot sauce and Tabasco sauce. Premade or packaged marinades. Premade or packaged taco seasonings. Relishes. Regular salad dressings. Where to find more information:  National Heart, Lung, and Blood Institute: www.nhlbi.nih.gov  American Heart Association: www.heart.org Summary  The DASH eating plan is a healthy eating plan that has been shown to reduce high blood pressure (hypertension). It may also reduce your risk for type 2 diabetes, heart disease, and stroke.  With the DASH eating plan, you should limit salt (sodium) intake to 2,300 mg a day. If you have hypertension, you may need to reduce your sodium intake to 1,500 mg a day.  When on the DASH eating plan, aim to eat more fresh fruits and vegetables, whole grains, lean proteins, low-fat dairy, and heart-healthy fats.  Work with your health care provider or diet and nutrition specialist (dietitian) to adjust your eating plan to your   individual calorie needs. This information is not intended to replace advice given to you by your health care provider. Make sure you discuss any questions you have with your health care provider. Document Revised: 01/17/2017 Document Reviewed: 01/29/2016 Elsevier Patient Education  2020 [employer]  

## 2019-10-01 LAB — CMP14+EGFR
ALT: 18 IU/L (ref 0–44)
AST: 22 IU/L (ref 0–40)
Albumin/Globulin Ratio: 2 (ref 1.2–2.2)
Albumin: 4.5 g/dL (ref 3.7–4.7)
Alkaline Phosphatase: 37 IU/L — ABNORMAL LOW (ref 48–121)
BUN/Creatinine Ratio: 14 (ref 10–24)
BUN: 18 mg/dL (ref 8–27)
Bilirubin Total: 0.9 mg/dL (ref 0.0–1.2)
CO2: 25 mmol/L (ref 20–29)
Calcium: 9.8 mg/dL (ref 8.6–10.2)
Chloride: 99 mmol/L (ref 96–106)
Creatinine, Ser: 1.3 mg/dL — ABNORMAL HIGH (ref 0.76–1.27)
GFR calc Af Amer: 61 mL/min/{1.73_m2} (ref >59)
GFR calc non Af Amer: 53 mL/min/{1.73_m2} — ABNORMAL LOW (ref >59)
Globulin, Total: 2.2 g/dL (ref 1.5–4.5)
Glucose: 105 mg/dL — ABNORMAL HIGH (ref 65–99)
Potassium: 3.8 mmol/L (ref 3.5–5.2)
Sodium: 138 mmol/L (ref 134–144)
Total Protein: 6.7 g/dL (ref 6.0–8.5)

## 2019-10-01 LAB — CBC WITH DIFFERENTIAL/PLATELET
Basophils Absolute: 0.1 10*3/uL (ref 0.0–0.2)
Basos: 1 %
EOS (ABSOLUTE): 0.4 10*3/uL (ref 0.0–0.4)
Eos: 5 %
Hematocrit: 43 % (ref 37.5–51.0)
Hemoglobin: 15 g/dL (ref 13.0–17.7)
Immature Grans (Abs): 0 10*3/uL (ref 0.0–0.1)
Immature Granulocytes: 0 %
Lymphocytes Absolute: 2.6 10*3/uL (ref 0.7–3.1)
Lymphs: 35 %
MCH: 33.3 pg — ABNORMAL HIGH (ref 26.6–33.0)
MCHC: 34.9 g/dL (ref 31.5–35.7)
MCV: 95 fL (ref 79–97)
Monocytes Absolute: 0.8 10*3/uL (ref 0.1–0.9)
Monocytes: 11 %
Neutrophils Absolute: 3.5 10*3/uL (ref 1.4–7.0)
Neutrophils: 48 %
Platelets: 154 10*3/uL (ref 150–450)
RBC: 4.51 x10E6/uL (ref 4.14–5.80)
RDW: 12.6 % (ref 11.6–15.4)
WBC: 7.3 10*3/uL (ref 3.4–10.8)

## 2019-10-01 LAB — PSA, TOTAL AND FREE
PSA, Free Pct: 26.1 %
PSA, Free: 0.73 ng/mL
Prostate Specific Ag, Serum: 2.8 ng/mL (ref 0.0–4.0)

## 2019-10-01 LAB — LIPID PANEL
Chol/HDL Ratio: 3.1 ratio (ref 0.0–5.0)
Cholesterol, Total: 144 mg/dL (ref 100–199)
HDL: 46 mg/dL (ref >39)
LDL Chol Calc (NIH): 79 mg/dL (ref 0–99)
Triglycerides: 105 mg/dL (ref 0–149)
VLDL Cholesterol Cal: 19 mg/dL (ref 5–40)

## 2019-10-07 ENCOUNTER — Other Ambulatory Visit: Payer: Self-pay

## 2019-10-07 DIAGNOSIS — C61 Malignant neoplasm of prostate: Secondary | ICD-10-CM

## 2019-11-01 ENCOUNTER — Other Ambulatory Visit: Payer: Medicare Other

## 2019-11-04 NOTE — Progress Notes (Signed)
Subjective:  1. Prostate cancer ([place])   2. BPH with urinary obstruction   3. Nocturia       He has T2a Gleason 6 prostate cancer found in a single core in the right apex with 5% involvement on biopsy in 4/16. His PSA is back down to 2.8 with a 26% f/t ratio after rising to 4.1 from 3.8 but it has been variable in the past and was 4.4 in 8/19.   He remains on finasteride.  He has no associated signs or symptoms.   He had a right base nodule and a prior negative biopsy in 2014. His PSA on 7/1 was 9.4 with 25% f/t ratio and it was 8.15 prior to the biopsy but it was down to 4.6 with a 38% f/t ratio on a PSA from October 2016. Most recent PSA was 5.3 08/05/16 which was a decline from 7.7 in 12/17. He has not had a repeat biopsy     CC: I have an enlarged prostate (follow-up).  11/05/19: Andre Estrada remains on finasteride and tamsulosin with an IPSS of 2.  He has no hesitancy. He is content with his symptoms. He has had no gross hematuria.   He had retention on 05/22/17 and was started on finasteride after that.     IPSS    Row Name 11/05/19 0900         International Prostate Symptom Score   How often have you had the sensation of not emptying your bladder? Less than 1 in 5     How often have you had to urinate less than every two hours? Not at All     How often have you found you stopped and started again several times when you urinated? Not at All     How often have you found it difficult to postpone urination? Not at All     How often have you had a weak urinary stream? Not at All     How often have you had to strain to start urination? Not at All     How many times did you typically get up at night to urinate? 1 Time     Total IPSS Score 2       Quality of Life due to urinary symptoms   If you were to spend the rest of your life with your urinary condition just the way it is now how would you feel about that? Pleased             ROS:  ROS:  A complete review of systems was  performed.  All systems are negative except for pertinent findings as noted.   Review of Systems  All other systems reviewed and are negative.   Allergies  Allergen Reactions  . Sulfa Antibiotics     Outpatient Encounter Medications as of 11/05/2019  Medication Sig  . aspirin 81 MG tablet Take 81 mg by mouth daily.  . finasteride (PROSCAR) 5 MG tablet Take 1 tablet (5 mg total) by mouth daily.  Marland Kitchen lisinopril-hydrochlorothiazide (ZESTORETIC) 10-12.5 MG tablet Take 1 tablet by mouth daily.  . simvastatin (ZOCOR) 40 MG tablet Take 1 tablet (40 mg total) by mouth at bedtime.  . tamsulosin (FLOMAX) 0.4 MG CAPS capsule Take 1 capsule (0.4 mg total) by mouth daily after supper.  . [DISCONTINUED] finasteride (PROSCAR) 5 MG tablet Take 1 tablet (5 mg total) by mouth daily.  . [DISCONTINUED] tamsulosin (FLOMAX) 0.4 MG CAPS capsule Take 1 capsule (0.4 mg total)  by mouth daily after supper.   No facility-administered encounter medications on file as of 11/05/2019.    Past Medical History:  Diagnosis Date  . Fracture of thumb    left  . Hyperlipidemia   . Hypertension   . Melanoma ([place])    BACK AND STOMACH   . Prostate enlargement   . Sinusitis   . Tobacco dependence     Past Surgical History:  Procedure Laterality Date  . LYMPH NODE BIOPSY    . MELANOMA EXCISION    . PARTIAL COLECTOMY    . SPLENECTOMY, TOTAL      Social History   Socioeconomic History  . Marital status: Widowed    Spouse name: Not on file  . Number of children: 1  . Years of education: 66  . Highest education level: 9th grade  Occupational History  . Occupation: retired  Tobacco Use  . Smoking status: Current Every Day Smoker    Packs/day: 1.00    Years: 60.00    Pack years: 60.00    Types: Cigarettes  . Smokeless tobacco: Never Used  Vaping Use  . Vaping Use: Never used  Substance and Sexual Activity  . Alcohol use: No  . Drug use: No  . Sexual activity: Not Currently  Other Topics Concern  . Not  on file  Social History Narrative  . Not on file   Social Determinants of Health   Financial Resource Strain: Low Risk   . Difficulty of Paying Living Expenses: Not hard at all  Food Insecurity: No Food Insecurity  . Worried About Charity fundraiser in the Last Year: Never true  . Ran Out of Food in the Last Year: Never true  Transportation Needs: No Transportation Needs  . Lack of Transportation (Medical): No  . Lack of Transportation (Non-Medical): No  Physical Activity: Sufficiently Active  . Days of Exercise per Week: 7 days  . Minutes of Exercise per Session: 30 min  Stress: No Stress Concern Present  . Feeling of Stress : Not at all  Social Connections: Socially Isolated  . Frequency of Communication with Friends and Family: More than three times a week  . Frequency of Social Gatherings with Friends and Family: More than three times a week  . Attends Religious Services: Never  . Active Member of Clubs or Organizations: No  . Attends Archivist Meetings: Never  . Marital Status: Widowed  Intimate Partner Violence: Not At Risk  . Fear of Current or Ex-Partner: No  . Emotionally Abused: No  . Physically Abused: No  . Sexually Abused: No    Family History  Problem Relation Age of Onset  . Coronary artery disease Father   . COPD Father   . Prostatitis Brother   . Diabetes Son   . Prostatitis Brother   . Alcohol abuse Brother        Objective: BP 118/82   Pulse 83   Temp 97.7 F (36.5 C)   Ht 6' (1.829 m)   Wt 207 lb (93.9 kg)   BMI 28.07 kg/m     Physical Exam Vitals reviewed.  Constitutional:      Appearance: Normal appearance.  Neurological:     Mental Status: He is alert.     Lab Results:  Results for orders placed or performed in visit on 11/05/19 (from the past 24 hour(s))  Urinalysis, Routine w reflex microscopic     Status: Abnormal   Collection Time: 11/05/19  8:51 AM  Result Value Ref Range   Specific Gravity, UA 1.010 1.005  - 1.030   pH, UA 6.5 5.0 - 7.5   Color, UA Yellow Yellow   Appearance Ur Clear Clear   Leukocytes,UA Trace (A) Negative   Protein,UA Negative Negative/Trace   Glucose, UA Negative Negative   Ketones, UA Negative Negative   RBC, UA Trace (A) Negative   Bilirubin, UA Negative Negative   Urobilinogen, Ur 0.2 0.2 - 1.0 mg/dL   Nitrite, UA Negative Negative   Microscopic Examination See below:    Narrative   Performed at:  [facility] [address], [place], [place]  [id number] Lab Director: Mina Marble MT, Phone:  [phone]  Microscopic Examination     Status: Abnormal   Collection Time: 11/05/19  8:51 AM   Urine  Result Value Ref Range   WBC, UA 0-5 0 - 5 /hpf   RBC 0-2 0 - 2 /hpf   Epithelial Cells (non renal) 0-10 0 - 10 /hpf   Renal Epithel, UA None seen None seen /hpf   Bacteria, UA Few (A) None seen/Few   Narrative   Performed at:  [facility] [address] [place], [place]  [id number] Lab Director: [place], Phone:  [phone]    BMET No results for input(s): NA, K, CL, CO2, GLUCOSE, BUN, CREATININE, CALCIUM in the last 72 hours. PSA 2.8   Prior records and IPSS reviewed.  Christene Slates  Studies/Results: No results found.  Recent Results (from the past 2160 hour(s))  CBC with Differential/Platelet     Status: Abnormal   Collection Time: 09/30/19  8:34 AM  Result Value Ref Range   WBC 7.3 3.4 - 10.8 x10E3/uL   RBC 4.51 4.14 - 5.80 x10E6/uL   Hemoglobin 15.0 13.0 - 17.7 g/dL   Hematocrit 43.0 37.5 - 51.0 %   MCV 95 79 - 97 fL   MCH 33.3 (H) 26.6 - 33.0 pg   MCHC 34.9 31 - 35 g/dL   RDW 12.6 11.6 - 15.4 %   Platelets 154 150 - 450 x10E3/uL   Neutrophils 48 Not Estab. %   Lymphs 35 Not Estab. %   Monocytes 11 Not Estab. %   Eos 5 Not Estab. %   Basos 1 Not Estab. %   Neutrophils Absolute 3.5 1 - 7 x10E3/uL   Lymphocytes Absolute 2.6 0 - 3 x10E3/uL   Monocytes Absolute 0.8 0 - 0 x10E3/uL   EOS (ABSOLUTE) 0.4 0.0 -  0.4 x10E3/uL   Basophils Absolute 0.1 0 - 0 x10E3/uL   Immature Granulocytes 0 Not Estab. %   Immature Grans (Abs) 0.0 0.0 - 0.1 x10E3/uL  CMP14+EGFR     Status: Abnormal   Collection Time: 09/30/19  8:34 AM  Result Value Ref Range   Glucose 105 (H) 65 - 99 mg/dL   BUN 18 8 - 27 mg/dL   Creatinine, Ser 1.30 (H) 0.76 - 1.27 mg/dL   GFR calc non Af Amer 53 (L) >59 mL/min/1.73   GFR calc Af Amer 61 >59 mL/min/1.73    Comment: **[facility] currently reports eGFR in compliance with the current**   recommendations of the [employer]. [facility] will   update reporting as new guidelines are published from the NKF-ASN   Task force.    BUN/Creatinine Ratio 14 10 - 24   Sodium 138 134 - 144 mmol/L   Potassium 3.8 3.5 - 5.2 mmol/L   Chloride 99 96 - 106 mmol/L  CO2 25 20 - 29 mmol/L   Calcium 9.8 8.6 - 10.2 mg/dL   Total Protein 6.7 6.0 - 8.5 g/dL   Albumin 4.5 3.7 - 4.7 g/dL   Globulin, Total 2.2 1.5 - 4.5 g/dL   Albumin/Globulin Ratio 2.0 1.2 - 2.2   Bilirubin Total 0.9 0.0 - 1.2 mg/dL   Alkaline Phosphatase 37 (L) 48 - 121 IU/L   AST 22 0 - 40 IU/L   ALT 18 0 - 44 IU/L  Lipid panel     Status: None   Collection Time: 09/30/19  8:34 AM  Result Value Ref Range   Cholesterol, Total 144 100 - 199 mg/dL   Triglycerides 105 0 - 149 mg/dL   HDL 46 >39 mg/dL   VLDL Cholesterol Cal 19 5 - 40 mg/dL   LDL Chol Calc (NIH) 79 0 - 99 mg/dL   Chol/HDL Ratio 3.1 0.0 - 5.0 ratio    Comment:                                   T. Chol/HDL Ratio                                             Men  Women                               1/2 Avg.Risk  3.4    3.3                                   Avg.Risk  5.0    4.4                                2X Avg.Risk  9.6    7.1                                3X Avg.Risk 23.4   11.0   PSA, total and free     Status: None   Collection Time: 09/30/19  8:34 AM  Result Value Ref Range   Prostate Specific Ag, Serum 2.8 0.0 - 4.0 ng/mL    Comment: Roche  ECLIA methodology. According to the American Urological Association, Serum PSA should decrease and remain at undetectable levels after radical prostatectomy. The AUA defines biochemical recurrence as an initial PSA value 0.2 ng/mL or greater followed by a subsequent confirmatory PSA value 0.2 ng/mL or greater. Values obtained with different assay methods or kits cannot be used interchangeably. Results cannot be interpreted as absolute evidence of the presence or absence of malignant disease.    PSA, Free 0.73 N/A ng/mL    Comment: Roche ECLIA methodology.   PSA, Free Pct 26.1 %    Comment: The table below lists the probability of prostate cancer for men with non-suspicious DRE results and total PSA between 4 and 10 ng/mL, by patient age Ricci Barker, [place], [id number]).                   % Free PSA  50-64 yr        13-75 yr                   0.00-10.00%        56%             55%                  10.01-15.00%        24%             35%                  15.01-20.00%        17%             23%                  20.01-25.00%        10%             20%                       >25.00%         5%              9% Please note:  Catalona et al did not make specific               recommendations regarding the use of               percent free PSA for any other population               of men.   Urinalysis, Routine w reflex microscopic     Status: Abnormal   Collection Time: 11/05/19  8:51 AM  Result Value Ref Range   Specific Gravity, UA 1.010 1.005 - 1.030   pH, UA 6.5 5.0 - 7.5   Color, UA Yellow Yellow   Appearance Ur Clear Clear   Leukocytes,UA Trace (A) Negative   Protein,UA Negative Negative/Trace   Glucose, UA Negative Negative   Ketones, UA Negative Negative   RBC, UA Trace (A) Negative   Bilirubin, UA Negative Negative   Urobilinogen, Ur 0.2 0.2 - 1.0 mg/dL   Nitrite, UA Negative Negative   Microscopic Examination See below:   Microscopic Examination     Status:  Abnormal   Collection Time: 11/05/19  8:51 AM   Urine  Result Value Ref Range   WBC, UA 0-5 0 - 5 /hpf   RBC 0-2 0 - 2 /hpf   Epithelial Cells (non renal) 0-10 0 - 10 /hpf   Renal Epithel, UA None seen None seen /hpf   Bacteria, UA Few (A) None seen/Few     Assessment & Plan: Prostate cancer on surveillance.   PSA is down further with finasteride.   He will continue that medication.    BPH with BOO and frequency.  He is doing well on tamsulosin and finasteride which were refilled.     Meds ordered this encounter  Medications  . finasteride (PROSCAR) 5 MG tablet    Sig: Take 1 tablet (5 mg total) by mouth daily.    Dispense:  90 tablet    Refill:  3  . tamsulosin (FLOMAX) 0.4 MG CAPS capsule    Sig: Take 1 capsule (0.4 mg total) by mouth daily after supper.    Dispense:  90 capsule    Refill:  3     Orders Placed This Encounter  Procedures  .  Microscopic Examination  . Urinalysis, Routine w reflex microscopic  . PSA    Standing Status:   Future    Standing Expiration Date:   11/04/2020      Return in about 6 months (around 05/04/2020) for with PSA.   CC: Chevis Pretty, FNP      Irine Seal 11/05/2019

## 2019-11-05 ENCOUNTER — Other Ambulatory Visit: Payer: Self-pay

## 2019-11-05 ENCOUNTER — Ambulatory Visit (INDEPENDENT_AMBULATORY_CARE_PROVIDER_SITE_OTHER): Payer: Medicare Other | Admitting: Urology

## 2019-11-05 VITALS — BP 118/82 | HR 83 | Temp 97.7°F | Ht 72.0 in | Wt 207.0 lb

## 2019-11-05 DIAGNOSIS — N401 Enlarged prostate with lower urinary tract symptoms: Secondary | ICD-10-CM | POA: Diagnosis not present

## 2019-11-05 DIAGNOSIS — N138 Other obstructive and reflux uropathy: Secondary | ICD-10-CM | POA: Diagnosis not present

## 2019-11-05 DIAGNOSIS — R351 Nocturia: Secondary | ICD-10-CM | POA: Diagnosis not present

## 2019-11-05 DIAGNOSIS — C61 Malignant neoplasm of prostate: Secondary | ICD-10-CM

## 2019-11-05 LAB — URINALYSIS, ROUTINE W REFLEX MICROSCOPIC
Bilirubin, UA: NEGATIVE
Glucose, UA: NEGATIVE
Ketones, UA: NEGATIVE
Nitrite, UA: NEGATIVE
Protein,UA: NEGATIVE
Specific Gravity, UA: 1.01 (ref 1.005–1.030)
Urobilinogen, Ur: 0.2 mg/dL (ref 0.2–1.0)
pH, UA: 6.5 (ref 5.0–7.5)

## 2019-11-05 LAB — MICROSCOPIC EXAMINATION: Renal Epithel, UA: NONE SEEN /hpf

## 2019-11-05 MED ORDER — TAMSULOSIN HCL 0.4 MG PO CAPS: 0.4000 mg | ORAL_CAPSULE | Freq: Every day | ORAL | 3 refills | Status: DC

## 2019-11-05 MED ORDER — FINASTERIDE 5 MG PO TABS: 5.0000 mg | ORAL_TABLET | Freq: Every day | ORAL | 3 refills | Status: DC

## 2019-11-05 NOTE — Progress Notes (Signed)

## 2019-11-30 DIAGNOSIS — Z029 Encounter for administrative examinations, unspecified: Secondary | ICD-10-CM

## 2020-01-31 ENCOUNTER — Ambulatory Visit (INDEPENDENT_AMBULATORY_CARE_PROVIDER_SITE_OTHER): Payer: Medicare Other | Admitting: Family Medicine

## 2020-01-31 ENCOUNTER — Other Ambulatory Visit: Payer: Self-pay

## 2020-01-31 ENCOUNTER — Encounter: Payer: Self-pay | Admitting: Family Medicine

## 2020-01-31 DIAGNOSIS — L739 Follicular disorder, unspecified: Secondary | ICD-10-CM | POA: Diagnosis not present

## 2020-01-31 MED ORDER — AMOXICILLIN 500 MG PO TABS: 2000.0000 mg | ORAL_TABLET | Freq: Once | ORAL | 3 refills | Status: DC | PRN

## 2020-01-31 MED ORDER — METHYLPREDNISOLONE 4 MG PO TBPK: ORAL_TABLET | ORAL | 0 refills | Status: DC

## 2020-01-31 NOTE — Progress Notes (Signed)
Subjective:    Patient ID: Andre Estrada, male    DOB: 19-Feb-1940, 80 y.o.   MRN: [id number]   HPI: Andre Estrada is a 79 y.o. male presenting for red spots on belly and both arms. Onset 1-2 weeks. Itch a little bit. Size is 1-2 mm each. Located here and yonder. Denies fever. Feels good except for a little itch. Trying to keep it moist with damp rags.    Depression screen [facility] 2/9 09/30/2019 04/14/2019 04/01/2019 09/22/2018 03/24/2018  Decreased Interest 0 0 0 0 3  Down, Depressed, Hopeless 0 0 0 0 3  PHQ - 2 Score 0 0 0 0 6  Altered sleeping - - - - 0  Tired, decreased energy - - - - 0  Change in appetite - - - - 0  Feeling bad or failure about yourself  - - - - 0  Trouble concentrating - - - - 0  Moving slowly or fidgety/restless - - - - 0  Suicidal thoughts - - - - 0  PHQ-9 Score - - - - 6     Relevant past medical, surgical, family and social history reviewed and updated as indicated.  Interim medical history since our last visit reviewed. Allergies and medications reviewed and updated.  ROS:  Review of Systems  Constitutional: Negative for chills, fatigue and fever.  HENT: Negative for congestion and sore throat.   Respiratory: Negative for cough, shortness of breath and wheezing.   Cardiovascular: Negative for chest pain.  Musculoskeletal: Negative for myalgias.  Skin: Negative for color change and rash.     Social History   Tobacco Use  Smoking Status Current Every Day Smoker   Packs/day: 1.00   Years: 60.00   Pack years: 60.00   Types: Cigarettes  Smokeless Tobacco Never Used       Objective:     Wt Readings from Last 3 Encounters:  11/05/19 207 lb (93.9 kg)  09/30/19 207 lb (93.9 kg)  04/30/19 215 lb (97.5 kg)     Exam deferred. Pt. [place] due to COVID 19. Phone visit performed.   Assessment & Plan:   1. Folliculitis     Meds ordered this encounter  Medications   methylPREDNISolone (MEDROL DOSEPAK) 4 MG TBPK tablet    Sig: Start  with 6 on the first day and take one less each day until finished    Dispense:  21 tablet    Refill:  0   amoxicillin (AMOXIL) 500 MG tablet    Sig: Take 4 tablets (2,000 mg total) by mouth once as needed for up to 1 dose (one hour before dental procedure).    Dispense:  4 tablet    Refill:  3    No orders of the defined types were placed in this encounter.     Diagnoses and all orders for this visit:  Folliculitis  Other orders -     methylPREDNISolone (MEDROL DOSEPAK) 4 MG TBPK tablet; Start with 6 on the first day and take one less each day until finished -     amoxicillin (AMOXIL) 500 MG tablet; Take 4 tablets (2,000 mg total) by mouth once as needed for up to 1 dose (one hour before dental procedure).    Virtual Visit via telephone Note  I discussed the limitations, risks, security and privacy concerns of performing an evaluation and management service by telephone and the availability of in person appointments. The patient was identified with two identifiers. Pt.expressed understanding and agreed to  proceed. Pt. Is at home. Dr. Livia Snellen is in his office.  Follow Up Instructions:   I discussed the assessment and treatment plan with the patient. The patient was provided an opportunity to ask questions and all were answered. The patient agreed with the plan and demonstrated an understanding of the instructions.   The patient was advised to call back or seek an in-person evaluation if the symptoms worsen or if the condition fails to improve as anticipated.   Total minutes including chart review and phone contact time: 12   Follow up plan: Return if symptoms worsen or fail to improve.  Claretta Fraise, MD [place]

## 2020-02-22 IMAGING — DX DG CHEST 2V
3 series · 3 of 3 positions shown · non-contrast
Comparison: February 14, 2014.

CLINICAL DATA: Smoker.

EXAM:
CHEST - 2 VIEW

[chest pa (1 of 2)]
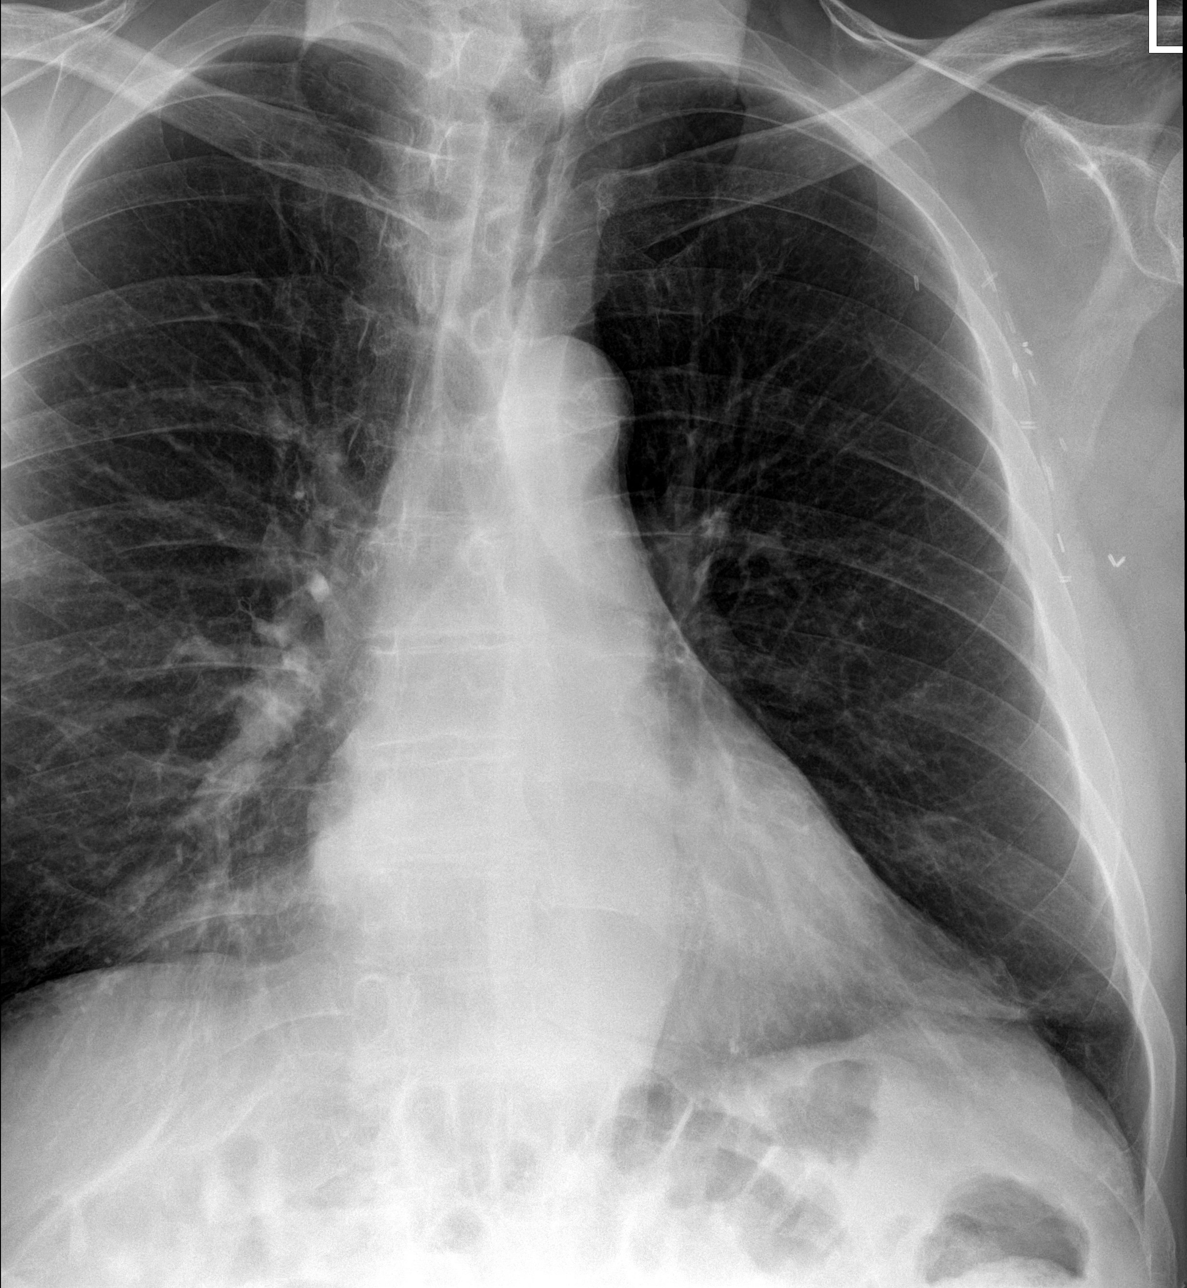

[chest lat]
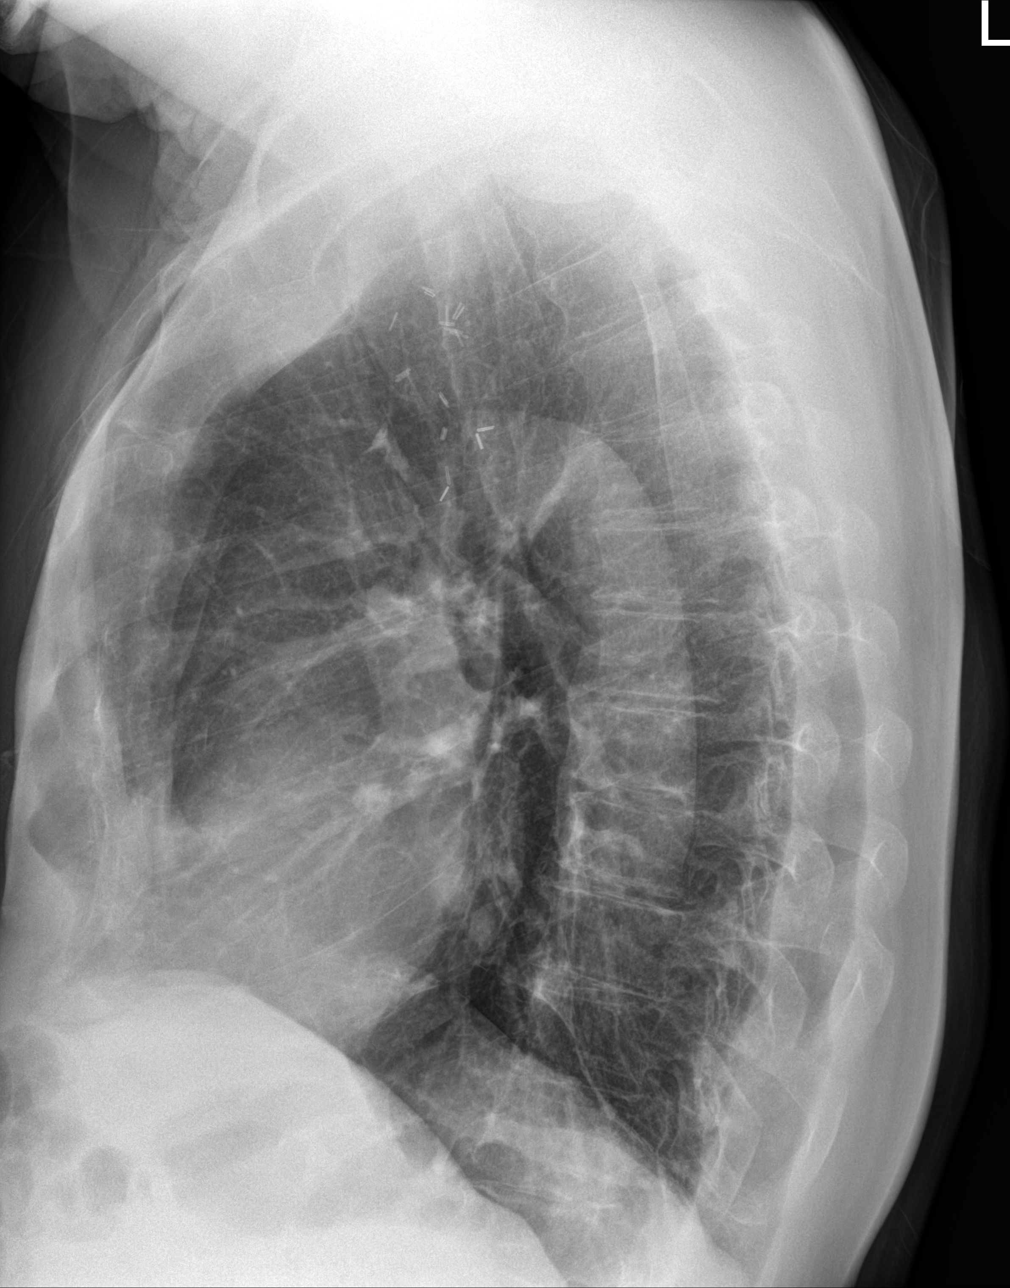

[chest pa (2 of 2)]
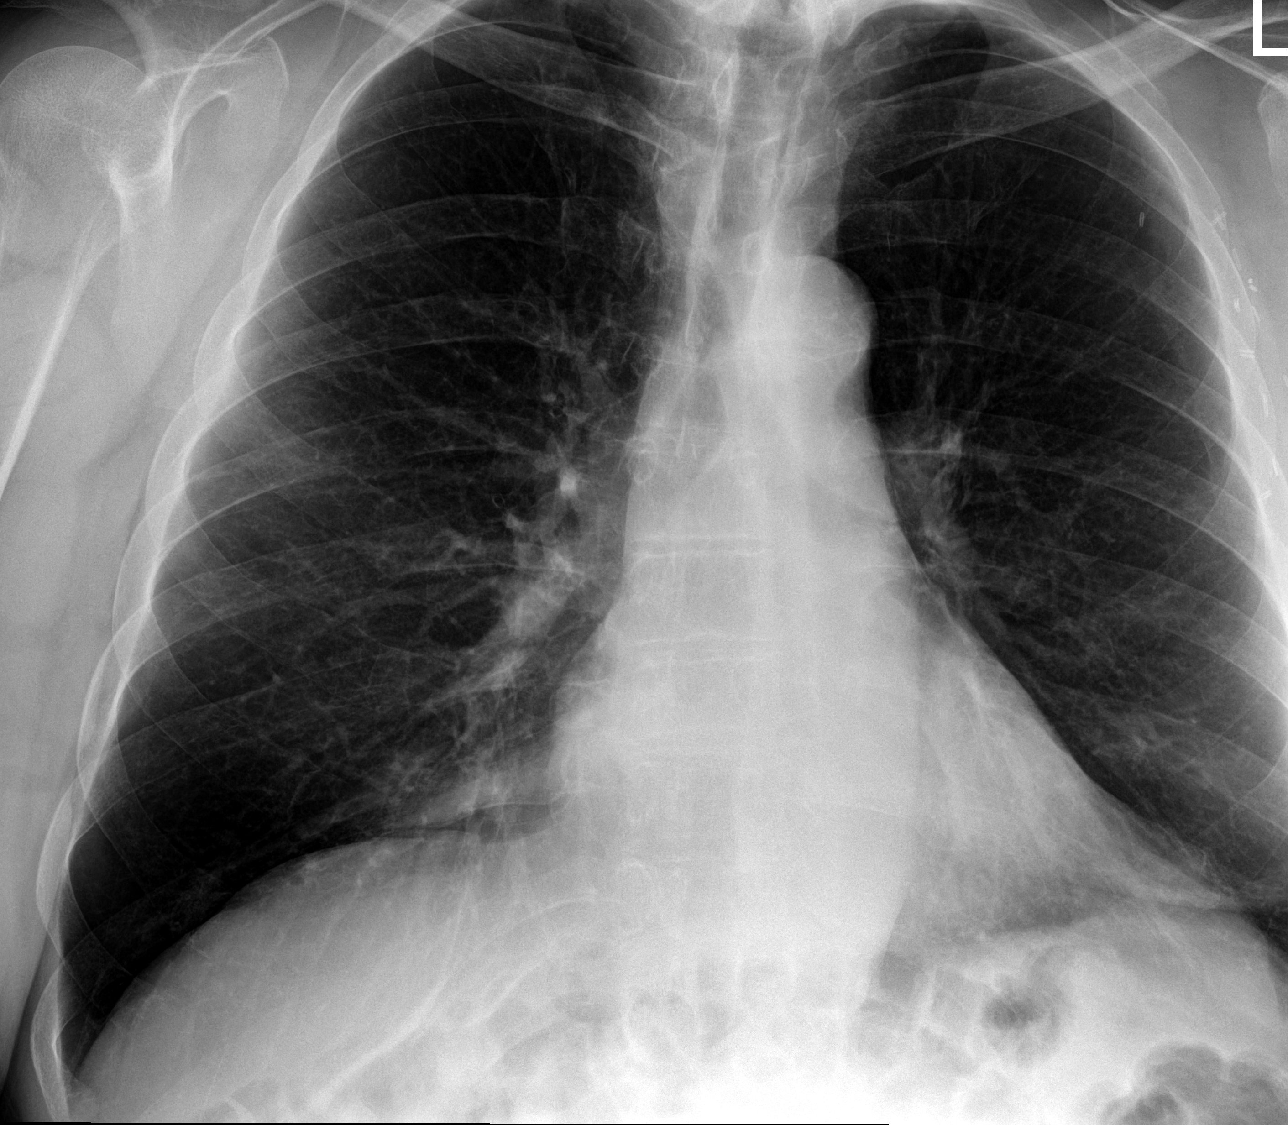

[3 of 3 positions shown; findings below may reference images not displayed]

FINDINGS: The heart size and mediastinal contours are within normal limits.
Both lungs are clear. The visualized skeletal structures are
unremarkable.
IMPRESSION: No active cardiopulmonary disease.

## 2020-02-23 ENCOUNTER — Ambulatory Visit (INDEPENDENT_AMBULATORY_CARE_PROVIDER_SITE_OTHER): Payer: Medicare Other | Admitting: Nurse Practitioner

## 2020-02-23 ENCOUNTER — Encounter: Payer: Self-pay | Admitting: Nurse Practitioner

## 2020-02-23 ENCOUNTER — Other Ambulatory Visit: Payer: Self-pay

## 2020-02-23 VITALS — BP 136/83 | HR 102 | Temp 96.8°F | Resp 20 | Ht 72.0 in | Wt 210.0 lb

## 2020-02-23 DIAGNOSIS — L235 Allergic contact dermatitis due to other chemical products: Secondary | ICD-10-CM | POA: Insufficient documentation

## 2020-02-23 MED ORDER — METHYLPREDNISOLONE ACETATE 40 MG/ML IJ SUSP
40.0000 mg | Freq: Once | INTRAMUSCULAR | Status: AC
  Administered 2020-02-23: 40 mg via INTRAMUSCULAR

## 2020-02-23 MED ORDER — PREDNISONE 10 MG (21) PO TBPK: ORAL_TABLET | ORAL | 0 refills | Status: DC

## 2020-02-23 MED ORDER — HYDROCORTISONE 1 % EX LOTN: 1.0000 "application " | TOPICAL_LOTION | Freq: Two times a day (BID) | CUTANEOUS | 1 refills | Status: AC

## 2020-02-23 NOTE — Patient Instructions (Signed)
Depo-Medrol 40 shot given in office, prednisone taper, antiitch cream, cold compress, Follow-up with worsening or unresolved symptoms.  Contact Dermatitis Dermatitis is redness, soreness, and swelling (inflammation) of the skin. Contact dermatitis is a reaction to something that touches the skin. There are two types of contact dermatitis:  Irritant contact dermatitis. This happens when something bothers (irritates) your skin, like soap.  Allergic contact dermatitis. This is caused when you are exposed to something that you are allergic to, such as poison ivy. What are the causes?  Common causes of irritant contact dermatitis include: ? Makeup. ? Soaps. ? Detergents. ? Bleaches. ? Acids. ? Metals, such as nickel.  Common causes of allergic contact dermatitis include: ? Plants. ? Chemicals. ? Jewelry. ? Latex. ? Medicines. ? Preservatives in products, such as clothing. What increases the risk?  Having a job that exposes you to things that bother your skin.  Having asthma or eczema. What are the signs or symptoms? Symptoms may happen anywhere the irritant has touched your skin. Symptoms include:  Dry or flaky skin.  Redness.  Cracks.  Itching.  Pain or a burning feeling.  Blisters.  Blood or clear fluid draining from skin cracks. With allergic contact dermatitis, swelling may occur. This may happen in places such as the eyelids, mouth, or genitals. How is this treated?  This condition is treated by checking for the cause of the reaction and protecting your skin. Treatment may also include: ? Steroid creams, ointments, or medicines. ? Antibiotic medicines or other ointments, if you have a skin infection. ? Lotion or medicines to help with itching. ? A bandage (dressing). Follow these instructions at home: Skin care  Moisturize your skin as needed.  Put cool cloths on your skin.  Put a baking soda paste on your skin. Stir water into baking soda until it looks  like a paste.  Do not scratch your skin.  Avoid having things rub up against your skin.  Avoid the use of soaps, perfumes, and dyes. Medicines  Take or apply over-the-counter and prescription medicines only as told by your doctor.  If you were prescribed an antibiotic medicine, take or apply it as told by your doctor. Do not stop using it even if your condition starts to get better. Bathing  Take a bath with: ? Epsom salts. ? Baking soda. ? Colloidal oatmeal.  Bathe less often.  Bathe in warm water. Avoid using hot water. Bandage care  If you were given a bandage, change it as told by your health care provider.  Wash your hands with soap and water before and after you change your bandage. If soap and water are not available, use hand sanitizer. General instructions  Avoid the things that caused your reaction. If you do not know what caused it, keep a journal. Write down: ? What you eat. ? What skin products you use. ? What you drink. ? What you wear in the area that has symptoms. This includes jewelry.  Check the affected areas every day for signs of infection. Check for: ? More redness, swelling, or pain. ? More fluid or blood. ? Warmth. ? Pus or a bad smell.  Keep all follow-up visits as told by your doctor. This is important. Contact a doctor if:  You do not get better with treatment.  Your condition gets worse.  You have signs of infection, such as: ? More swelling. ? Tenderness. ? More redness. ? Soreness. ? Warmth.  You have a fever.  You have  new symptoms. Get help right away if:  You have a very bad headache.  You have neck pain.  Your neck is stiff.  You throw up (vomit).  You feel very sleepy.  You see red streaks coming from the area.  Your bone or joint near the area hurts after the skin has healed.  The area turns darker.  You have trouble breathing. Summary  Dermatitis is redness, soreness, and swelling of the  skin.  Symptoms may occur where the irritant has touched you.  Treatment may include medicines and skin care.  If you do not know what caused your reaction, keep a journal.  Contact a doctor if your condition gets worse or you have signs of infection. This information is not intended to replace advice given to you by your health care provider. Make sure you discuss any questions you have with your health care provider. Document Revised: 05/27/2018 Document Reviewed: 08/20/2017 Elsevier Patient Education  2020 [employer].

## 2020-02-23 NOTE — Progress Notes (Signed)
Acute Office Visit  Subjective:    Patient ID: Andre Estrada, male    DOB: March 04, 1940, 80 y.o.   MRN: [id number]  Chief Complaint  Patient presents with  . Rash    Rash This is a new problem. The current episode started in the past 7 days. The problem is unchanged. The affected locations include the back, right hand, right arm, right lower leg, left lower leg, left wrist, left hand and left arm. The rash is characterized by dryness, redness and itchiness. He was exposed to chemicals. Pertinent negatives include no congestion, cough, fever, shortness of breath or vomiting. Past treatments include nothing. There is no history of varicella.     Past Medical History:  Diagnosis Date  . Fracture of thumb    left  . Hyperlipidemia   . Hypertension   . Melanoma (HCC)    BACK AND STOMACH   . Prostate enlargement   . Sinusitis   . Tobacco dependence     Past Surgical History:  Procedure Laterality Date  . LYMPH NODE BIOPSY    . MELANOMA EXCISION    . PARTIAL COLECTOMY    . SPLENECTOMY, TOTAL      Family History  Problem Relation Age of Onset  . Coronary artery disease Father   . COPD Father   . Prostatitis Brother   . Diabetes Son   . Prostatitis Brother   . Alcohol abuse Brother     Social History   Socioeconomic History  . Marital status: Widowed    Spouse name: Not on file  . Number of children: 1  . Years of education: 39  . Highest education level: 9th grade  Occupational History  . Occupation: retired  Tobacco Use  . Smoking status: Current Every Day Smoker    Packs/day: 1.00    Years: 60.00    Pack years: 60.00    Types: Cigarettes  . Smokeless tobacco: Never Used  Vaping Use  . Vaping Use: Never used  Substance and Sexual Activity  . Alcohol use: No  . Drug use: No  . Sexual activity: Not Currently  Other Topics Concern  . Not on file  Social History Narrative  . Not on file   Social Determinants of Health   Financial Resource Strain: Low  Risk   . Difficulty of Paying Living Expenses: Not hard at all  Food Insecurity: No Food Insecurity  . Worried About Programme researcher, broadcasting/film/video in the Last Year: Never true  . Ran Out of Food in the Last Year: Never true  Transportation Needs: No Transportation Needs  . Lack of Transportation (Medical): No  . Lack of Transportation (Non-Medical): No  Physical Activity: Sufficiently Active  . Days of Exercise per Week: 7 days  . Minutes of Exercise per Session: 30 min  Stress: No Stress Concern Present  . Feeling of Stress : Not at all  Social Connections: Socially Isolated  . Frequency of Communication with Friends and Family: More than three times a week  . Frequency of Social Gatherings with Friends and Family: More than three times a week  . Attends Religious Services: Never  . Active Member of Clubs or Organizations: No  . Attends Banker Meetings: Never  . Marital Status: Widowed  Intimate Partner Violence: Not At Risk  . Fear of Current or Ex-Partner: No  . Emotionally Abused: No  . Physically Abused: No  . Sexually Abused: No    Outpatient Medications Prior to Visit  Medication Sig Dispense Refill  . aspirin 81 MG tablet Take 81 mg by mouth daily.    . finasteride (PROSCAR) 5 MG tablet Take 1 tablet (5 mg total) by mouth daily. 90 tablet 3  . lisinopril-hydrochlorothiazide (ZESTORETIC) 10-12.5 MG tablet Take 1 tablet by mouth daily. 90 tablet 1  . simvastatin (ZOCOR) 40 MG tablet Take 1 tablet (40 mg total) by mouth at bedtime. 90 tablet 1  . tamsulosin (FLOMAX) 0.4 MG CAPS capsule Take 1 capsule (0.4 mg total) by mouth daily after supper. 90 capsule 3  . amoxicillin (AMOXIL) 500 MG tablet Take 4 tablets (2,000 mg total) by mouth once as needed for up to 1 dose (one hour before dental procedure). (Patient not taking: Reported on 02/23/2020) 4 tablet 3  . methylPREDNISolone (MEDROL DOSEPAK) 4 MG TBPK tablet Start with 6 on the first day and take one less each day until  finished 21 tablet 0   No facility-administered medications prior to visit.    Allergies  Allergen Reactions  . Sulfa Antibiotics     Review of Systems  Constitutional: Negative for fever.  HENT: Negative for congestion.   Respiratory: Negative for cough and shortness of breath.   Gastrointestinal: Negative for vomiting.  Skin: Positive for color change and rash.  All other systems reviewed and are negative.      Objective:    Physical Exam Vitals reviewed.  Constitutional:      General: He is awake.     Interventions: Face mask in place.    HENT:     Head: Normocephalic.     Nose: Nose normal.     Mouth/Throat:     Mouth: Mucous membranes are moist.     Pharynx: Oropharynx is clear.  Eyes:     Conjunctiva/sclera: Conjunctivae normal.  Cardiovascular:     Rate and Rhythm: Normal rate and regular rhythm.     Pulses: Normal pulses.     Heart sounds: Normal heart sounds.  Pulmonary:     Effort: Pulmonary effort is normal.     Breath sounds: Normal breath sounds.  Abdominal:     General: Bowel sounds are normal.  Musculoskeletal:        General: Normal range of motion.  Skin:    General: Skin is warm.     Findings: Erythema and rash present.  Neurological:     Mental Status: He is alert and oriented to person, place, and time.  Psychiatric:        Mood and Affect: Mood normal.        Behavior: Behavior normal. Behavior is cooperative.     BP 136/83   Pulse (!) 102   Temp (!) 96.8 F (36 C) (Temporal)   Resp 20   Ht 6' (1.829 m)   Wt 210 lb (95.3 kg)   SpO2 96%   BMI 28.48 kg/m  Wt Readings from Last 3 Encounters:  02/23/20 210 lb (95.3 kg)  11/05/19 207 lb (93.9 kg)  09/30/19 207 lb (93.9 kg)    Health Maintenance Due  Topic Date Due  . Hepatitis C Screening  Never done  . COVID-19 Vaccine (3 - Moderna risk 4-dose series) 05/26/2019    There are no preventive care reminders to display for this patient.   No results found for: TSH Lab  Results  Component Value Date   WBC 7.3 09/30/2019   HGB 15.0 09/30/2019   HCT 43.0 09/30/2019   MCV 95 09/30/2019   PLT 154 09/30/2019  Lab Results  Component Value Date   NA 138 09/30/2019   K 3.8 09/30/2019   CO2 25 09/30/2019   GLUCOSE 105 (H) 09/30/2019   BUN 18 09/30/2019   CREATININE 1.30 (H) 09/30/2019   BILITOT 0.9 09/30/2019   ALKPHOS 37 (L) 09/30/2019   AST 22 09/30/2019   ALT 18 09/30/2019   PROT 6.7 09/30/2019   ALBUMIN 4.5 09/30/2019   CALCIUM 9.8 09/30/2019   ANIONGAP 9 06/03/2017   Lab Results  Component Value Date   CHOL 144 09/30/2019   Lab Results  Component Value Date   HDL 46 09/30/2019   Lab Results  Component Value Date   LDLCALC 79 09/30/2019   Lab Results  Component Value Date   TRIG 105 09/30/2019   Lab Results  Component Value Date   CHOLHDL 3.1 09/30/2019   Lab Results  Component Value Date   HGBA1C 5.3 01/20/2013       Assessment & Plan:   Problem List Items Addressed This Visit      Musculoskeletal and Integument   Allergic contact dermatitis due to chemical - Primary    Patient is a 80 year old male who presents to clinic with allergic dermatitis.  Patient reports using a spray for disinfectant and broke out in a rash all over his arms and legs and back after 24 hours.  Patient is reporting itchy skin, dryness and erythema.  Patient denies fever, cough, shortness of breath or wheezing.  Provided education to patient with printed handouts given  Depo-Medrol 40 given in office.  Prednisone taper, hydrocortisone cream.  Follow-up with worsening or unresolved symptoms.      Relevant Medications   predniSONE (STERAPRED UNI-PAK 21 TAB) 10 MG (21) TBPK tablet   hydrocortisone 1 % lotion       Meds ordered this encounter  Medications  . predniSONE (STERAPRED UNI-PAK 21 TAB) 10 MG (21) TBPK tablet    Sig: 6 tablet day 1, 5 tablet day 2, 4 tablet day 3, 3 tablet 4, 2 tablets day 5, 1 tablet day 6    Dispense:  1 each     Refill:  0    Order Specific Question:   Supervising Provider    Answer:   Janora Norlander [id number]  . hydrocortisone 1 % lotion    Sig: Apply 1 application topically 2 (two) times daily.    Dispense:  118 mL    Refill:  1    Order Specific Question:   Supervising Provider    Answer:   Janora Norlander [id number]     Ivy Lynn, NP

## 2020-02-23 NOTE — Addendum Note (Signed)
Addended by: Quay Burow on: 02/23/2020 08:48 AM   Modules accepted: Orders

## 2020-02-23 NOTE — Assessment & Plan Note (Signed)
Patient is a 80 year old male who presents to clinic with allergic dermatitis.  Patient reports using a spray for disinfectant and broke out in a rash all over his arms and legs and back after 24 hours.  Patient is reporting itchy skin, dryness and erythema.  Patient denies fever, cough, shortness of breath or wheezing.  Provided education to patient with printed handouts given  Depo-Medrol 40 given in office.  Prednisone taper, hydrocortisone cream.  Follow-up with worsening or unresolved symptoms.

## 2020-04-03 ENCOUNTER — Other Ambulatory Visit: Payer: Self-pay

## 2020-04-03 ENCOUNTER — Ambulatory Visit (INDEPENDENT_AMBULATORY_CARE_PROVIDER_SITE_OTHER): Payer: Medicare Other | Admitting: Nurse Practitioner

## 2020-04-03 ENCOUNTER — Encounter: Payer: Self-pay | Admitting: Nurse Practitioner

## 2020-04-03 VITALS — BP 124/84 | HR 82 | Temp 98.0°F | Resp 20 | Ht 72.0 in | Wt 214.0 lb

## 2020-04-03 DIAGNOSIS — I10 Essential (primary) hypertension: Secondary | ICD-10-CM

## 2020-04-03 DIAGNOSIS — E782 Mixed hyperlipidemia: Secondary | ICD-10-CM | POA: Diagnosis not present

## 2020-04-03 DIAGNOSIS — N400 Enlarged prostate without lower urinary tract symptoms: Secondary | ICD-10-CM | POA: Diagnosis not present

## 2020-04-03 DIAGNOSIS — Z6828 Body mass index (BMI) 28.0-28.9, adult: Secondary | ICD-10-CM

## 2020-04-03 DIAGNOSIS — K432 Incisional hernia without obstruction or gangrene: Secondary | ICD-10-CM

## 2020-04-03 MED ORDER — LISINOPRIL-HYDROCHLOROTHIAZIDE 10-12.5 MG PO TABS
1.0000 | ORAL_TABLET | Freq: Every day | ORAL | 1 refills | Status: DC
Start: 2020-04-03 — End: 2020-10-02

## 2020-04-03 MED ORDER — TAMSULOSIN HCL 0.4 MG PO CAPS: 0.4000 mg | ORAL_CAPSULE | Freq: Every day | ORAL | 1 refills | Status: DC

## 2020-04-03 MED ORDER — FINASTERIDE 5 MG PO TABS: 5.0000 mg | ORAL_TABLET | Freq: Every day | ORAL | 1 refills | Status: DC

## 2020-04-03 MED ORDER — SIMVASTATIN 40 MG PO TABS: 40.0000 mg | ORAL_TABLET | Freq: Every day | ORAL | 1 refills | Status: DC

## 2020-04-03 NOTE — Patient Instructions (Signed)

## 2020-04-03 NOTE — Progress Notes (Signed)
Subjective:    Patient ID: Andre Estrada, male    DOB: 1940-06-11, 80 y.o.   MRN: [id number]   Chief Complaint: Medical Management of Chronic Issues    HPI:  1. Primary hypertension No c/o chest pain, sob or headache. Does not check blood pressure at home. BP Readings from Last 3 Encounters:  02/23/20 136/83  11/05/19 118/82  09/30/19 117/79     2. Mixed hyperlipidemia Doe snot really wtahc diet. Says he just eats whatever. Lab Results  Component Value Date   CHOL 144 09/30/2019   HDL 46 09/30/2019   LDLCALC 79 09/30/2019   TRIG 105 09/30/2019   CHOLHDL 3.1 09/30/2019     3. Benign prostatic hyperplasia without lower urinary tract symptoms He denies any voiding problems. He is on flomax and proscar dialy and is doing well.  4. Incisional hernia, without obstruction or gangrene Has a very large incisional hernia which has never given him problems.  5. BMI 28.0-28.9,adult No recent weight changes. Wt Readings from Last 3 Encounters:  02/23/20 210 lb (95.3 kg)  11/05/19 207 lb (93.9 kg)  09/30/19 207 lb (93.9 kg)   BMI Readings from Last 3 Encounters:  02/23/20 28.48 kg/m  11/05/19 28.07 kg/m  09/30/19 28.07 kg/m       Outpatient Encounter Medications as of 04/03/2020  Medication Sig  . amoxicillin (AMOXIL) 500 MG tablet Take 4 tablets (2,000 mg total) by mouth once as needed for up to 1 dose (one hour before dental procedure). (Patient not taking: Reported on 02/23/2020)  . aspirin 81 MG tablet Take 81 mg by mouth daily.  . finasteride (PROSCAR) 5 MG tablet Take 1 tablet (5 mg total) by mouth daily.  . hydrocortisone 1 % lotion Apply 1 application topically 2 (two) times daily.  Marland Kitchen lisinopril-hydrochlorothiazide (ZESTORETIC) 10-12.5 MG tablet Take 1 tablet by mouth daily.  . predniSONE (STERAPRED UNI-PAK 21 TAB) 10 MG (21) TBPK tablet 6 tablet day 1, 5 tablet day 2, 4 tablet day 3, 3 tablet 4, 2 tablets day 5, 1 tablet day 6  . simvastatin (ZOCOR) 40 MG  tablet Take 1 tablet (40 mg total) by mouth at bedtime.  . tamsulosin (FLOMAX) 0.4 MG CAPS capsule Take 1 capsule (0.4 mg total) by mouth daily after supper.     Past Surgical History:  Procedure Laterality Date  . LYMPH NODE BIOPSY    . MELANOMA EXCISION    . PARTIAL COLECTOMY    . SPLENECTOMY, TOTAL      Family History  Problem Relation Age of Onset  . Coronary artery disease Father   . COPD Father   . Prostatitis Brother   . Diabetes Son   . Prostatitis Brother   . Alcohol abuse Brother     New complaints: Non today  Social history: Lives by hisself   Controlled substance contract: n/a    Review of Systems  Constitutional: Negative for diaphoresis.  Eyes: Negative for pain.  Respiratory: Negative for shortness of breath.   Cardiovascular: Negative for chest pain, palpitations and leg swelling.  Gastrointestinal: Negative for abdominal pain.  Endocrine: Negative for polydipsia.  Skin: Negative for rash.  Neurological: Negative for dizziness, weakness and headaches.  Hematological: Does not bruise/bleed easily.  All other systems reviewed and are negative.      Objective:   Physical Exam Vitals and nursing note reviewed.  Constitutional:      Appearance: Normal appearance. He is well-developed and well-nourished.  HENT:     Head:  Normocephalic.     Nose: Nose normal.     Mouth/Throat:     Mouth: Oropharynx is clear and moist.  Eyes:     Extraocular Movements: EOM normal.     Pupils: Pupils are equal, round, and reactive to light.  Neck:     Thyroid: No thyroid mass or thyromegaly.     Vascular: No carotid bruit or JVD.     Trachea: Phonation normal.  Cardiovascular:     Rate and Rhythm: Normal rate and regular rhythm.  Pulmonary:     Effort: Pulmonary effort is normal. No respiratory distress.     Breath sounds: Normal breath sounds.  Abdominal:     General: Bowel sounds are normal. Aorta is normal.     Palpations: Abdomen is soft.      Tenderness: There is no abdominal tenderness.     Hernia: A hernia (large soft, nontender incisional hernia.) is present.  Musculoskeletal:        General: Normal range of motion.     Cervical back: Normal range of motion and neck supple.  Lymphadenopathy:     Cervical: No cervical adenopathy.  Skin:    General: Skin is warm and dry.     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  Neurological:     Mental Status: He is alert and oriented to person, place, and time.  Psychiatric:        Mood and Affect: Mood and affect normal.        Behavior: Behavior normal.        Thought Content: Thought content normal.        Judgment: Judgment normal.     BP 124/84   Pulse 82   Temp 98 F (36.7 C) (Temporal)   Resp 20   Ht 6' (1.829 m)   Wt 214 lb (97.1 kg)   SpO2 97%   BMI 29.02 kg/m        Assessment & Plan:  [facility] comes in today with chief complaint of Medical Management of Chronic Issues   Diagnosis and orders addressed:  1. Primary hypertension Low sodiyum diet - lisinopril-hydrochlorothiazide (ZESTORETIC) 10-12.5 MG tablet; Take 1 tablet by mouth daily.  Dispense: 90 tablet; Refill: 1  2. Mixed hyperlipidemia Low fat diet - simvastatin (ZOCOR) 40 MG tablet; Take 1 tablet (40 mg total) by mouth at bedtime.  Dispense: 90 tablet; Refill: 1  3. Benign prostatic hyperplasia without lower urinary tract symptoms Report any voiding problems - finasteride (PROSCAR) 5 MG tablet; Take 1 tablet (5 mg total) by mouth daily.  Dispense: 90 tablet; Refill: 1 - tamsulosin (FLOMAX) 0.4 MG CAPS capsule; Take 1 capsule (0.4 mg total) by mouth daily after supper.  Dispense: 90 capsule; Refill: 1  4. Incisional hernia, without obstruction or gangrene Report any abdominal wall pain or discomfort.  5. BMI 28.0-28.9,adult Discussed diet and exercise for person with BMI >25 Will recheck weight in 3-6 months  Orders Placed This Encounter  Procedures  . CBC with  Differential/Platelet  . CMP14+EGFR  . Lipid panel     Labs pending Health Maintenance reviewed Diet and exercise encouraged  Follow up plan: 6 months   Mary-Margaret Hassell Done, FNP

## 2020-04-04 LAB — CMP14+EGFR
ALT: 22 IU/L (ref 0–44)
AST: 23 IU/L (ref 0–40)
Albumin/Globulin Ratio: 1.9 (ref 1.2–2.2)
Albumin: 4.6 g/dL (ref 3.7–4.7)
Alkaline Phosphatase: 41 IU/L — ABNORMAL LOW (ref 44–121)
BUN/Creatinine Ratio: 13 (ref 10–24)
BUN: 15 mg/dL (ref 8–27)
Bilirubin Total: 0.6 mg/dL (ref 0.0–1.2)
CO2: 21 mmol/L (ref 20–29)
Calcium: 9.7 mg/dL (ref 8.6–10.2)
Chloride: 102 mmol/L (ref 96–106)
Creatinine, Ser: 1.16 mg/dL (ref 0.76–1.27)
GFR calc Af Amer: 69 mL/min/{1.73_m2} (ref >59)
GFR calc non Af Amer: 60 mL/min/{1.73_m2} (ref >59)
Globulin, Total: 2.4 g/dL (ref 1.5–4.5)
Glucose: 115 mg/dL — ABNORMAL HIGH (ref 65–99)
Potassium: 4.1 mmol/L (ref 3.5–5.2)
Sodium: 140 mmol/L (ref 134–144)
Total Protein: 7 g/dL (ref 6.0–8.5)

## 2020-04-04 LAB — CBC WITH DIFFERENTIAL/PLATELET
Basophils Absolute: 0.1 10*3/uL (ref 0.0–0.2)
Basos: 1 %
EOS (ABSOLUTE): 0.3 10*3/uL (ref 0.0–0.4)
Eos: 4 %
Hematocrit: 47.1 % (ref 37.5–51.0)
Hemoglobin: 16.2 g/dL (ref 13.0–17.7)
Immature Grans (Abs): 0 10*3/uL (ref 0.0–0.1)
Immature Granulocytes: 0 %
Lymphocytes Absolute: 2.6 10*3/uL (ref 0.7–3.1)
Lymphs: 29 %
MCH: 33 pg (ref 26.6–33.0)
MCHC: 34.4 g/dL (ref 31.5–35.7)
MCV: 96 fL (ref 79–97)
Monocytes Absolute: 1.1 10*3/uL — ABNORMAL HIGH (ref 0.1–0.9)
Monocytes: 13 %
Neutrophils Absolute: 4.8 10*3/uL (ref 1.4–7.0)
Neutrophils: 53 %
Platelets: 184 10*3/uL (ref 150–450)
RBC: 4.91 x10E6/uL (ref 4.14–5.80)
RDW: 11.9 % (ref 11.6–15.4)
WBC: 9 10*3/uL (ref 3.4–10.8)

## 2020-04-04 LAB — LIPID PANEL
Chol/HDL Ratio: 3 ratio (ref 0.0–5.0)
Cholesterol, Total: 149 mg/dL (ref 100–199)
HDL: 50 mg/dL (ref >39)
LDL Chol Calc (NIH): 83 mg/dL (ref 0–99)
Triglycerides: 86 mg/dL (ref 0–149)
VLDL Cholesterol Cal: 16 mg/dL (ref 5–40)

## 2020-04-27 ENCOUNTER — Other Ambulatory Visit: Payer: Medicare Other

## 2020-05-03 ENCOUNTER — Telehealth: Payer: Self-pay

## 2020-05-03 NOTE — Progress Notes (Signed)
Subjective:  1. Prostate cancer ([place])   2. BPH with urinary obstruction   3. Nocturia       He has T2a Gleason 6 prostate cancer found in a single core in the right apex with 5% involvement on biopsy in 4/16. His PSA was 2.8 with a 26% f/t ratio in 9/21 after rising to 4.1 from 3.8 but it has been variable in the past and was 4.4 in 8/19.   He remains on finasteride.  He has no associated signs or symptoms.   He had a right base nodule and a prior negative biopsy in 2014. His PSA on 7/1 was 9.4 with 25% f/t ratio and it was 8.15 prior to the biopsy but it was down to 4.6 with a 38% f/t ratio on a PSA from October 2016. Most recent PSA was 5.3 08/05/16 which was a decline from 7.7 in 12/17. He has not had a repeat biopsy     CC: I have an enlarged prostate (follow-up).  11/05/19: Andre Estrada remains on finasteride and tamsulosin with an IPSS of 1.  He has no hesitancy. He is content with his symptoms. He has had no gross hematuria.   He had retention on 05/22/17 and was started on finasteride after that.     IPSS    Row Name 05/04/20 0900         International Prostate Symptom Score   How often have you had the sensation of not emptying your bladder? Not at All     How often have you had to urinate less than every two hours? Not at All     How often have you found you stopped and started again several times when you urinated? Not at All     How often have you found it difficult to postpone urination? Not at All     How often have you had a weak urinary stream? Not at All     How often have you had to strain to start urination? Not at All     How many times did you typically get up at night to urinate? 1 Time     Total IPSS Score 1           Quality of Life due to urinary symptoms   If you were to spend the rest of your life with your urinary condition just the way it is now how would you feel about that? Mostly Satisfied             ROS:  ROS:  A complete review of systems  was performed.  All systems are negative except for pertinent findings as noted.   Review of Systems  All other systems reviewed and are negative.   Allergies  Allergen Reactions  . Sulfa Antibiotics     Outpatient Encounter Medications as of 05/04/2020  Medication Sig  . aspirin 81 MG tablet Take 81 mg by mouth daily.  . finasteride (PROSCAR) 5 MG tablet Take 1 tablet (5 mg total) by mouth daily.  . hydrocortisone 1 % lotion Apply 1 application topically 2 (two) times daily.  Marland Kitchen lisinopril-hydrochlorothiazide (ZESTORETIC) 10-12.5 MG tablet Take 1 tablet by mouth daily.  . simvastatin (ZOCOR) 40 MG tablet Take 1 tablet (40 mg total) by mouth at bedtime.  . tamsulosin (FLOMAX) 0.4 MG CAPS capsule Take 1 capsule (0.4 mg total) by mouth daily after supper.   No facility-administered encounter medications on file as of 05/04/2020.    Past Medical  History:  Diagnosis Date  . Fracture of thumb    left  . Hyperlipidemia   . Hypertension   . Melanoma ([place])    BACK AND STOMACH   . Prostate enlargement   . Sinusitis   . Tobacco dependence     Past Surgical History:  Procedure Laterality Date  . LYMPH NODE BIOPSY    . MELANOMA EXCISION    . PARTIAL COLECTOMY    . SPLENECTOMY, TOTAL      Social History   Socioeconomic History  . Marital status: Widowed    Spouse name: Not on file  . Number of children: 1  . Years of education: 33  . Highest education level: 9th grade  Occupational History  . Occupation: retired  Tobacco Use  . Smoking status: Current Every Day Smoker    Packs/day: 1.00    Years: 60.00    Pack years: 60.00    Types: Cigarettes  . Smokeless tobacco: Never Used  Vaping Use  . Vaping Use: Never used  Substance and Sexual Activity  . Alcohol use: No  . Drug use: No  . Sexual activity: Not Currently  Other Topics Concern  . Not on file  Social History Narrative  . Not on file   Social Determinants of Health   Financial Resource Strain: Not on file   Food Insecurity: Not on file  Transportation Needs: Not on file  Physical Activity: Not on file  Stress: Not on file  Social Connections: Not on file  Intimate Partner Violence: Not on file    Family History  Problem Relation Age of Onset  . Coronary artery disease Father   . COPD Father   . Prostatitis Brother   . Diabetes Son   . Prostatitis Brother   . Alcohol abuse Brother        Objective: BP 133/87   Pulse 87   Temp 98.1 F (36.7 C)   Ht 6' (1.829 m)   Wt 212 lb (96.2 kg)   BMI 28.75 kg/m     Physical Exam Vitals reviewed.  Constitutional:      Appearance: Normal appearance.  Genitourinary:    Comments: AP without lesions. NST without mass. Prostate 2.5-3+ without nodules. SV non-palpable.  Neurological:     Mental Status: He is alert.     Lab Results:  Results for orders placed or performed in visit on 05/04/20 (from the past 24 hour(s))  Urinalysis, Routine w reflex microscopic     Status: Abnormal   Collection Time: 05/04/20  8:59 AM  Result Value Ref Range   Specific Gravity, UA 1.015 1.005 - 1.030   pH, UA 7.0 5.0 - 7.5   Color, UA Yellow Yellow   Appearance Ur Clear Clear   Leukocytes,UA Trace (A) Negative   Protein,UA Negative Negative/Trace   Glucose, UA Negative Negative   Ketones, UA Negative Negative   RBC, UA Negative Negative   Bilirubin, UA Negative Negative   Urobilinogen, Ur 0.2 0.2 - 1.0 mg/dL   Nitrite, UA Negative Negative   Microscopic Examination See below:    Narrative   Performed at:  [facility] [address] [place], [place]  [id number] Lab Director: Mina Marble MT, Phone:  [phone]  Microscopic Examination     Status: None   Collection Time: 05/04/20  8:59 AM   Urine  Result Value Ref Range   WBC, UA 0-5 0 - 5 /hpf   RBC None seen 0 - 2 /hpf  Epithelial Cells (non renal) 0-10 0 - 10 /hpf   Renal Epithel, UA None seen None seen /hpf   Bacteria, UA Few None seen/Few   Narrative    Performed at:  [facility] [address] [place], [place]  [id number] Lab Director: [place], Phone:  [phone]    BMET No results for input(s): NA, K, CL, CO2, GLUCOSE, BUN, CREATININE, CALCIUM in the last 72 hours. PSA 2.8   Prior records and IPSS reviewed.  Christene Slates  Studies/Results: No results found.  Recent Results (from the past 2160 hour(s))  CBC with Differential/Platelet     Status: Abnormal   Collection Time: 04/03/20  8:43 AM  Result Value Ref Range   WBC 9.0 3.4 - 10.8 x10E3/uL   RBC 4.91 4.14 - 5.80 x10E6/uL   Hemoglobin 16.2 13.0 - 17.7 g/dL   Hematocrit 47.1 37.5 - 51.0 %   MCV 96 79 - 97 fL   MCH 33.0 26.6 - 33.0 pg   MCHC 34.4 31.5 - 35.7 g/dL   RDW 11.9 11.6 - 15.4 %   Platelets 184 150 - 450 x10E3/uL   Neutrophils 53 Not Estab. %   Lymphs 29 Not Estab. %   Monocytes 13 Not Estab. %   Eos 4 Not Estab. %   Basos 1 Not Estab. %   Neutrophils Absolute 4.8 1.4 - 7.0 x10E3/uL   Lymphocytes Absolute 2.6 0.7 - 3.1 x10E3/uL   Monocytes Absolute 1.1 (H) 0.1 - 0.9 x10E3/uL   EOS (ABSOLUTE) 0.3 0.0 - 0.4 x10E3/uL   Basophils Absolute 0.1 0.0 - 0.2 x10E3/uL   Immature Granulocytes 0 Not Estab. %   Immature Grans (Abs) 0.0 0.0 - 0.1 x10E3/uL  CMP14+EGFR     Status: Abnormal   Collection Time: 04/03/20  8:43 AM  Result Value Ref Range   Glucose 115 (H) 65 - 99 mg/dL   BUN 15 8 - 27 mg/dL   Creatinine, Ser 1.16 0.76 - 1.27 mg/dL   GFR calc non Af Amer 60 >59 mL/min/1.73   GFR calc Af Amer 69 >59 mL/min/1.73    Comment: **In accordance with recommendations from the NKF-ASN Task force,**   [facility] is in the process of updating its eGFR calculation to the   2021 CKD-EPI creatinine equation that estimates kidney function   without a race variable.    BUN/Creatinine Ratio 13 10 - 24   Sodium 140 134 - 144 mmol/L   Potassium 4.1 3.5 - 5.2 mmol/L   Chloride 102 96 - 106 mmol/L   CO2 21 20 - 29 mmol/L   Calcium 9.7 8.6 - 10.2 mg/dL    Total Protein 7.0 6.0 - 8.5 g/dL   Albumin 4.6 3.7 - 4.7 g/dL   Globulin, Total 2.4 1.5 - 4.5 g/dL   Albumin/Globulin Ratio 1.9 1.2 - 2.2   Bilirubin Total 0.6 0.0 - 1.2 mg/dL   Alkaline Phosphatase 41 (L) 44 - 121 IU/L   AST 23 0 - 40 IU/L   ALT 22 0 - 44 IU/L  Lipid panel     Status: None   Collection Time: 04/03/20  8:43 AM  Result Value Ref Range   Cholesterol, Total 149 100 - 199 mg/dL   Triglycerides 86 0 - 149 mg/dL   HDL 50 >39 mg/dL   VLDL Cholesterol Cal 16 5 - 40 mg/dL   LDL Chol Calc (NIH) 83 0 - 99 mg/dL   Chol/HDL Ratio 3.0 0.0 - 5.0 ratio  Comment:                                   T. Chol/HDL Ratio                                             Men  Women                               1/2 Avg.Risk  3.4    3.3                                   Avg.Risk  5.0    4.4                                2X Avg.Risk  9.6    7.1                                3X Avg.Risk 23.4   11.0   Urinalysis, Routine w reflex microscopic     Status: Abnormal   Collection Time: 05/04/20  8:59 AM  Result Value Ref Range   Specific Gravity, UA 1.015 1.005 - 1.030   pH, UA 7.0 5.0 - 7.5   Color, UA Yellow Yellow   Appearance Ur Clear Clear   Leukocytes,UA Trace (A) Negative   Protein,UA Negative Negative/Trace   Glucose, UA Negative Negative   Ketones, UA Negative Negative   RBC, UA Negative Negative   Bilirubin, UA Negative Negative   Urobilinogen, Ur 0.2 0.2 - 1.0 mg/dL   Nitrite, UA Negative Negative   Microscopic Examination See below:   Microscopic Examination     Status: None   Collection Time: 05/04/20  8:59 AM   Urine  Result Value Ref Range   WBC, UA 0-5 0 - 5 /hpf   RBC None seen 0 - 2 /hpf   Epithelial Cells (non renal) 0-10 0 - 10 /hpf   Renal Epithel, UA None seen None seen /hpf   Bacteria, UA Few None seen/Few     Assessment & Plan: Prostate cancer on surveillance.   PSA has been suppressed with  finasteride.   He will continue that medication.   I will get a PSA  today.   BPH with BOO and frequency.  He is doing well on tamsulosin and finasteride.     No orders of the defined types were placed in this encounter.    Orders Placed This Encounter  Procedures  . Microscopic Examination  . Urinalysis, Routine w reflex microscopic  . PSA  . PSA    Standing Status:   Future    Standing Expiration Date:   05/04/2021      Return in about 6 months (around 11/04/2020) for with PSA.   CC: Chevis Pretty, FNP      Irine Seal 05/04/2020

## 2020-05-04 ENCOUNTER — Encounter: Payer: Self-pay | Admitting: Urology

## 2020-05-04 ENCOUNTER — Telehealth: Payer: Self-pay

## 2020-05-04 ENCOUNTER — Other Ambulatory Visit: Payer: Self-pay

## 2020-05-04 ENCOUNTER — Ambulatory Visit (INDEPENDENT_AMBULATORY_CARE_PROVIDER_SITE_OTHER): Payer: Medicare Other | Admitting: Urology

## 2020-05-04 VITALS — BP 133/87 | HR 87 | Temp 98.1°F | Ht 72.0 in | Wt 212.0 lb

## 2020-05-04 DIAGNOSIS — N401 Enlarged prostate with lower urinary tract symptoms: Secondary | ICD-10-CM

## 2020-05-04 DIAGNOSIS — C61 Malignant neoplasm of prostate: Secondary | ICD-10-CM

## 2020-05-04 DIAGNOSIS — N138 Other obstructive and reflux uropathy: Secondary | ICD-10-CM

## 2020-05-04 DIAGNOSIS — R351 Nocturia: Secondary | ICD-10-CM | POA: Diagnosis not present

## 2020-05-04 LAB — URINALYSIS, ROUTINE W REFLEX MICROSCOPIC
Bilirubin, UA: NEGATIVE
Glucose, UA: NEGATIVE
Ketones, UA: NEGATIVE
Nitrite, UA: NEGATIVE
Protein,UA: NEGATIVE
RBC, UA: NEGATIVE
Specific Gravity, UA: 1.015 (ref 1.005–1.030)
Urobilinogen, Ur: 0.2 mg/dL (ref 0.2–1.0)
pH, UA: 7 (ref 5.0–7.5)

## 2020-05-04 LAB — MICROSCOPIC EXAMINATION
RBC, Urine: NONE SEEN /hpf (ref 0–2)
Renal Epithel, UA: NONE SEEN /hpf

## 2020-05-04 NOTE — Telephone Encounter (Signed)
Please review

## 2020-05-04 NOTE — Telephone Encounter (Signed)
I did not put in an order for PSA because Dr. Jeffie Pollock already had an order in there for it to be done.

## 2020-05-04 NOTE — Telephone Encounter (Signed)
Called and spoke with patient and advised that Dr Jeffie Pollock had placed order per chart but was overlooked when he came in for regular follow up and had routine labs drawn. Advised patient I would make a sticky note reminder in the chart to check for orders from Dr Jeffie Pollock every time patient came in and I would notifiy the lab that additional labs are ordered to be drawn with his regular labwork. Patient verbalized understanding

## 2020-05-04 NOTE — Progress Notes (Signed)

## 2020-05-05 ENCOUNTER — Ambulatory Visit: Payer: Medicare Other | Admitting: Urology

## 2020-05-05 LAB — PSA: Prostate Specific Ag, Serum: 3.6 ng/mL (ref 0.0–4.0)

## 2020-05-05 NOTE — Progress Notes (Signed)
Results mailed to patient;.

## 2020-06-21 ENCOUNTER — Other Ambulatory Visit: Payer: Self-pay | Admitting: Nurse Practitioner

## 2020-06-21 DIAGNOSIS — E782 Mixed hyperlipidemia: Secondary | ICD-10-CM

## 2020-07-31 ENCOUNTER — Other Ambulatory Visit: Payer: Self-pay

## 2020-07-31 ENCOUNTER — Encounter: Payer: Self-pay | Admitting: Family

## 2020-07-31 ENCOUNTER — Ambulatory Visit (INDEPENDENT_AMBULATORY_CARE_PROVIDER_SITE_OTHER): Payer: Medicare Other | Admitting: Family

## 2020-07-31 VITALS — BP 114/77 | HR 82 | Temp 97.7°F | Ht 72.0 in | Wt 208.0 lb

## 2020-07-31 DIAGNOSIS — L253 Unspecified contact dermatitis due to other chemical products: Secondary | ICD-10-CM | POA: Diagnosis not present

## 2020-07-31 MED ORDER — TRIAMCINOLONE ACETONIDE 0.5 % EX OINT: 1.0000 "application " | TOPICAL_OINTMENT | Freq: Two times a day (BID) | CUTANEOUS | 0 refills | Status: DC

## 2020-07-31 MED ORDER — BETAMETHASONE SOD PHOS & ACET 30 MG/5ML IJ KIT
6.0000 mg | PACK | Freq: Once | INTRAMUSCULAR | Status: AC
  Administered 2020-07-31: 6 mg via INTRAMUSCULAR

## 2020-07-31 MED ORDER — PREDNISONE 10 MG (21) PO TBPK: ORAL_TABLET | ORAL | 0 refills | Status: DC

## 2020-07-31 NOTE — Progress Notes (Signed)
Subjective:    Patient ID: Andre Estrada, male    DOB: [birth year], 79 y.o.   MRN: [id number]  Chief Complaint  Patient presents with   Arm Swelling    Left arm swelling    Pt presents to the office today with left hand swelling and pruritis. He reports he was spraying grass/weed killer in his yard and at his church and reports since his left hand is red and swollen and rash on his right lower leg.  Rash This is a new problem. The current episode started in the past 7 days. The affected locations include the left arm, left hand and right lower leg. The rash is characterized by redness, itchiness and dryness. He was exposed to plant contact and chemicals. Past treatments include nothing. The treatment provided mild relief.     Review of Systems  Constitutional: Negative.   HENT: Negative.    Respiratory: Negative.    Cardiovascular: Negative.   Gastrointestinal: Negative.   Endocrine: Negative.   Genitourinary: Negative.   Musculoskeletal: Negative.   Skin:  Positive for rash.  Neurological: Negative.   Hematological: Negative.   Psychiatric/Behavioral: Negative.    All other systems reviewed and are negative.     Objective:   Physical Exam Vitals reviewed.  Constitutional:      General: He is not in acute distress.    Appearance: He is well-developed. He is obese.  HENT:     Head: Normocephalic.     Right Ear: Tympanic membrane normal.     Left Ear: Tympanic membrane normal.  Eyes:     General:        Right eye: No discharge.        Left eye: No discharge.     Pupils: Pupils are equal, round, and reactive to light.  Neck:     Thyroid: No thyromegaly.  Cardiovascular:     Rate and Rhythm: Normal rate and regular rhythm.     Heart sounds: Normal heart sounds. No murmur heard. Pulmonary:     Effort: Pulmonary effort is normal. No respiratory distress.     Breath sounds: Normal breath sounds. No wheezing.  Abdominal:     General: Bowel sounds are normal. There is  no distension.     Palpations: Abdomen is soft.     Tenderness: There is no abdominal tenderness.  Musculoskeletal:        General: No tenderness. Normal range of motion.     Cervical back: Normal range of motion and neck supple.  Skin:    General: Skin is warm and dry.     Findings: Rash present. No erythema.     Comments: Left hand erythemas, blisters, and swelling. Right lower leg erythemas and papule rash present.   Neurological:     Mental Status: He is alert and oriented to person, place, and time.     Cranial Nerves: No cranial nerve deficit.     Deep Tendon Reflexes: Reflexes are normal and symmetric.  Psychiatric:        Behavior: Behavior normal.        Thought Content: Thought content normal.        Judgment: Judgment normal.      BP 114/77   Pulse 82   Temp 97.7 F (36.5 C) (Temporal)   Ht 6' (1.829 m)   Wt 208 lb (94.3 kg)   BMI 28.21 kg/m      Assessment & Plan:  [facility] comes in today with chief complaint  of Arm Swelling (Left arm swelling )   Diagnosis and orders addressed:  1. Contact dermatitis due to chemicals Avoid using weed killer any more. Do not scratch  Keep cool and dry Report any s/s of redness or infection - Betamethasone Sod Phos & Acet 30 MG/5ML KIT 6 mg - triamcinolone ointment (KENALOG) 0.5 %; Apply 1 application topically 2 (two) times daily.  Dispense: 30 g; Refill: 0 - predniSONE (STERAPRED UNI-PAK 21 TAB) 10 MG (21) TBPK tablet; Use as directed  Dispense: 21 tablet; Refill: 0   Evelina Dun, FNP

## 2020-07-31 NOTE — Patient Instructions (Signed)
Contact Dermatitis Dermatitis is redness, soreness, and swelling (inflammation) of the skin. Contact dermatitis is a reaction to certain substances that touch the skin. Many different substances can cause contact dermatitis. There are two types of contact dermatitis: Irritant contact dermatitis. This type is caused by something that irritates your skin, such as having dry hands from washing them too often with soap. This type does not require previous exposure to the substance for a reaction to occur. This is the most common type. Allergic contact dermatitis. This type is caused by a substance that you are allergic to, such as poison ivy. This type occurs when you have been exposed to the substance (allergen) and develop a sensitivity to it. Dermatitis may develop soon after your first exposure to the allergen, or it may not develop until the next time you are exposed and every time thereafter. What are the causes? Irritant contact dermatitis is most commonly caused by exposure to: Makeup. Soaps. Detergents. Bleaches. Acids. Metal salts, such as nickel. Allergic contact dermatitis is most commonly caused by exposure to: Poisonous plants. Chemicals. Jewelry. Latex. Medicines. Preservatives in products, such as clothing. What increases the risk? You are more likely to develop this condition if you have: A job that exposes you to irritants or allergens. Certain medical conditions, such as asthma or eczema. What are the signs or symptoms? Symptoms of this condition may occur on your body anywhere the irritant has touched you or is touched by you. Symptoms include: Dryness or flaking. Redness. Cracks. Itching. Pain or a burning feeling. Blisters. Drainage of small amounts of blood or clear fluid from skin cracks. With allergic contact dermatitis, there may also be swelling in areas such asthe eyelids, mouth, or genitals. How is this diagnosed? This condition is diagnosed with a medical  history and physical exam. A patch skin test may be performed to help determine the cause. If the condition is related to your job, you may need to see an occupational medicine specialist. How is this treated? This condition is treated by checking for the cause of the reaction and protecting your skin from further contact. Treatment may also include: Steroid creams or ointments. Oral steroid medicines may be needed in more severe cases. Antibiotic medicines or antibacterial ointments, if a skin infection is present. Antihistamine lotion or an antihistamine taken by mouth to ease itching. A bandage (dressing). Follow these instructions at home: Skin care Moisturize your skin as needed. Apply cool compresses to the affected areas. Try applying baking soda paste to your skin. Stir water into baking soda until it reaches a paste-like consistency. Do not scratch your skin, and avoid friction to the affected area. Avoid the use of soaps, perfumes, and dyes. Medicines Take or apply over-the-counter and prescription medicines only as told by your health care provider. If you were prescribed an antibiotic medicine, take or apply the antibiotic as told by your health care provider. Do not stop using the antibiotic even if your condition improves. Bathing Try taking a bath with: Epsom salts. Follow the instructions on the packaging. You can get these at your local pharmacy or grocery store. Baking soda. Pour a small amount into the bath as directed by your health care provider. Colloidal oatmeal. Follow the instructions on the packaging. You can get this at your local pharmacy or grocery store. Bathe less frequently, such as every other day. Bathe in lukewarm water. Avoid using hot water. Bandage care If you were given a bandage (dressing), change it as told by   your health care provider. Wash your hands with soap and water before and after you change your dressing. If soap and water are not  available, use hand sanitizer. General instructions Avoid the substance that caused your reaction. If you do not know what caused it, keep a journal to try to track what caused it. Write down: What you eat. What cosmetic products you use. What you drink. What you wear in the affected area. This includes jewelry. Check the affected areas every day for signs of infection. Check for: More redness, swelling, or pain. More fluid or blood. Warmth. Pus or a bad smell. Keep all follow-up visits as told by your health care provider. This is important. Contact a health care provider if: Your condition does not improve with treatment. Your condition gets worse. You have signs of infection such as swelling, tenderness, redness, soreness, or warmth in the affected area. You have a fever. You have new symptoms. Get help right away if: You have a severe headache, neck pain, or neck stiffness. You vomit. You feel very sleepy. You notice red streaks coming from the affected area. Your bone or joint underneath the affected area becomes painful after the skin has healed. The affected area turns darker. You have difficulty breathing. Summary Dermatitis is redness, soreness, and swelling (inflammation) of the skin. Contact dermatitis is a reaction to certain substances that touch the skin. Symptoms of this condition may occur on your body anywhere the irritant has touched you or is touched by you. This condition is treated by figuring out what caused the reaction and protecting your skin from further contact. Treatment may also include medicines and skin care. Avoid the substance that caused your reaction. If you do not know what caused it, keep a journal to try to track what caused it. Contact a health care provider if your condition gets worse or you have signs of infection such as swelling, tenderness, redness, soreness, or warmth in the affected area. This information is not intended to replace  advice given to you by your health care provider. Make sure you discuss any questions you have with your healthcare provider. Document Revised: 05/27/2018 Document Reviewed: 08/20/2017 Elsevier Patient Education  2022 [employer]  

## 2020-10-02 ENCOUNTER — Ambulatory Visit (INDEPENDENT_AMBULATORY_CARE_PROVIDER_SITE_OTHER): Payer: Medicare Other | Admitting: Nurse Practitioner

## 2020-10-02 ENCOUNTER — Other Ambulatory Visit: Payer: Self-pay

## 2020-10-02 ENCOUNTER — Encounter: Payer: Self-pay | Admitting: Nurse Practitioner

## 2020-10-02 VITALS — BP 106/81 | HR 80 | Temp 97.9°F | Resp 20 | Ht 72.0 in | Wt 196.0 lb

## 2020-10-02 DIAGNOSIS — E782 Mixed hyperlipidemia: Secondary | ICD-10-CM | POA: Diagnosis not present

## 2020-10-02 DIAGNOSIS — I10 Essential (primary) hypertension: Secondary | ICD-10-CM

## 2020-10-02 DIAGNOSIS — K432 Incisional hernia without obstruction or gangrene: Secondary | ICD-10-CM | POA: Diagnosis not present

## 2020-10-02 DIAGNOSIS — Q825 Congenital non-neoplastic nevus: Secondary | ICD-10-CM

## 2020-10-02 DIAGNOSIS — Z6828 Body mass index (BMI) 28.0-28.9, adult: Secondary | ICD-10-CM

## 2020-10-02 DIAGNOSIS — N400 Enlarged prostate without lower urinary tract symptoms: Secondary | ICD-10-CM

## 2020-10-02 LAB — CMP14+EGFR
ALT: 15 IU/L (ref 0–44)
AST: 22 IU/L (ref 0–40)
Albumin/Globulin Ratio: 2.1 (ref 1.2–2.2)
Albumin: 4.6 g/dL (ref 3.7–4.7)
Alkaline Phosphatase: 42 IU/L — ABNORMAL LOW (ref 44–121)
BUN/Creatinine Ratio: 8 — ABNORMAL LOW (ref 10–24)
BUN: 9 mg/dL (ref 8–27)
Bilirubin Total: 0.9 mg/dL (ref 0.0–1.2)
CO2: 25 mmol/L (ref 20–29)
Calcium: 9.8 mg/dL (ref 8.6–10.2)
Chloride: 101 mmol/L (ref 96–106)
Creatinine, Ser: 1.1 mg/dL (ref 0.76–1.27)
Globulin, Total: 2.2 g/dL (ref 1.5–4.5)
Glucose: 108 mg/dL — ABNORMAL HIGH (ref 65–99)
Potassium: 3.9 mmol/L (ref 3.5–5.2)
Sodium: 140 mmol/L (ref 134–144)
Total Protein: 6.8 g/dL (ref 6.0–8.5)
eGFR: 69 mL/min/{1.73_m2} (ref >59)

## 2020-10-02 LAB — CBC WITH DIFFERENTIAL/PLATELET
Basophils Absolute: 0.1 10*3/uL (ref 0.0–0.2)
Basos: 1 %
EOS (ABSOLUTE): 0.3 10*3/uL (ref 0.0–0.4)
Eos: 3 %
Hematocrit: 43.7 % (ref 37.5–51.0)
Hemoglobin: 15.4 g/dL (ref 13.0–17.7)
Immature Grans (Abs): 0 10*3/uL (ref 0.0–0.1)
Immature Granulocytes: 0 %
Lymphocytes Absolute: 2.8 10*3/uL (ref 0.7–3.1)
Lymphs: 28 %
MCH: 33.3 pg — ABNORMAL HIGH (ref 26.6–33.0)
MCHC: 35.2 g/dL (ref 31.5–35.7)
MCV: 95 fL (ref 79–97)
Monocytes Absolute: 1.3 10*3/uL — ABNORMAL HIGH (ref 0.1–0.9)
Monocytes: 13 %
Neutrophils Absolute: 5.6 10*3/uL (ref 1.4–7.0)
Neutrophils: 55 %
Platelets: 126 10*3/uL — ABNORMAL LOW (ref 150–450)
RBC: 4.62 x10E6/uL (ref 4.14–5.80)
RDW: 12 % (ref 11.6–15.4)
WBC: 10.1 10*3/uL (ref 3.4–10.8)

## 2020-10-02 LAB — LIPID PANEL
Chol/HDL Ratio: 3 ratio (ref 0.0–5.0)
Cholesterol, Total: 137 mg/dL (ref 100–199)
HDL: 46 mg/dL (ref >39)
LDL Chol Calc (NIH): 70 mg/dL (ref 0–99)
Triglycerides: 114 mg/dL (ref 0–149)
VLDL Cholesterol Cal: 21 mg/dL (ref 5–40)

## 2020-10-02 MED ORDER — SIMVASTATIN 40 MG PO TABS: 40.0000 mg | ORAL_TABLET | Freq: Every day | ORAL | 1 refills | Status: DC

## 2020-10-02 MED ORDER — LISINOPRIL-HYDROCHLOROTHIAZIDE 10-12.5 MG PO TABS: 1.0000 | ORAL_TABLET | Freq: Every day | ORAL | 1 refills | Status: DC

## 2020-10-02 NOTE — Patient Instructions (Signed)
https://www.nhlbi.nih.gov/files/docs/public/heart/dash_brief.pdf">  DASH Eating Plan DASH stands for Dietary Approaches to Stop Hypertension. The DASH eating plan is a healthy eating plan that has been shown to: Reduce high blood pressure (hypertension). Reduce your risk for type 2 diabetes, heart disease, and stroke. Help with weight loss. What are tips for following this plan? Reading food labels Check food labels for the amount of salt (sodium) per serving. Choose foods with less than 5 percent of the Daily Value of sodium. Generally, foods with less than 300 milligrams (mg) of sodium per serving fit into this eating plan. To find whole grains, look for the word "whole" as the first word in the ingredient list. Shopping Buy products labeled as "low-sodium" or "no salt added." Buy fresh foods. Avoid canned foods and pre-made or frozen meals. Cooking Avoid adding salt when cooking. Use salt-free seasonings or herbs instead of table salt or sea salt. Check with your health care provider or pharmacist before using salt substitutes. Do not fry foods. Cook foods using healthy methods such as baking, boiling, grilling, roasting, and broiling instead. Cook with heart-healthy oils, such as olive, canola, avocado, soybean, or sunflower oil. Meal planning  Eat a balanced diet that includes: 4 or more servings of fruits and 4 or more servings of vegetables each day. Try to fill one-half of your plate with fruits and vegetables. 6-8 servings of whole grains each day. Less than 6 oz (170 g) of lean meat, poultry, or fish each day. A 3-oz (85-g) serving of meat is about the same size as a deck of cards. One egg equals 1 oz (28 g). 2-3 servings of low-fat dairy each day. One serving is 1 cup (237 mL). 1 serving of nuts, seeds, or beans 5 times each week. 2-3 servings of heart-healthy fats. Healthy fats called omega-3 fatty acids are found in foods such as walnuts, flaxseeds, fortified milks, and eggs.  These fats are also found in cold-water fish, such as sardines, salmon, and mackerel. Limit how much you eat of: Canned or prepackaged foods. Food that is high in trans fat, such as some fried foods. Food that is high in saturated fat, such as fatty meat. Desserts and other sweets, sugary drinks, and other foods with added sugar. Full-fat dairy products. Do not salt foods before eating. Do not eat more than 4 egg yolks a week. Try to eat at least 2 vegetarian meals a week. Eat more home-cooked food and less restaurant, buffet, and fast food.  Lifestyle When eating at a restaurant, ask that your food be prepared with less salt or no salt, if possible. If you drink alcohol: Limit how much you use to: 0-1 drink a day for women who are not pregnant. 0-2 drinks a day for men. Be aware of how much alcohol is in your drink. In the [place] one drink equals one 12 oz bottle of beer (355 mL), one 5 oz glass of wine (148 mL), or one 1 oz glass of hard liquor (44 mL). General information Avoid eating more than 2,300 mg of salt a day. If you have hypertension, you may need to reduce your sodium intake to 1,500 mg a day. Work with your health care provider to maintain a healthy body weight or to lose weight. Ask what an ideal weight is for you. Get at least 30 minutes of exercise that causes your heart to beat faster (aerobic exercise) most days of the week. Activities may include walking, swimming, or biking. Work with your health care provider   or dietitian to adjust your eating plan to your individual calorie needs. What foods should I eat? Fruits All fresh, dried, or frozen fruit. Canned fruit in natural juice (without addedsugar). Vegetables Fresh or frozen vegetables (raw, steamed, roasted, or grilled). Low-sodium or reduced-sodium tomato and vegetable juice. Low-sodium or reduced-sodium tomatosauce and tomato paste. Low-sodium or reduced-sodium canned vegetables. Grains Whole-grain or  whole-wheat bread. Whole-grain or whole-wheat pasta. Brown rice. Oatmeal. Quinoa. Bulgur. Whole-grain and low-sodium cereals. Pita bread.Low-fat, low-sodium crackers. Whole-wheat flour tortillas. Meats and other proteins Skinless chicken or turkey. Ground chicken or turkey. Pork with fat trimmed off. Fish and seafood. Egg whites. Dried beans, peas, or lentils. Unsalted nuts, nut butters, and seeds. Unsalted canned beans. Lean cuts of beef with fat trimmed off. Low-sodium, lean precooked or cured meat, such as sausages or meatloaves. Dairy Low-fat (1%) or fat-free (skim) milk. Reduced-fat, low-fat, or fat-free cheeses. Nonfat, low-sodium ricotta or cottage cheese. Low-fat or nonfatyogurt. Low-fat, low-sodium cheese. Fats and oils Soft margarine without trans fats. Vegetable oil. Reduced-fat, low-fat, or light mayonnaise and salad dressings (reduced-sodium). Canola, safflower, olive, avocado, soybean, andsunflower oils. Avocado. Seasonings and condiments Herbs. Spices. Seasoning mixes without salt. Other foods Unsalted popcorn and pretzels. Fat-free sweets. The items listed above may not be a complete list of foods and beverages you can eat. Contact a dietitian for more information. What foods should I avoid? Fruits Canned fruit in a light or heavy syrup. Fried fruit. Fruit in cream or buttersauce. Vegetables Creamed or fried vegetables. Vegetables in a cheese sauce. Regular canned vegetables (not low-sodium or reduced-sodium). Regular canned tomato sauce and paste (not low-sodium or reduced-sodium). Regular tomato and vegetable juice(not low-sodium or reduced-sodium). Pickles. Olives. Grains Baked goods made with fat, such as croissants, muffins, or some breads. Drypasta or rice meal packs. Meats and other proteins Fatty cuts of meat. Ribs. Fried meat. Bacon. Bologna, salami, and other precooked or cured meats, such as sausages or meat loaves. Fat from the back of a pig (fatback). Bratwurst.  Salted nuts and seeds. Canned beans with added salt. Canned orsmoked fish. Whole eggs or egg yolks. Chicken or turkey with skin. Dairy Whole or 2% milk, cream, and half-and-half. Whole or full-fat cream cheese. Whole-fat or sweetened yogurt. Full-fat cheese. Nondairy creamers. Whippedtoppings. Processed cheese and cheese spreads. Fats and oils Butter. Stick margarine. Lard. Shortening. Ghee. Bacon fat. Tropical oils, suchas coconut, palm kernel, or palm oil. Seasonings and condiments Onion salt, garlic salt, seasoned salt, table salt, and sea salt. Worcestershire sauce. Tartar sauce. Barbecue sauce. Teriyaki sauce. Soy sauce, including reduced-sodium. Steak sauce. Canned and packaged gravies. Fish sauce. Oyster sauce. Cocktail sauce. Store-bought horseradish. Ketchup. Mustard. Meat flavorings and tenderizers. Bouillon cubes. Hot sauces. Pre-made or packaged marinades. Pre-made or packaged taco seasonings. Relishes. Regular saladdressings. Other foods Salted popcorn and pretzels. The items listed above may not be a complete list of foods and beverages you should avoid. Contact a dietitian for more information. Where to find more information National Heart, Lung, and Blood Institute: www.nhlbi.nih.gov American Heart Association: www.heart.org Academy of Nutrition and Dietetics: www.eatright.org National Kidney Foundation: www.kidney.org Summary The DASH eating plan is a healthy eating plan that has been shown to reduce high blood pressure (hypertension). It may also reduce your risk for type 2 diabetes, heart disease, and stroke. When on the DASH eating plan, aim to eat more fresh fruits and vegetables, whole grains, lean proteins, low-fat dairy, and heart-healthy fats. With the DASH eating plan, you should limit salt (sodium) intake to 2,300   mg a day. If you have hypertension, you may need to reduce your sodium intake to 1,500 mg a day. Work with your health care provider or dietitian to adjust  your eating plan to your individual calorie needs. This information is not intended to replace advice given to you by your health care provider. Make sure you discuss any questions you have with your healthcare provider. Document Revised: 01/08/2019 Document Reviewed: 01/08/2019 Elsevier Patient Education  2022 Elsevier Inc.  

## 2020-10-02 NOTE — Progress Notes (Signed)
Subjective:    Patient ID: Andre Estrada, male    DOB: 08-08-1940, 80 y.o.   MRN: [id number]   Chief Complaint: medical management of chronic issues     HPI:  1. Primary hypertension No c/o chest pain, sob or headache. Does not check blood pressure at home. BP Readings from Last 3 Encounters:  07/31/20 114/77  05/04/20 133/87  04/03/20 124/84     2. Mixed hyperlipidemia He does not really watch diet and does no dedicated exercise. He does try to stay active though. Lab Results  Component Value Date   CHOL 149 04/03/2020   HDL 50 04/03/2020   LDLCALC 83 04/03/2020   TRIG 86 04/03/2020   CHOLHDL 3.0 04/03/2020     3. Incisional hernia, without obstruction or gangrene Denies any pain from hernia  4. Birth mark Has large birth mark o left side of face. Patient says has  not changed in years.  5. Benign prostatic hyperplasia without lower urinary tract symptoms Denies any problems voiding. Saw dr. Jeffie Pollock on 05/04/20., according to office no changes were made to plan of care. Lab Results  Component Value Date   PSA1 3.6 05/04/2020   PSA1 2.8 09/30/2019   PSA1 4.1 (H) 04/01/2019   PSA 6.5 (H) 02/14/2014      6. BMI 28.0-28.9,adult No recent weight changes BMI Readings from Last 3 Encounters:  07/31/20 28.21 kg/m  05/04/20 28.75 kg/m  04/03/20 29.02 kg/m   Wt Readings from Last 3 Encounters:  07/31/20 208 lb (94.3 kg)  05/04/20 212 lb (96.2 kg)  04/03/20 214 lb (97.1 kg)       Outpatient Encounter Medications as of 10/02/2020  Medication Sig   aspirin 81 MG tablet Take 81 mg by mouth daily.   finasteride (PROSCAR) 5 MG tablet Take 1 tablet (5 mg total) by mouth daily.   hydrocortisone 1 % lotion Apply 1 application topically 2 (two) times daily.   lisinopril-hydrochlorothiazide (ZESTORETIC) 10-12.5 MG tablet Take 1 tablet by mouth daily.   predniSONE (STERAPRED UNI-PAK 21 TAB) 10 MG (21) TBPK tablet Use as directed   simvastatin (ZOCOR) 40 MG tablet  TAKE 1 TABLET BY MOUTH AT BEDTIME   tamsulosin (FLOMAX) 0.4 MG CAPS capsule Take 1 capsule (0.4 mg total) by mouth daily after supper.   triamcinolone ointment (KENALOG) 0.5 % Apply 1 application topically 2 (two) times daily.   No facility-administered encounter medications on file as of 10/02/2020.    Past Surgical History:  Procedure Laterality Date   LYMPH NODE BIOPSY     MELANOMA EXCISION     PARTIAL COLECTOMY     SPLENECTOMY, TOTAL      Family History  Problem Relation Age of Onset   Coronary artery disease Father    COPD Father    Prostatitis Brother    Diabetes Son    Prostatitis Brother    Alcohol abuse Brother     New complaints: None today  Social history: Lives by hisself  Controlled substance contract: n/a     Review of Systems  Constitutional:  Negative for diaphoresis.  Eyes:  Negative for pain.  Respiratory:  Negative for shortness of breath.   Cardiovascular:  Negative for chest pain, palpitations and leg swelling.  Gastrointestinal:  Negative for abdominal pain.  Endocrine: Negative for polydipsia.  Skin:  Negative for rash.  Neurological:  Negative for dizziness, weakness and headaches.  Hematological:  Does not bruise/bleed easily.  All other systems reviewed and are negative.  Objective:   Physical Exam Vitals and nursing note reviewed.  Constitutional:      Appearance: Normal appearance. He is well-developed.  HENT:     Head: Normocephalic.     Nose: Nose normal.     Mouth/Throat:     Comments: multiple dental caries on bottom Eyes:     Pupils: Pupils are equal, round, and reactive to light.  Neck:     Thyroid: No thyroid mass or thyromegaly.     Vascular: No carotid bruit or JVD.     Trachea: Phonation normal.  Cardiovascular:     Rate and Rhythm: Normal rate and regular rhythm.  Pulmonary:     Effort: Pulmonary effort is normal. No respiratory distress.     Breath sounds: Normal breath sounds.  Abdominal:     General:  Bowel sounds are normal.     Palpations: Abdomen is soft.     Tenderness: There is no abdominal tenderness.     Hernia: A hernia (large incisional hernia- soft and nontender) is present.  Musculoskeletal:        General: Normal range of motion.     Cervical back: Normal range of motion and neck supple.  Lymphadenopathy:     Cervical: No cervical adenopathy.  Skin:    General: Skin is warm and dry.     Comments: Rge birthmark on left side of face- unchanged  Neurological:     Mental Status: He is alert and oriented to person, place, and time.  Psychiatric:        Behavior: Behavior normal.        Thought Content: Thought content normal.        Judgment: Judgment normal.    BP 106/81   Pulse 80   Temp 97.9 F (36.6 C) (Temporal)   Resp 20   Ht 6' (1.829 m)   Wt 196 lb (88.9 kg)   SpO2 97%   BMI 26.58 kg/m        Assessment & Plan:  [facility] comes in today with chief complaint of Medical Management of Chronic Issues   Diagnosis and orders addressed:  1. Primary hypertension Low sodium diet - lisinopril-hydrochlorothiazide (ZESTORETIC) 10-12.5 MG tablet; Take 1 tablet by mouth daily.  Dispense: 90 tablet; Refill: 1 - CBC with Differential/Platelet - CMP14+EGFR  2. Mixed hyperlipidemia Low fat diet - simvastatin (ZOCOR) 40 MG tablet; Take 1 tablet (40 mg total) by mouth at bedtime.  Dispense: 90 tablet; Refill: 1 - Lipid panel  3. Incisional hernia, without obstruction or gangrene Report any pain in area  4. Birth mark No changes  5. Benign prostatic hyperplasia without lower urinary tract symptoms Keep follow up with Dr. Jeffie Pollock   6. BMI 28.0-28.9,adult Discussed diet and exercise for person with BMI >25 Will recheck weight in 3-6 months    Labs pending Health Maintenance reviewed Diet and exercise encouraged  Follow up plan: 6 months   Mary-Margaret Hassell Done, FNP

## 2020-10-30 ENCOUNTER — Ambulatory Visit (INDEPENDENT_AMBULATORY_CARE_PROVIDER_SITE_OTHER): Payer: Medicare Other | Admitting: Nurse Practitioner

## 2020-10-30 ENCOUNTER — Encounter: Payer: Self-pay | Admitting: Nurse Practitioner

## 2020-10-30 ENCOUNTER — Other Ambulatory Visit: Payer: Self-pay

## 2020-10-30 VITALS — BP 124/81 | HR 75 | Temp 97.4°F | Ht 72.0 in | Wt 195.4 lb

## 2020-10-30 DIAGNOSIS — L03115 Cellulitis of right lower limb: Secondary | ICD-10-CM | POA: Insufficient documentation

## 2020-10-30 MED ORDER — CEFTRIAXONE SODIUM 1 G IJ SOLR
1.0000 g | Freq: Once | INTRAMUSCULAR | Status: AC
  Administered 2020-10-30: 1 g via INTRAMUSCULAR

## 2020-10-30 MED ORDER — CEPHALEXIN 500 MG PO CAPS: 500.0000 mg | ORAL_CAPSULE | Freq: Two times a day (BID) | ORAL | 0 refills | Status: DC

## 2020-10-30 MED ORDER — CEFTRIAXONE SODIUM 1 G IJ SOLR: 1.0000 g | Freq: Once | INTRAMUSCULAR | 0 refills | Status: DC

## 2020-10-30 NOTE — Assessment & Plan Note (Signed)
Worsening rash over right lower anterior leg in the last few days.  On assessment rash looks more like cellulitis, skin is warm to touch, erythema spreading and no resolution from prior treatments.  1 g Rocephin given in clinic.  Keflex 500 mg tablet twice daily, follow-up in 3 days.  Monitor for fever/signs and symptoms of worsening infection.  Patient knows to seek emergency care with worsening symptoms.  Education provided, printed handouts given, Rx sent to pharmacy.

## 2020-10-30 NOTE — Progress Notes (Signed)
Acute Office Visit  Subjective:    Patient ID: Andre Estrada, male    DOB: 04/10/1940, 80 y.o.   MRN: [id number]  Chief Complaint  Patient presents with   Rash   Cellulitis    HPI Patient is in today for cellulitis. Andre Estrada is a 80 y.o. male who presents with erythema and tenderness.  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 02/23/2020   Benign prostatic hyperplasia without lower urinary tract symptoms 03/24/2018   Birth mark 06/08/2015   BMI 28.0-28.9,adult 11/21/2014   Incisional hernia, without obstruction or gangrene 05/18/2014   Hypertension 07/23/2012   Hyperlipemia 07/23/2012      Past Medical History:  Diagnosis Date   Fracture of thumb    left   Hyperlipidemia    Hypertension    Melanoma ([place])    BACK AND STOMACH    Prostate enlargement    Sinusitis    Tobacco dependence     Past Surgical History:  Procedure Laterality Date   LYMPH NODE BIOPSY     MELANOMA EXCISION     PARTIAL COLECTOMY     SPLENECTOMY, TOTAL      Family History  Problem Relation Age of Onset   Coronary artery disease Father    COPD Father    Prostatitis Brother    Diabetes Son    Prostatitis Brother    Alcohol abuse Brother     Social History   Socioeconomic History   Marital status: Widowed    Spouse name: Not on file   Number of children: 1   Years of education: 9   Highest education level: 9th grade  Occupational History   Occupation: retired  Tobacco Use   Smoking status: Every Day    Packs/day: 1.00    Years: 60.00    Pack years: 60.00    Types: Cigarettes   Smokeless tobacco: Never  Vaping Use   Vaping Use: Never used  Substance  and Sexual Activity   Alcohol use: No   Drug use: No   Sexual activity: Not Currently  Other Topics Concern   Not on file  Social History Narrative   Not on file   Social Determinants of Health   Financial Resource Strain: Not on file  Food Insecurity: Not on file  Transportation Needs: Not on file  Physical Activity: Not on file  Stress: Not on file  Social Connections: Not on file  Intimate Partner Violence: Not on file    Outpatient Medications Prior to Visit  Medication Sig Dispense Refill   aspirin 81 MG tablet Take 81 mg by mouth daily.     finasteride (PROSCAR) 5 MG tablet Take 1 tablet (5 mg total) by mouth daily. 90 tablet 1   hydrocortisone 1 % lotion Apply 1 application topically 2 (two) times daily. 118 mL 1   lisinopril-hydrochlorothiazide (ZESTORETIC) 10-12.5 MG tablet Take 1 tablet by mouth daily. 90 tablet 1   simvastatin (ZOCOR) 40 MG tablet Take 1 tablet (40 mg total) by mouth at bedtime. 90 tablet 1   tamsulosin (FLOMAX) 0.4 MG CAPS capsule Take 1 capsule (0.4 mg total) by mouth daily after supper. 90 capsule 1   triamcinolone ointment (KENALOG) 0.5 %  Apply 1 application topically 2 (two) times daily. 30 g 0   No facility-administered medications prior to visit.    Allergies  Allergen Reactions   Sulfa Antibiotics     Review of Systems  Constitutional: Negative.   HENT: Negative.    Eyes: Negative.   Respiratory:  Negative for cough and shortness of breath.   Gastrointestinal: Negative.   Genitourinary: Negative.   Skin:  Positive for color change and rash.  All other systems reviewed and are negative.     Objective:    Physical Exam Vitals and nursing note reviewed.  Constitutional:      Appearance: Normal appearance.  HENT:     Head: Normocephalic.     Nose: Nose normal.     Mouth/Throat:     Mouth: Mucous membranes are moist.     Pharynx: Oropharynx is clear.  Eyes:     Conjunctiva/sclera: Conjunctivae normal.  Cardiovascular:      Rate and Rhythm: Normal rate and regular rhythm.     Pulses: Normal pulses.     Heart sounds: Normal heart sounds.  Pulmonary:     Effort: Pulmonary effort is normal.     Breath sounds: Normal breath sounds.  Abdominal:     General: Bowel sounds are normal.  Skin:    Findings: Erythema and rash present.          Comments: Worsening red rash  Neurological:     Mental Status: He is alert and oriented to person, place, and time.    BP 124/81   Pulse 75   Temp (!) 97.4 F (36.3 C) (Temporal)   Ht 6' (1.829 m)   Wt 195 lb 6 oz (88.6 kg)   BMI 26.50 kg/m  Wt Readings from Last 3 Encounters:  10/30/20 195 lb 6 oz (88.6 kg)  10/02/20 196 lb (88.9 kg)  07/31/20 208 lb (94.3 kg)    Health Maintenance Due  Topic Date Due   COVID-19 Vaccine (3 - Moderna risk series) 05/26/2019   INFLUENZA VACCINE  09/18/2020    There are no preventive care reminders to display for this patient.   No results found for: TSH Lab Results  Component Value Date   WBC 10.1 10/02/2020   HGB 15.4 10/02/2020   HCT 43.7 10/02/2020   MCV 95 10/02/2020   PLT 126 (L) 10/02/2020   Lab Results  Component Value Date   NA 140 10/02/2020   K 3.9 10/02/2020   CO2 25 10/02/2020   GLUCOSE 108 (H) 10/02/2020   BUN 9 10/02/2020   CREATININE 1.10 10/02/2020   BILITOT 0.9 10/02/2020   ALKPHOS 42 (L) 10/02/2020   AST 22 10/02/2020   ALT 15 10/02/2020   PROT 6.8 10/02/2020   ALBUMIN 4.6 10/02/2020   CALCIUM 9.8 10/02/2020   ANIONGAP 9 06/03/2017   EGFR 69 10/02/2020   Lab Results  Component Value Date   CHOL 137 10/02/2020   Lab Results  Component Value Date   HDL 46 10/02/2020   Lab Results  Component Value Date   LDLCALC 70 10/02/2020   Lab Results  Component Value Date   TRIG 114 10/02/2020   Lab Results  Component Value Date   CHOLHDL 3.0 10/02/2020   Lab Results  Component Value Date   HGBA1C 5.3 01/20/2013       Assessment & Plan:   Problem List Items Addressed This Visit        Other   Cellulitis of right anterior lower leg -  Primary    Worsening rash over right lower anterior leg in the last few days.  On assessment rash looks more like cellulitis, skin is warm to touch, erythema spreading and no resolution from prior treatments.  1 g Rocephin given in clinic.  Keflex 500 mg tablet twice daily, follow-up in 3 days.  Monitor for fever/signs and symptoms of worsening infection.  Patient knows to seek emergency care with worsening symptoms.  Education provided, printed handouts given, Rx sent to pharmacy.      Relevant Medications   cephALEXin (KEFLEX) 500 MG capsule     Meds ordered this encounter  Medications   cephALEXin (KEFLEX) 500 MG capsule    Sig: Take 1 capsule (500 mg total) by mouth 2 (two) times daily.    Dispense:  14 capsule    Refill:  0    Order Specific Question:   Supervising Provider    Answer:   Janora Norlander [[id number]]   cefTRIAXone (ROCEPHIN) 1 g injection    Sig: Inject 1 g into the muscle once for 1 dose.    Dispense:  1 each    Refill:  0      Ivy Lynn, NP

## 2020-10-30 NOTE — Patient Instructions (Signed)
Cellulitis, Adult Cellulitis is a skin infection. The infected area is often warm, red, swollen, and sore. It occurs most often in the arms and lower legs. It is very important to get treated for this condition. What are the causes? This condition is caused by bacteria. The bacteria enter through a break in the skin, such as a cut, burn, insect bite, open sore, or crack. What increases the risk? This condition is more likely to occur in people who: Have a weak body defense system (immune system). Have open cuts, burns, bites, or scrapes on the skin. Are older than 79 years of age. Have a blood sugar problem (diabetes). Have a long-lasting (chronic) liver disease (cirrhosis) or kidney disease. Are very overweight (obese). Have a skin problem, such as: Itchy rash (eczema). Slow movement of blood in the veins (venous stasis). Fluid buildup below the skin (edema). Have been treated with high-energy rays (radiation). Use IV drugs. What are the signs or symptoms? Symptoms of this condition include: Skin that is: Red. Streaking. Spotting. Swollen. Sore or painful when you touch it. Warm. A fever. Chills. Blisters. How is this diagnosed? This condition is diagnosed based on: Medical history. Physical exam. Blood tests. Imaging tests. How is this treated? Treatment for this condition may include: Medicines to treat infections or allergies. Home care, such as: Rest. Placing cold or warm cloths (compresses) on the skin. Hospital care, if the condition is very bad. Follow these instructions at home: Medicines Take over-the-counter and prescription medicines only as told by your doctor. If you were prescribed an antibiotic medicine, take it as told by your doctor. Do not stop taking it even if you start to feel better. General instructions  Drink enough fluid to keep your pee (urine) pale yellow. Do not touch or rub the infected area. Raise (elevate) the infected area above the  level of your heart while you are sitting or lying down. Place cold or warm cloths on the area as told by your doctor. Keep all follow-up visits as told by your doctor. This is important. Contact a doctor if: You have a fever. You do not start to get better after 1-2 days of treatment. Your bone or joint under the infected area starts to hurt after the skin has healed. Your infection comes back. This can happen in the same area or another area. You have a swollen bump in the area. You have new symptoms. You feel ill and have muscle aches and pains. Get help right away if: Your symptoms get worse. You feel very sleepy. You throw up (vomit) or have watery poop (diarrhea) for a long time. You see red streaks coming from the area. Your red area gets larger. Your red area turns dark in color. These symptoms may represent a serious problem that is an emergency. Do not wait to see if the symptoms will go away. Get medical help right away. Call your local emergency services (911 in the [place] Do not drive yourself to the hospital. Summary Cellulitis is a skin infection. The area is often warm, red, swollen, and sore. This condition is treated with medicines, rest, and cold and warm cloths. Take all medicines only as told by your doctor. Tell your doctor if symptoms do not start to get better after 1-2 days of treatment. This information is not intended to replace advice given to you by your health care provider. Make sure you discuss any questions you have with your health care provider. Document Revised: 06/26/2017 Document Reviewed:  06/26/2017 Elsevier Patient Education  2022 [employer].

## 2020-10-30 NOTE — Addendum Note (Signed)
Addended by: Milas Hock on: 10/30/2020 11:29 AM   Modules accepted: Orders

## 2020-11-02 ENCOUNTER — Ambulatory Visit: Payer: Medicare Other | Admitting: Urology

## 2020-11-02 ENCOUNTER — Other Ambulatory Visit: Payer: Self-pay

## 2020-11-02 VITALS — BP 116/74 | HR 75

## 2020-11-02 DIAGNOSIS — N138 Other obstructive and reflux uropathy: Secondary | ICD-10-CM | POA: Diagnosis not present

## 2020-11-02 DIAGNOSIS — N401 Enlarged prostate with lower urinary tract symptoms: Secondary | ICD-10-CM | POA: Diagnosis not present

## 2020-11-02 DIAGNOSIS — C61 Malignant neoplasm of prostate: Secondary | ICD-10-CM | POA: Diagnosis not present

## 2020-11-02 DIAGNOSIS — R351 Nocturia: Secondary | ICD-10-CM

## 2020-11-02 DIAGNOSIS — R350 Frequency of micturition: Secondary | ICD-10-CM

## 2020-11-02 DIAGNOSIS — N400 Enlarged prostate without lower urinary tract symptoms: Secondary | ICD-10-CM

## 2020-11-02 LAB — URINALYSIS, ROUTINE W REFLEX MICROSCOPIC
Bilirubin, UA: NEGATIVE
Glucose, UA: NEGATIVE
Ketones, UA: NEGATIVE
Nitrite, UA: NEGATIVE
Protein,UA: NEGATIVE
Specific Gravity, UA: 1.01 (ref 1.005–1.030)
Urobilinogen, Ur: 0.2 mg/dL (ref 0.2–1.0)
pH, UA: 7 (ref 5.0–7.5)

## 2020-11-02 LAB — MICROSCOPIC EXAMINATION
Bacteria, UA: NONE SEEN
Epithelial Cells (non renal): NONE SEEN /hpf (ref 0–10)
Renal Epithel, UA: NONE SEEN /hpf

## 2020-11-02 MED ORDER — TAMSULOSIN HCL 0.4 MG PO CAPS: 0.4000 mg | ORAL_CAPSULE | Freq: Every day | ORAL | 3 refills | Status: DC

## 2020-11-02 MED ORDER — FINASTERIDE 5 MG PO TABS: 5.0000 mg | ORAL_TABLET | Freq: Every day | ORAL | 3 refills | Status: DC

## 2020-11-02 NOTE — Progress Notes (Signed)

## 2020-11-02 NOTE — Progress Notes (Signed)
Subjective:  1. Prostate cancer ([place])   2. BPH with urinary obstruction   3. Nocturia   4. Urinary frequency   5. Benign prostatic hyperplasia without lower urinary tract symptoms       He has T2a Gleason 6 prostate cancer found in a single core in the right apex with 5% involvement on biopsy in 4/16. His PSA was 2.8 with a 26% f/t ratio in 9/21 after rising to 4.1 from 3.8 but it has been variable in the past and was 4.4 in 8/19.   He remains on finasteride.  He has no associated signs or symptoms.   He had a right base nodule and a prior negative biopsy in 2014. His PSA on 7/1 was 9.4 with 25% f/t ratio and it was 8.15 prior to the biopsy but it was down to 4.6 with a 38% f/t ratio on a PSA from October 2016. Most recent PSA was 5.3 08/05/16 which was a decline from 7.7 in 12/17. He has not had a repeat biopsy     CC: I have an enlarged prostate (follow-up).  11/05/19: Andre Estrada remains on finasteride and tamsulosin with an IPSS of 1.  He has no hesitancy. He is content with his symptoms. He has had no gross hematuria.   He had retention on 05/22/17 and was started on finasteride after that.   11/02/20: Andre Estrada returns today in f/u for the history above.   His last PSA in 3/22 was stable at 3.6.  His IPSS is 4 with some intermittnecy.   He has no associated signs or symptoms.  UA has mild pyuria.       IPSS     Row Name 11/02/20 0900         International Prostate Symptom Score   How often have you had the sensation of not emptying your bladder? Not at All     How often have you had to urinate less than every two hours? Not at All     How often have you found you stopped and started again several times when you urinated? About half the time     How often have you found it difficult to postpone urination? Not at All     How often have you had a weak urinary stream? Not at All     How often have you had to strain to start urination? Not at All     How many times did you typically  get up at night to urinate? 1 Time     Total IPSS Score 4                ROS:  ROS:  A complete review of systems was performed.  All systems are negative except for pertinent findings as noted.   Review of Systems  All other systems reviewed and are negative.  Allergies  Allergen Reactions   Sulfa Antibiotics     Outpatient Encounter Medications as of 11/02/2020  Medication Sig   aspirin 81 MG tablet Take 81 mg by mouth daily.   cephALEXin (KEFLEX) 500 MG capsule Take 1 capsule (500 mg total) by mouth 2 (two) times daily.   hydrocortisone 1 % lotion Apply 1 application topically 2 (two) times daily.   lisinopril-hydrochlorothiazide (ZESTORETIC) 10-12.5 MG tablet Take 1 tablet by mouth daily.   simvastatin (ZOCOR) 40 MG tablet Take 1 tablet (40 mg total) by mouth at bedtime.   triamcinolone ointment (KENALOG) 0.5 % Apply 1 application topically  2 (two) times daily.   [DISCONTINUED] finasteride (PROSCAR) 5 MG tablet Take 1 tablet (5 mg total) by mouth daily.   [DISCONTINUED] tamsulosin (FLOMAX) 0.4 MG CAPS capsule Take 1 capsule (0.4 mg total) by mouth daily after supper.   finasteride (PROSCAR) 5 MG tablet Take 1 tablet (5 mg total) by mouth daily.   tamsulosin (FLOMAX) 0.4 MG CAPS capsule Take 1 capsule (0.4 mg total) by mouth daily after supper.   No facility-administered encounter medications on file as of 11/02/2020.    Past Medical History:  Diagnosis Date   Fracture of thumb    left   Hyperlipidemia    Hypertension    Melanoma ([place])    BACK AND STOMACH    Prostate enlargement    Sinusitis    Tobacco dependence     Past Surgical History:  Procedure Laterality Date   LYMPH NODE BIOPSY     MELANOMA EXCISION     PARTIAL COLECTOMY     SPLENECTOMY, TOTAL      Social History   Socioeconomic History   Marital status: Widowed    Spouse name: Not on file   Number of children: 1   Years of education: 9   Highest education level: 9th grade  Occupational  History   Occupation: retired  Tobacco Use   Smoking status: Every Day    Packs/day: 1.00    Years: 60.00    Pack years: 60.00    Types: Cigarettes   Smokeless tobacco: Never  Vaping Use   Vaping Use: Never used  Substance and Sexual Activity   Alcohol use: No   Drug use: No   Sexual activity: Not Currently  Other Topics Concern   Not on file  Social History Narrative   Not on file   Social Determinants of Health   Financial Resource Strain: Not on file  Food Insecurity: Not on file  Transportation Needs: Not on file  Physical Activity: Not on file  Stress: Not on file  Social Connections: Not on file  Intimate Partner Violence: Not on file    Family History  Problem Relation Age of Onset   Coronary artery disease Father    COPD Father    Prostatitis Brother    Diabetes Son    Prostatitis Brother    Alcohol abuse Brother        Objective: BP 116/74   Pulse 75     Physical Exam  Lab Results:  Results for orders placed or performed in visit on 11/02/20 (from the past 24 hour(s))  Urinalysis, Routine w reflex microscopic     Status: Abnormal   Collection Time: 11/02/20  8:57 AM  Result Value Ref Range   Specific Gravity, UA 1.010 1.005 - 1.030   pH, UA 7.0 5.0 - 7.5   Color, UA Yellow Yellow   Appearance Ur Clear Clear   Leukocytes,UA Trace (A) Negative   Protein,UA Negative Negative/Trace   Glucose, UA Negative Negative   Ketones, UA Negative Negative   RBC, UA Trace (A) Negative   Bilirubin, UA Negative Negative   Urobilinogen, Ur 0.2 0.2 - 1.0 mg/dL   Nitrite, UA Negative Negative   Microscopic Examination See below:    Narrative   Performed at:  [address] [facility] [place] [address], [place], [place]  [id number] Lab Director: Mina Marble MT, Phone:  [phone]  Microscopic Examination     Status: Abnormal   Collection Time: 11/02/20  8:57 AM   Urine  Result Value  Ref Range   WBC, UA 6-10 (A) 0 - 5 /hpf   RBC 0-2 0 - 2 /hpf    Epithelial Cells (non renal) None seen 0 - 10 /hpf   Renal Epithel, UA None seen None seen /hpf   Mucus, UA Present Not Estab.   Bacteria, UA None seen None seen/Few   Narrative   Performed at:  [facility] [address], [place], [place]  [id number] Lab Director: Mina Marble MT, Phone:  [phone]     Lab Results  Component Value Date   PSA1 3.6 05/04/2020   PSA1 2.8 09/30/2019   PSA1 4.1 (H) 04/01/2019    BMET No results for input(s): NA, K, CL, CO2, GLUCOSE, BUN, CREATININE, CALCIUM in the last 72 hours.   Prior records and IPSS reviewed.    Studies/Results: No results found.  Recent Results (from the past 2160 hour(s))  CBC with Differential/Platelet     Status: Abnormal   Collection Time: 10/02/20  9:24 AM  Result Value Ref Range   WBC 10.1 3.4 - 10.8 x10E3/uL   RBC 4.62 4.14 - 5.80 x10E6/uL   Hemoglobin 15.4 13.0 - 17.7 g/dL   Hematocrit 43.7 37.5 - 51.0 %   MCV 95 79 - 97 fL   MCH 33.3 (H) 26.6 - 33.0 pg   MCHC 35.2 31.5 - 35.7 g/dL   RDW 12.0 11.6 - 15.4 %   Platelets 126 (L) 150 - 450 x10E3/uL   Neutrophils 55 Not Estab. %   Lymphs 28 Not Estab. %   Monocytes 13 Not Estab. %   Eos 3 Not Estab. %   Basos 1 Not Estab. %   Neutrophils Absolute 5.6 1.4 - 7.0 x10E3/uL   Lymphocytes Absolute 2.8 0.7 - 3.1 x10E3/uL   Monocytes Absolute 1.3 (H) 0.1 - 0.9 x10E3/uL   EOS (ABSOLUTE) 0.3 0.0 - 0.4 x10E3/uL   Basophils Absolute 0.1 0.0 - 0.2 x10E3/uL   Immature Granulocytes 0 Not Estab. %   Immature Grans (Abs) 0.0 0.0 - 0.1 x10E3/uL  CMP14+EGFR     Status: Abnormal   Collection Time: 10/02/20  9:24 AM  Result Value Ref Range   Glucose 108 (H) 65 - 99 mg/dL   BUN 9 8 - 27 mg/dL   Creatinine, Ser 1.10 0.76 - 1.27 mg/dL   eGFR 69 >59 mL/min/1.73   BUN/Creatinine Ratio 8 (L) 10 - 24   Sodium 140 134 - 144 mmol/L   Potassium 3.9 3.5 - 5.2 mmol/L   Chloride 101 96 - 106 mmol/L   CO2 25 20 - 29 mmol/L   Calcium 9.8 8.6 - 10.2 mg/dL   Total  Protein 6.8 6.0 - 8.5 g/dL   Albumin 4.6 3.7 - 4.7 g/dL   Globulin, Total 2.2 1.5 - 4.5 g/dL   Albumin/Globulin Ratio 2.1 1.2 - 2.2   Bilirubin Total 0.9 0.0 - 1.2 mg/dL   Alkaline Phosphatase 42 (L) 44 - 121 IU/L   AST 22 0 - 40 IU/L   ALT 15 0 - 44 IU/L  Lipid panel     Status: None   Collection Time: 10/02/20  9:24 AM  Result Value Ref Range   Cholesterol, Total 137 100 - 199 mg/dL   Triglycerides 114 0 - 149 mg/dL   HDL 46 >39 mg/dL   VLDL Cholesterol Cal 21 5 - 40 mg/dL   LDL Chol Calc (NIH) 70 0 - 99 mg/dL   Chol/HDL Ratio 3.0 0.0 - 5.0 ratio  Comment:                                   T. Chol/HDL Ratio                                             Men  Women                               1/2 Avg.Risk  3.4    3.3                                   Avg.Risk  5.0    4.4                                2X Avg.Risk  9.6    7.1                                3X Avg.Risk 23.4   11.0   Urinalysis, Routine w reflex microscopic     Status: Abnormal   Collection Time: 11/02/20  8:57 AM  Result Value Ref Range   Specific Gravity, UA 1.010 1.005 - 1.030   pH, UA 7.0 5.0 - 7.5   Color, UA Yellow Yellow   Appearance Ur Clear Clear   Leukocytes,UA Trace (A) Negative   Protein,UA Negative Negative/Trace   Glucose, UA Negative Negative   Ketones, UA Negative Negative   RBC, UA Trace (A) Negative   Bilirubin, UA Negative Negative   Urobilinogen, Ur 0.2 0.2 - 1.0 mg/dL   Nitrite, UA Negative Negative   Microscopic Examination See below:   Microscopic Examination     Status: Abnormal   Collection Time: 11/02/20  8:57 AM   Urine  Result Value Ref Range   WBC, UA 6-10 (A) 0 - 5 /hpf   RBC 0-2 0 - 2 /hpf   Epithelial Cells (non renal) None seen 0 - 10 /hpf   Renal Epithel, UA None seen None seen /hpf   Mucus, UA Present Not Estab.   Bacteria, UA None seen None seen/Few     Assessment & Plan: Prostate cancer on surveillance.   PSA has been suppressed with  finasteride.   He will  continue that medication.   I will get a PSA today and in 6 months which he returns for an exam.   BPH with BOO and frequency.  He is doing well on tamsulosin and finasteride which I refilled.     Meds ordered this encounter  Medications   finasteride (PROSCAR) 5 MG tablet    Sig: Take 1 tablet (5 mg total) by mouth daily.    Dispense:  90 tablet    Refill:  3   tamsulosin (FLOMAX) 0.4 MG CAPS capsule    Sig: Take 1 capsule (0.4 mg total) by mouth daily after supper.    Dispense:  90 capsule    Refill:  3      Orders Placed This Encounter  Procedures   Microscopic Examination   Urinalysis, Routine w reflex microscopic  PSA   PSA    Standing Status:   Future    Standing Expiration Date:   11/02/2021      Return in about 6 months (around 05/02/2021) for with PSA.   CC: Chevis Pretty, FNP      Irine Seal 11/02/2020 Patient ID: Andre Estrada, male   DOB: [birth year], 80 y.o.   MRN: [id number]

## 2020-11-03 ENCOUNTER — Ambulatory Visit (INDEPENDENT_AMBULATORY_CARE_PROVIDER_SITE_OTHER): Payer: Medicare Other | Admitting: Nurse Practitioner

## 2020-11-03 ENCOUNTER — Encounter: Payer: Self-pay | Admitting: Nurse Practitioner

## 2020-11-03 VITALS — BP 110/66 | HR 80 | Temp 97.5°F | Resp 20 | Ht 72.0 in | Wt 195.0 lb

## 2020-11-03 DIAGNOSIS — L03115 Cellulitis of right lower limb: Secondary | ICD-10-CM

## 2020-11-03 DIAGNOSIS — L2084 Intrinsic (allergic) eczema: Secondary | ICD-10-CM

## 2020-11-03 LAB — PSA: Prostate Specific Ag, Serum: 3.5 ng/mL (ref 0.0–4.0)

## 2020-11-03 MED ORDER — TRIAMCINOLONE ACETONIDE 0.1 % EX CREA
1.0000 | TOPICAL_CREAM | Freq: Two times a day (BID) | CUTANEOUS | 1 refills | Status: AC
Start: 2020-11-03 — End: ?

## 2020-11-03 NOTE — Progress Notes (Signed)
Results printed and mailed.   

## 2020-11-03 NOTE — Patient Instructions (Signed)
Atopic Dermatitis Atopic dermatitis is a skin disorder that causes inflammation of the skin. It is marked by a red rash and itchy, dry, scaly skin. It is the most common type of eczema. Eczema is a group of skin conditions that cause the skin to become rough and swollen. This condition is generally worse during the cooler wintermonths and often improves during the warm summer months. Atopic dermatitis usually starts showing signs in infancy and can last through adulthood. This condition cannot be passed from one person to another (is not contagious). Atopic dermatitis may not always be present, but when it is, it is called aflare-up. What are the causes? The exact cause of this condition is not known. Flare-ups may be triggered by: Coming in contact with something that you are sensitive or allergic to (allergen). Stress. Certain foods. Extremely hot or cold weather. Harsh chemicals and soaps. Dry air. Chlorine. What increases the risk? This condition is more likely to develop in people who have a personal or family history of: Eczema. Allergies. Asthma. Hay fever. What are the signs or symptoms? Symptoms of this condition include: Dry, scaly skin. Red, itchy rash. Itchiness, which can be severe. This may occur before the skin rash. This can make sleeping difficult. Skin thickening and cracking that can occur over time. How is this diagnosed? This condition is diagnosed based on: Your symptoms. Your medical history. A physical exam. How is this treated? There is no cure for this condition, but symptoms can usually be controlled. Treatment focuses on: Controlling the itchiness and scratching. You may be given medicines, such as antihistamines or steroid creams. Limiting exposure to allergens. Recognizing situations that cause stress and developing a plan to manage stress. If your atopic dermatitis does not get better with medicines, or if it is all over your body (widespread), a  treatment using a specific type of light (phototherapy) may be used. Follow these instructions at home: Skin care  Keep your skin well moisturized. Doing this seals in moisture and helps to prevent dryness. Use unscented lotions that have petroleum in them. Avoid lotions that contain alcohol or water. They can dry the skin. Keep baths or showers short (less than 5 minutes) in warm water. Do not use hot water. Use mild, unscented cleansers for bathing. Avoid soap and bubble bath. Apply a moisturizer to your skin right after a bath or shower. Do not apply anything to your skin without checking with your health care provider.  General instructions Take or apply over-the-counter and prescription medicines only as told by your health care provider. Dress in clothes made of cotton or cotton blends. Dress lightly because heat increases itchiness. When washing your clothes, rinse your clothes twice so all of the soap is removed. Avoid any triggers that can cause a flare-up. Keep your fingernails cut short. Avoid scratching. Scratching makes the rash and itchiness worse. A break in the skin from scratching could result in a skin infection (impetigo). Do not be around people who have cold sores or fever blisters. If you get the infection, it may cause your atopic dermatitis to worsen. Keep all follow-up visits. This is important. Contact a health care provider if: Your itchiness interferes with sleep. Your rash gets worse or is not better within one week of starting treatment. You have a fever. You have a rash flare-up after having contact with someone who has cold sores or fever blisters. Get help right away if: You develop pus or soft yellow scabs in the rash area.   Summary Atopic dermatitis causes a red rash and itchy, dry, scaly skin. Treatment focuses on controlling the itchiness and scratching, limiting exposure to things that you are sensitive or allergic to (allergens), recognizing  situations that cause stress, and developing a plan to manage stress. Keep your skin well moisturized. Keep baths or showers shorter than 5 minutes and use warm water. Do not use hot water. This information is not intended to replace advice given to you by your health care provider. Make sure you discuss any questions you have with your healthcare provider. Document Revised: 11/15/2019 Document Reviewed: 11/15/2019 Elsevier Patient Education  2022 [employer]  

## 2020-11-03 NOTE — Progress Notes (Signed)
   Subjective:    Patient ID: Andre Estrada, male    DOB: [birth year], 80 y.o.   MRN: [id number]  Chief Complaint: Recheck cellulits   HPI Patient was seen on 10/30/20 and was dx with right lower ext cellulitis. He was given 1gm of rocephin and was started on keflex '500mg'$  bid. He is here today for recheck. Redness is better but skin is still red and scaley.  BP Readings from Last 3 Encounters:  11/03/20 110/66  11/02/20 116/74  10/30/20 124/81     Review of Systems  Constitutional:  Negative for diaphoresis.  Eyes:  Negative for pain.  Respiratory:  Negative for shortness of breath.   Cardiovascular:  Negative for chest pain, palpitations and leg swelling.  Gastrointestinal:  Negative for abdominal pain.  Endocrine: Negative for polydipsia.  Skin:  Negative for rash.  Neurological:  Negative for dizziness, weakness and headaches.  Hematological:  Does not bruise/bleed easily.  All other systems reviewed and are negative.     Objective:   Physical Exam Vitals and nursing note reviewed.  Constitutional:      Appearance: Normal appearance.  Cardiovascular:     Rate and Rhythm: Normal rate and regular rhythm.     Heart sounds: Normal heart sounds.  Pulmonary:     Breath sounds: Normal breath sounds.  Skin:    Findings: Rash (eruthematous scaley rash on right lower ext.) present.  Neurological:     Mental Status: He is alert and oriented to person, place, and time.    BP 110/66   Pulse 80   Temp (!) 97.5 F (36.4 C) (Temporal)   Resp 20   Ht 6' (1.829 m)   Wt 195 lb (88.5 kg)   SpO2 97%   BMI 26.45 kg/m        Assessment & Plan:   [facility] in today with chief complaint of Recheck cellulits   1. Cellulitis of right anterior lower leg Finish antibiotics Previous note reviewed  2. Intrinsic atopic dermatitis Mix a small amount of triamcinolone with eucerin and apply to leg BID Avoid scratching - triamcinolone cream (KENALOG) 0.1 %; Apply 1  application topically 2 (two) times daily.  Dispense: 453.6 g; Refill: 1    The above assessment and management plan was discussed with the patient. The patient verbalized understanding of and has agreed to the management plan. Patient is aware to call the clinic if symptoms persist or worsen. Patient is aware when to return to the clinic for a follow-up visit. Patient educated on when it is appropriate to go to the emergency department.   Mary-Margaret Hassell Done, FNP

## 2020-11-23 ENCOUNTER — Telehealth: Payer: Self-pay | Admitting: Nurse Practitioner

## 2020-11-23 NOTE — Telephone Encounter (Signed)
Opened in Error.

## 2020-12-05 ENCOUNTER — Telehealth: Payer: Self-pay | Admitting: Nurse Practitioner

## 2020-12-05 NOTE — Telephone Encounter (Signed)
LVM FOR PT TO RTN MY CALL TO SCHEDULE AWV WITH [facility]

## 2021-01-18 ENCOUNTER — Telehealth: Payer: Self-pay | Admitting: Nurse Practitioner

## 2021-01-18 NOTE — Telephone Encounter (Signed)
Left message for patient to call back and schedule Medicare Annual Wellness Visit (AWV) to be completed by video or phone.   Last AWV: : 04/14/2019  Please schedule at anytime with [name]  45 minute appointment  Any questions, please contact me at [phone]

## 2021-03-02 ENCOUNTER — Telehealth: Payer: Self-pay | Admitting: Nurse Practitioner

## 2021-03-02 NOTE — Telephone Encounter (Signed)
Left message for patient to call back and schedule Medicare Annual Wellness Visit (AWV) to be completed by video or phone.   Last AWV: 04/14/2019  Please schedule at anytime with Devon  45 minute appointment  Any questions, please contact me at [phone]

## 2021-03-08 ENCOUNTER — Ambulatory Visit (INDEPENDENT_AMBULATORY_CARE_PROVIDER_SITE_OTHER): Payer: Medicare HMO

## 2021-03-08 VITALS — Ht 72.0 in | Wt 195.0 lb

## 2021-03-08 DIAGNOSIS — Z0000 Encounter for general adult medical examination without abnormal findings: Secondary | ICD-10-CM | POA: Diagnosis not present

## 2021-03-08 NOTE — Progress Notes (Signed)
Subjective:   Andre Estrada is a 81 y.o. male who presents for Medicare Annual/Subsequent preventive examination.  Virtual Visit via Telephone Note  I connected with  [facility] on 03/08/21 at 11:15 AM EST by telephone and verified that I am speaking with the correct person using two identifiers.  Location: Patient: Home Provider: [facility] Persons participating in the virtual visit: patient/Nurse Health Advisor   I discussed the limitations, risks, security and privacy concerns of performing an evaluation and management service by telephone and the availability of in person appointments. The patient expressed understanding and agreed to proceed.  Interactive audio and video telecommunications were attempted between this nurse and patient, however failed, due to patient having technical difficulties OR patient did not have access to video capability.  We continued and completed visit with audio only.  Some vital signs may be absent or patient reported.   Latifah Padin E Dafne Nield, LPN   Review of Systems     Cardiac Risk Factors include: advanced age (>17men, >83 women);male gender;dyslipidemia;hypertension;smoking/ tobacco exposure     Objective:    Today's Vitals   03/08/21 1119  Weight: 195 lb (88.5 kg)  Height: 6' (1.829 m)   Body mass index is 26.45 kg/m.  Advanced Directives 04/14/2019 10/02/2017 06/06/2017 02/14/2014  Does Patient Have a Medical Advance Directive? No No;Yes No Yes  Type of Advance Directive - Living will - -  Copy of [place] in Chart? - - - No - copy requested  Would patient like information on creating a medical advance directive? No - Patient declined - No - Patient declined -    Current Medications (verified) Outpatient Encounter Medications as of 03/08/2021  Medication Sig   aspirin 81 MG tablet Take 81 mg by mouth daily.   finasteride (PROSCAR) 5 MG tablet Take 1 tablet (5 mg total) by mouth daily.   lisinopril-hydrochlorothiazide  (ZESTORETIC) 10-12.5 MG tablet Take 1 tablet by mouth daily.   simvastatin (ZOCOR) 40 MG tablet Take 1 tablet (40 mg total) by mouth at bedtime.   tamsulosin (FLOMAX) 0.4 MG CAPS capsule Take 1 capsule (0.4 mg total) by mouth daily after supper.   cephALEXin (KEFLEX) 500 MG capsule Take 1 capsule (500 mg total) by mouth 2 (two) times daily. (Patient not taking: Reported on 03/08/2021)   hydrocortisone 1 % lotion Apply 1 application topically 2 (two) times daily. (Patient not taking: Reported on 03/08/2021)   triamcinolone cream (KENALOG) 0.1 % Apply 1 application topically 2 (two) times daily.   No facility-administered encounter medications on file as of 03/08/2021.    Allergies (verified) Sulfa antibiotics   History: Past Medical History:  Diagnosis Date   Fracture of thumb    left   Hyperlipidemia    Hypertension    Melanoma ([place])    BACK AND STOMACH    Prostate enlargement    Sinusitis    Tobacco dependence    Past Surgical History:  Procedure Laterality Date   LYMPH NODE BIOPSY     MELANOMA EXCISION     PARTIAL COLECTOMY     SPLENECTOMY, TOTAL     Family History  Problem Relation Age of Onset   Coronary artery disease Father    COPD Father    Prostatitis Brother    Diabetes Son    Prostatitis Brother    Alcohol abuse Brother    Social History   Socioeconomic History   Marital status: Widowed    Spouse name: Not on file   Number  of children: 1   Years of education: 9   Highest education level: 9th grade  Occupational History   Occupation: retired  Tobacco Use   Smoking status: Every Day    Packs/day: 1.00    Years: 60.00    Pack years: 60.00    Types: Cigarettes   Smokeless tobacco: Never  Vaping Use   Vaping Use: Never used  Substance and Sexual Activity   Alcohol use: No   Drug use: No   Sexual activity: Not Currently  Other Topics Concern   Not on file  Social History Narrative   Son lives with him   He's still very independent   Social  Determinants of Health   Financial Resource Strain: Medium Risk   Difficulty of Paying Living Expenses: Somewhat hard  Food Insecurity: No Food Insecurity   Worried About Charity fundraiser in the Last Year: Never true   Ran Out of Food in the Last Year: Never true  Transportation Needs: No Transportation Needs   Lack of Transportation (Medical): No   Lack of Transportation (Non-Medical): No  Physical Activity: Insufficiently Active   Days of Exercise per Week: 7 days   Minutes of Exercise per Session: 20 min  Stress: No Stress Concern Present   Feeling of Stress : Not at all  Social Connections: Socially Isolated   Frequency of Communication with Friends and Family: More than three times a week   Frequency of Social Gatherings with Friends and Family: More than three times a week   Attends Religious Services: Never   Marine scientist or Organizations: No   Attends Archivist Meetings: Never   Marital Status: Widowed    Tobacco Counseling Ready to quit: Not Answered Counseling given: Not Answered   Clinical Intake:  Pre-visit preparation completed: Yes  Pain : No/denies pain     BMI - recorded: 26.45 Nutritional Status: BMI 25 -29 Overweight Nutritional Risks: None Diabetes: No  How often do you need to have someone help you when you read instructions, pamphlets, or other written materials from your doctor or pharmacy?: 1 - Never  Diabetic? no  Interpreter Needed?: No  Information entered by :: Emannuel Vise, LPN   Activities of Daily Living In your present state of health, do you have any difficulty performing the following activities: 03/08/2021 11/03/2020  Hearing? N N  Vision? N N  Difficulty concentrating or making decisions? Y N  Walking or climbing stairs? N N  Dressing or bathing? N N  Doing errands, shopping? N N  Preparing Food and eating ? N -  Using the Toilet? N -  In the past six months, have you accidently leaked urine? N -  Do  you have problems with loss of bowel control? N -  Managing your Medications? N -  Managing your Finances? N -  Housekeeping or managing your Housekeeping? N -  Some recent data might be hidden    Patient Care Team: Chevis Pretty, FNP as PCP - General (Nurse Practitioner)  Indicate any recent Medical Services you may have received from other than [facility] providers in the past year (date may be approximate).     Assessment:   This is a routine wellness examination for Jaman.  Hearing/Vision screen Hearing Screening - Comments:: Denies hearing difficulties  Vision Screening - Comments:: Wears old rx glasses - hasn't seen eye doctor in about 10 years  Dietary issues and exercise activities discussed: Current Exercise Habits: Home exercise routine, Type  of exercise: walking, Time (Minutes): 20, Frequency (Times/Week): 6, Weekly Exercise (Minutes/Week): 120, Intensity: Mild, Exercise limited by: None identified   Goals Addressed             This Visit's Progress    DIET - INCREASE WATER INTAKE   On track    Try to drink 6-8 glasses of water daily     Exercise 3x per week (30 min per time)   On track      Depression Screen PHQ 2/9 Scores 03/08/2021 11/03/2020 10/30/2020 10/02/2020 07/31/2020 04/03/2020 02/23/2020  PHQ - 2 Score 0 0 0 0 0 0 0  PHQ- 9 Score - - - 0 - - -    Fall Risk Fall Risk  03/08/2021 11/03/2020 10/30/2020 10/02/2020 04/03/2020  Falls in the past year? 0 0 0 0 0  Number falls in past yr: 0 - - - -  Injury with Fall? 0 - - - -  Risk for fall due to : Mental status change - - - -  Follow up Falls prevention discussed - - - -    FALL RISK PREVENTION PERTAINING TO THE HOME:  Any stairs in or around the home? No  If so, are there any without handrails? No  Home free of loose throw rugs in walkways, pet beds, electrical cords, etc? Yes  Adequate lighting in your home to reduce risk of falls? Yes   ASSISTIVE DEVICES UTILIZED TO PREVENT FALLS:  Life alert?  No  Use of a cane, walker or w/c? No  Grab bars in the bathroom? Yes  Shower chair or bench in shower? Yes  Elevated toilet seat or a handicapped toilet? No   TIMED UP AND GO:  Was the test performed? No . Telephonic visit  Cognitive Function: MMSE - Mini Mental State Exam 10/02/2017  Orientation to time 5  Orientation to Place 5  Registration 3  Attention/ Calculation 4  Recall 2  Language- name 2 objects 2  Language- repeat 1  Language- follow 3 step command 3  Language- read & follow direction 1  Write a sentence 1  Copy design 1  Total score 28     6CIT Screen 03/08/2021 04/14/2019  What Year? 0 points 0 points  What month? 0 points 0 points  What time? 0 points 0 points  Count back from 20 0 points 0 points  Months in reverse 4 points 4 points  Repeat phrase 6 points 4 points  Total Score 10 8    Immunizations Immunization History  Administered Date(s) Administered   Fluad Quad(high Dose 65+) 11/03/2018   Influenza, High Dose Seasonal PF 10/14/2014, 10/29/2016   Influenza,inj,Quad PF,6+ Mos 11/17/2013, 12/02/2016   Influenza-Unspecified 10/07/2012, 12/17/2017, 10/25/2020   Moderna Sars-Covid-2 Vaccination 03/31/2019, 04/28/2019, 11/14/2020   Pneumococcal Conjugate-13 05/18/2014   Pneumococcal Polysaccharide-23 02/18/2010, 10/29/2016    TDAP status: Due, Education has been provided regarding the importance of this vaccine. Advised may receive this vaccine at local pharmacy or Health Dept. Aware to provide a copy of the vaccination record if obtained from local pharmacy or Health Dept. Verbalized acceptance and understanding.  Flu Vaccine status: Up to date  Pneumococcal vaccine status: Up to date  Covid-19 vaccine status: Completed vaccines  Qualifies for Shingles Vaccine? Yes   Zostavax completed No   Shingrix Completed?: No.    Education has been provided regarding the importance of this vaccine. Patient has been advised to call insurance company to  determine out of pocket expense if they have  not yet received this vaccine. Advised may also receive vaccine at local pharmacy or Health Dept. Verbalized acceptance and understanding.  Screening Tests Health Maintenance  Topic Date Due   Zoster Vaccines- Shingrix (1 of 2) Never done   COVID-19 Vaccine (4 - Booster for Moderna series) 01/09/2021   Hepatitis C Screening  04/03/2021 (Originally 12/20/1958)   TETANUS/TDAP  10/02/2021 (Originally 12/19/1959)   Pneumonia Vaccine 13+ Years old  Completed   INFLUENZA VACCINE  Completed   HPV VACCINES  Aged Out    Health Maintenance  Health Maintenance Due  Topic Date Due   Zoster Vaccines- Shingrix (1 of 2) Never done   COVID-19 Vaccine (4 - Booster for Moderna series) 01/09/2021    Colorectal cancer screening: No longer required.   Lung Cancer Screening: (Low Dose CT Chest recommended if Age 29-80 years, 30 pack-year currently smoking OR have quit w/in 15years.) does not qualify  Additional Screening:  Hepatitis C Screening: does not qualify  Vision Screening: Recommended annual ophthalmology exams for early detection of glaucoma and other disorders of the eye. Is the patient up to date with their annual eye exam?  No  Who is the provider or what is the name of the office in which the patient attends annual eye exams? none If pt is not established with a provider, would they like to be referred to a provider to establish care? No .   Dental Screening: Recommended annual dental exams for proper oral hygiene  Community Resource Referral / Chronic Care Management: CRR required this visit?  No   CCM required this visit?  No      Plan:     I have personally reviewed and noted the following in the patients chart:   Medical and social history Use of alcohol, tobacco or illicit drugs  Current medications and supplements including opioid prescriptions. Patient is not currently taking opioid prescriptions. Functional ability and  status Nutritional status Physical activity Advanced directives List of other physicians Hospitalizations, surgeries, and ER visits in previous 12 months Vitals Screenings to include cognitive, depression, and falls Referrals and appointments  In addition, I have reviewed and discussed with patient certain preventive protocols, quality metrics, and best practice recommendations. A written personalized care plan for preventive services as well as general preventive health recommendations were provided to patient.     Sandrea Hammond, LPN   [id number]/[id number]   Nurse Notes: wants to make sure that we check his PSA every 6 months when he has his routine labs here (if we don't, he has to have blood drawn again when he sees Urologist)  6CIT = 10

## 2021-03-08 NOTE — Patient Instructions (Signed)
Andre Estrada , Thank you for taking time to come for your Medicare Wellness Visit. I appreciate your ongoing commitment to your health goals. Please review the following plan we discussed and let me know if I can assist you in the future.   Screening recommendations/referrals: Colonoscopy: no longer required Recommended yearly ophthalmology/optometry visit for glaucoma screening and checkup Recommended yearly dental visit for hygiene and checkup  Vaccinations: Influenza vaccine: Done 10/25/2020 - Repeat every year Pneumococcal vaccine: Done 05/18/2014 & 10/29/2016 Tdap vaccine: Due Shingles vaccine: Due   Covid-19: Done 03/31/2019, 04/28/2019, & 11/14/2020  Advanced directives: Please bring a copy of your health care power of attorney and living will to the office to be added to your chart at your convenience.   Conditions/risks identified: Aim for 30 minutes of exercise or brisk walking each day, drink 6-8 glasses of water and eat lots of fruits and vegetables.   Next appointment: Follow up in one year for your annual wellness visit.   Preventive Care 96 Years and Older, Male  Preventive care refers to lifestyle choices and visits with your health care provider that can promote health and wellness. What does preventive care include? A yearly physical exam. This is also called an annual well check. Dental exams once or twice a year. Routine eye exams. Ask your health care provider how often you should have your eyes checked. Personal lifestyle choices, including: Daily care of your teeth and gums. Regular physical activity. Eating a healthy diet. Avoiding tobacco and drug use. Limiting alcohol use. Practicing safe sex. Taking low doses of aspirin every day. Taking vitamin and mineral supplements as recommended by your health care provider. What happens during an annual well check? The services and screenings done by your health care provider during your annual well check will depend on  your age, overall health, lifestyle risk factors, and family history of disease. Counseling  Your health care provider may ask you questions about your: Alcohol use. Tobacco use. Drug use. Emotional well-being. Home and relationship well-being. Sexual activity. Eating habits. History of falls. Memory and ability to understand (cognition). Work and work Statistician. Screening  You may have the following tests or measurements: Height, weight, and BMI. Blood pressure. Lipid and cholesterol levels. These may be checked every 5 years, or more frequently if you are over 66 years old. Skin check. Lung cancer screening. You may have this screening every year starting at age 41 if you have a 30-pack-year history of smoking and currently smoke or have quit within the past 15 years. Fecal occult blood test (FOBT) of the stool. You may have this test every year starting at age 46. Flexible sigmoidoscopy or colonoscopy. You may have a sigmoidoscopy every 5 years or a colonoscopy every 10 years starting at age 34. Prostate cancer screening. Recommendations will vary depending on your family history and other risks. Hepatitis C blood test. Hepatitis B blood test. Sexually transmitted disease (STD) testing. Diabetes screening. This is done by checking your blood sugar (glucose) after you have not eaten for a while (fasting). You may have this done every 1-3 years. Abdominal aortic aneurysm (AAA) screening. You may need this if you are a current or former smoker. Osteoporosis. You may be screened starting at age 72 if you are at high risk. Talk with your health care provider about your test results, treatment options, and if necessary, the need for more tests. Vaccines  Your health care provider may recommend certain vaccines, such as: Influenza vaccine. This is  recommended every year. Tetanus, diphtheria, and acellular pertussis (Tdap, Td) vaccine. You may need a Td booster every 10 years. Zoster  vaccine. You may need this after age 14. Pneumococcal 13-valent conjugate (PCV13) vaccine. One dose is recommended after age 42. Pneumococcal polysaccharide (PPSV23) vaccine. One dose is recommended after age 62. Talk to your health care provider about which screenings and vaccines you need and how often you need them. This information is not intended to replace advice given to you by your health care provider. Make sure you discuss any questions you have with your health care provider. Document Released: 03/03/2015 Document Revised: 10/25/2015 Document Reviewed: 12/06/2014 Elsevier Interactive Patient Education  2017 [place] Prevention in the Home Falls can cause injuries. They can happen to people of all ages. There are many things you can do to make your home safe and to help prevent falls. What can I do on the outside of my home? Regularly fix the edges of walkways and driveways and fix any cracks. Remove anything that might make you trip as you walk through a door, such as a raised step or threshold. Trim any bushes or trees on the path to your home. Use bright outdoor lighting. Clear any walking paths of anything that might make someone trip, such as rocks or tools. Regularly check to see if handrails are loose or broken. Make sure that both sides of any steps have handrails. Any raised decks and porches should have guardrails on the edges. Have any leaves, snow, or ice cleared regularly. Use sand or salt on walking paths during winter. Clean up any spills in your garage right away. This includes oil or grease spills. What can I do in the bathroom? Use night lights. Install grab bars by the toilet and in the tub and shower. Do not use towel bars as grab bars. Use non-skid mats or decals in the tub or shower. If you need to sit down in the shower, use a plastic, non-slip stool. Keep the floor dry. Clean up any water that spills on the floor as soon as it happens. Remove  soap buildup in the tub or shower regularly. Attach bath mats securely with double-sided non-slip rug tape. Do not have throw rugs and other things on the floor that can make you trip. What can I do in the bedroom? Use night lights. Make sure that you have a light by your bed that is easy to reach. Do not use any sheets or blankets that are too big for your bed. They should not hang down onto the floor. Have a firm chair that has side arms. You can use this for support while you get dressed. Do not have throw rugs and other things on the floor that can make you trip. What can I do in the kitchen? Clean up any spills right away. Avoid walking on wet floors. Keep items that you use a lot in easy-to-reach places. If you need to reach something above you, use a strong step stool that has a grab bar. Keep electrical cords out of the way. Do not use floor polish or wax that makes floors slippery. If you must use wax, use non-skid floor wax. Do not have throw rugs and other things on the floor that can make you trip. What can I do with my stairs? Do not leave any items on the stairs. Make sure that there are handrails on both sides of the stairs and use them. Fix handrails that are  broken or loose. Make sure that handrails are as long as the stairways. Check any carpeting to make sure that it is firmly attached to the stairs. Fix any carpet that is loose or worn. Avoid having throw rugs at the top or bottom of the stairs. If you do have throw rugs, attach them to the floor with carpet tape. Make sure that you have a light switch at the top of the stairs and the bottom of the stairs. If you do not have them, ask someone to add them for you. What else can I do to help prevent falls? Wear shoes that: Do not have high heels. Have rubber bottoms. Are comfortable and fit you well. Are closed at the toe. Do not wear sandals. If you use a stepladder: Make sure that it is fully opened. Do not climb a  closed stepladder. Make sure that both sides of the stepladder are locked into place. Ask someone to hold it for you, if possible. Clearly mark and make sure that you can see: Any grab bars or handrails. First and last steps. Where the edge of each step is. Use tools that help you move around (mobility aids) if they are needed. These include: Canes. Walkers. Scooters. Crutches. Turn on the lights when you go into a dark area. Replace any light bulbs as soon as they burn out. Set up your furniture so you have a clear path. Avoid moving your furniture around. If any of your floors are uneven, fix them. If there are any pets around you, be aware of where they are. Review your medicines with your doctor. Some medicines can make you feel dizzy. This can increase your chance of falling. Ask your doctor what other things that you can do to help prevent falls. This information is not intended to replace advice given to you by your health care provider. Make sure you discuss any questions you have with your health care provider. Document Released: 12/01/2008 Document Revised: 07/13/2015 Document Reviewed: 03/11/2014 Elsevier Interactive Patient Education  2017 [employer].

## 2021-04-05 ENCOUNTER — Ambulatory Visit (INDEPENDENT_AMBULATORY_CARE_PROVIDER_SITE_OTHER): Payer: Medicare HMO | Admitting: Nurse Practitioner

## 2021-04-05 ENCOUNTER — Encounter: Payer: Self-pay | Admitting: Nurse Practitioner

## 2021-04-05 VITALS — BP 120/79 | HR 60 | Temp 98.9°F | Resp 20 | Ht 72.0 in | Wt 189.0 lb

## 2021-04-05 DIAGNOSIS — E782 Mixed hyperlipidemia: Secondary | ICD-10-CM | POA: Diagnosis not present

## 2021-04-05 DIAGNOSIS — I10 Essential (primary) hypertension: Secondary | ICD-10-CM | POA: Diagnosis not present

## 2021-04-05 DIAGNOSIS — N400 Enlarged prostate without lower urinary tract symptoms: Secondary | ICD-10-CM | POA: Diagnosis not present

## 2021-04-05 DIAGNOSIS — Q825 Congenital non-neoplastic nevus: Secondary | ICD-10-CM | POA: Diagnosis not present

## 2021-04-05 DIAGNOSIS — K432 Incisional hernia without obstruction or gangrene: Secondary | ICD-10-CM | POA: Diagnosis not present

## 2021-04-05 LAB — CBC WITH DIFFERENTIAL/PLATELET
Basophils Absolute: 0.1 10*3/uL (ref 0.0–0.2)
Basos: 1 %
EOS (ABSOLUTE): 0.3 10*3/uL (ref 0.0–0.4)
Eos: 4 %
Hematocrit: 44 % (ref 37.5–51.0)
Hemoglobin: 15.2 g/dL (ref 13.0–17.7)
Immature Grans (Abs): 0 10*3/uL (ref 0.0–0.1)
Immature Granulocytes: 0 %
Lymphocytes Absolute: 2.5 10*3/uL (ref 0.7–3.1)
Lymphs: 33 %
MCH: 33.9 pg — ABNORMAL HIGH (ref 26.6–33.0)
MCHC: 34.5 g/dL (ref 31.5–35.7)
MCV: 98 fL — ABNORMAL HIGH (ref 79–97)
Monocytes Absolute: 1 10*3/uL — ABNORMAL HIGH (ref 0.1–0.9)
Monocytes: 13 %
Neutrophils Absolute: 3.6 10*3/uL (ref 1.4–7.0)
Neutrophils: 49 %
Platelets: 191 10*3/uL (ref 150–450)
RBC: 4.49 x10E6/uL (ref 4.14–5.80)
RDW: 12.1 % (ref 11.6–15.4)
WBC: 7.5 10*3/uL (ref 3.4–10.8)

## 2021-04-05 LAB — LIPID PANEL
Chol/HDL Ratio: 2.6 ratio (ref 0.0–5.0)
Cholesterol, Total: 138 mg/dL (ref 100–199)
HDL: 53 mg/dL (ref >39)
LDL Chol Calc (NIH): 63 mg/dL (ref 0–99)
Triglycerides: 125 mg/dL (ref 0–149)
VLDL Cholesterol Cal: 22 mg/dL (ref 5–40)

## 2021-04-05 LAB — CMP14+EGFR
ALT: 19 IU/L (ref 0–44)
AST: 19 IU/L (ref 0–40)
Albumin/Globulin Ratio: 2.1 (ref 1.2–2.2)
Albumin: 4.2 g/dL (ref 3.7–4.7)
Alkaline Phosphatase: 43 IU/L — ABNORMAL LOW (ref 44–121)
BUN/Creatinine Ratio: 12 (ref 10–24)
BUN: 14 mg/dL (ref 8–27)
Bilirubin Total: 0.6 mg/dL (ref 0.0–1.2)
CO2: 27 mmol/L (ref 20–29)
Calcium: 9.8 mg/dL (ref 8.6–10.2)
Chloride: 101 mmol/L (ref 96–106)
Creatinine, Ser: 1.19 mg/dL (ref 0.76–1.27)
Globulin, Total: 2 g/dL (ref 1.5–4.5)
Glucose: 102 mg/dL — ABNORMAL HIGH (ref 70–99)
Potassium: 4 mmol/L (ref 3.5–5.2)
Sodium: 141 mmol/L (ref 134–144)
Total Protein: 6.2 g/dL (ref 6.0–8.5)
eGFR: 62 mL/min/{1.73_m2} (ref >59)

## 2021-04-05 MED ORDER — TAMSULOSIN HCL 0.4 MG PO CAPS: 0.4000 mg | ORAL_CAPSULE | Freq: Every day | ORAL | 1 refills | Status: DC

## 2021-04-05 MED ORDER — SIMVASTATIN 40 MG PO TABS: 40.0000 mg | ORAL_TABLET | Freq: Every day | ORAL | 1 refills | Status: DC

## 2021-04-05 MED ORDER — FINASTERIDE 5 MG PO TABS: 5.0000 mg | ORAL_TABLET | Freq: Every day | ORAL | 1 refills | Status: DC

## 2021-04-05 MED ORDER — LISINOPRIL-HYDROCHLOROTHIAZIDE 10-12.5 MG PO TABS: 1.0000 | ORAL_TABLET | Freq: Every day | ORAL | 1 refills | Status: DC

## 2021-04-05 NOTE — Patient Instructions (Signed)

## 2021-04-05 NOTE — Progress Notes (Signed)
Subjective:    Patient ID: Andre Estrada, male    DOB: 06/22/1940, 81 y.o.   MRN: [id number]  medical management of chronic issues     HPI:  Andre Estrada is a 81 y.o. who identifies as a male who was assigned male at birth.   Social history: Lives with: his son Work history: retired   Scientist, forensic in today for follow up of the following chronic medical issues:  1. Primary hypertension No c/o chest pain, sob to headache. Doe snot check blood pressure at home. BP Readings from Last 3 Encounters:  11/03/20 110/66  11/02/20 116/74  10/30/20 124/81     2. Mixed hyperlipidemia Does not watch diet and does no dedicated exercise. Lab Results  Component Value Date   CHOL 137 10/02/2020   HDL 46 10/02/2020   LDLCALC 70 10/02/2020   TRIG 114 10/02/2020   CHOLHDL 3.0 10/02/2020     3. Benign prostatic hyperplasia without lower urinary tract symptoms Denies any problems voiding. Lab Results  Component Value Date   PSA1 3.5 11/02/2020   PSA1 3.6 05/04/2020   PSA1 2.8 09/30/2019   PSA 6.5 (H) 02/14/2014      4. Incisional hernia, without obstruction or gangrene Has a very large incisional hernia. Denies any pain  5. Birth mark Large seedy birthmark on left side of face. Has not changed.   New complaints: None today  Allergies  Allergen Reactions   Sulfa Antibiotics    Outpatient Encounter Medications as of 04/05/2021  Medication Sig   aspirin 81 MG tablet Take 81 mg by mouth daily.   cephALEXin (KEFLEX) 500 MG capsule Take 1 capsule (500 mg total) by mouth 2 (two) times daily. (Patient not taking: Reported on 03/08/2021)   finasteride (PROSCAR) 5 MG tablet Take 1 tablet (5 mg total) by mouth daily.   hydrocortisone 1 % lotion Apply 1 application topically 2 (two) times daily. (Patient not taking: Reported on 03/08/2021)   lisinopril-hydrochlorothiazide (ZESTORETIC) 10-12.5 MG tablet Take 1 tablet by mouth daily.   simvastatin (ZOCOR) 40 MG tablet Take 1 tablet  (40 mg total) by mouth at bedtime.   tamsulosin (FLOMAX) 0.4 MG CAPS capsule Take 1 capsule (0.4 mg total) by mouth daily after supper.   triamcinolone cream (KENALOG) 0.1 % Apply 1 application topically 2 (two) times daily.   No facility-administered encounter medications on file as of 04/05/2021.    Past Surgical History:  Procedure Laterality Date   LYMPH NODE BIOPSY     MELANOMA EXCISION     PARTIAL COLECTOMY     SPLENECTOMY, TOTAL      Family History  Problem Relation Age of Onset   Coronary artery disease Father    COPD Father    Prostatitis Brother    Diabetes Son    Prostatitis Brother    Alcohol abuse Brother       Controlled substance contract: n/a      Review of Systems  Constitutional:  Negative for diaphoresis.  Eyes:  Negative for pain.  Respiratory:  Negative for shortness of breath.   Cardiovascular:  Negative for chest pain, palpitations and leg swelling.  Gastrointestinal:  Negative for abdominal pain.  Endocrine: Negative for polydipsia.  Skin:  Negative for rash.  Neurological:  Negative for dizziness, weakness and headaches.  Hematological:  Does not bruise/bleed easily.  All other systems reviewed and are negative.     Objective:   Physical Exam Vitals and nursing note reviewed.  Constitutional:  Appearance: Normal appearance. He is well-developed.  HENT:     Head: Normocephalic.     Nose: Nose normal.     Mouth/Throat:     Mouth: Mucous membranes are moist.     Pharynx: Oropharynx is clear.  Eyes:     Pupils: Pupils are equal, round, and reactive to light.  Neck:     Thyroid: No thyroid mass or thyromegaly.     Vascular: No carotid bruit or JVD.     Trachea: Phonation normal.  Cardiovascular:     Rate and Rhythm: Normal rate and regular rhythm.  Pulmonary:     Effort: Pulmonary effort is normal. No respiratory distress.     Breath sounds: Normal breath sounds.  Abdominal:     General: Bowel sounds are normal.      Palpations: Abdomen is soft.     Tenderness: There is no abdominal tenderness.  Musculoskeletal:        General: Normal range of motion.     Cervical back: Normal range of motion and neck supple.  Lymphadenopathy:     Cervical: No cervical adenopathy.  Skin:    General: Skin is warm and dry.  Neurological:     Mental Status: He is alert and oriented to person, place, and time.  Psychiatric:        Behavior: Behavior normal.        Thought Content: Thought content normal.        Judgment: Judgment normal.    BP 120/79    Pulse 60    Temp 98.9 F (37.2 C) (Temporal)    Resp 20    Ht 6' (1.829 m)    Wt 189 lb (85.7 kg)    SpO2 99%    BMI 25.63 kg/m        Assessment & Plan:  [facility] comes in today with chief complaint of Medical Management of Chronic Issues   Diagnosis and orders addressed:  1. Primary hypertension Low sodium diet - lisinopril-hydrochlorothiazide (ZESTORETIC) 10-12.5 MG tablet; Take 1 tablet by mouth daily.  Dispense: 90 tablet; Refill: 1  2. Mixed hyperlipidemia Low fat diet - simvastatin (ZOCOR) 40 MG tablet; Take 1 tablet (40 mg total) by mouth at bedtime.  Dispense: 90 tablet; Refill: 1  3. Benign prostatic hyperplasia without lower urinary tract symptoms Report ay problems voiding - finasteride (PROSCAR) 5 MG tablet; Take 1 tablet (5 mg total) by mouth daily.  Dispense: 90 tablet; Refill: 1 - tamsulosin (FLOMAX) 0.4 MG CAPS capsule; Take 1 capsule (0.4 mg total) by mouth daily after supper.  Dispense: 90 capsule; Refill: 1  4. Incisional hernia, without obstruction or gangrene Report any pain  5. Birth mark  Orders Placed This Encounter  Procedures   CBC with Differential/Platelet   CMP14+EGFR   Lipid panel    Labs pending Health Maintenance reviewed Diet and exercise encouraged  Follow up plan: 6 months   Mary-Margaret Hassell Done, FNP

## 2021-04-06 NOTE — Progress Notes (Signed)
Pt returning call. Please call back.

## 2021-04-25 ENCOUNTER — Other Ambulatory Visit: Payer: Self-pay

## 2021-04-25 ENCOUNTER — Other Ambulatory Visit: Payer: Medicare Other

## 2021-04-25 DIAGNOSIS — C61 Malignant neoplasm of prostate: Secondary | ICD-10-CM

## 2021-04-26 LAB — PSA: Prostate Specific Ag, Serum: 4.6 ng/mL — ABNORMAL HIGH (ref 0.0–4.0)

## 2021-04-27 ENCOUNTER — Telehealth: Payer: Self-pay

## 2021-04-27 NOTE — Progress Notes (Signed)
Sent via mail 

## 2021-04-27 NOTE — Telephone Encounter (Signed)
-----   Message from Dorisann Frames, RN sent at 04/27/2021  8:47 AM EST ----- ?Can you notify patient today please? ?----- Message ----- ?From: Irine Seal, MD ?Sent: 04/27/2021   7:38 AM EST ?To: Dorisann Frames, RN ? ?His PSA is up from the last but has been variable in the past.  I would like another done a week prior to his April f/u to see if there is any further change.  ?----- Message ----- ?From: Dorisann Frames, RN ?Sent: 04/26/2021   8:31 AM EST ?To: Irine Seal, MD ? ?Please review ? ? ?

## 2021-04-27 NOTE — Telephone Encounter (Signed)
Letter sent.  ?Lab appointment made. ?

## 2021-04-27 NOTE — Telephone Encounter (Signed)
Patient returned call and made aware.

## 2021-04-27 NOTE — Telephone Encounter (Signed)
See other note

## 2021-05-02 DIAGNOSIS — R69 Illness, unspecified: Secondary | ICD-10-CM | POA: Diagnosis not present

## 2021-05-03 ENCOUNTER — Ambulatory Visit: Payer: Medicare Other | Admitting: Urology

## 2021-05-25 ENCOUNTER — Other Ambulatory Visit: Payer: Medicare HMO

## 2021-05-31 ENCOUNTER — Ambulatory Visit: Payer: Self-pay | Admitting: Urology

## 2021-06-28 ENCOUNTER — Other Ambulatory Visit: Payer: Medicare HMO

## 2021-06-28 DIAGNOSIS — C61 Malignant neoplasm of prostate: Secondary | ICD-10-CM | POA: Diagnosis not present

## 2021-06-29 LAB — PSA: Prostate Specific Ag, Serum: 3.9 ng/mL (ref 0.0–4.0)

## 2021-07-05 ENCOUNTER — Encounter: Payer: Self-pay | Admitting: Urology

## 2021-07-05 ENCOUNTER — Ambulatory Visit (INDEPENDENT_AMBULATORY_CARE_PROVIDER_SITE_OTHER): Payer: Medicare HMO | Admitting: Urology

## 2021-07-05 VITALS — BP 124/80 | HR 72 | Ht 72.0 in

## 2021-07-05 DIAGNOSIS — N400 Enlarged prostate without lower urinary tract symptoms: Secondary | ICD-10-CM

## 2021-07-05 DIAGNOSIS — C61 Malignant neoplasm of prostate: Secondary | ICD-10-CM

## 2021-07-05 DIAGNOSIS — N401 Enlarged prostate with lower urinary tract symptoms: Secondary | ICD-10-CM | POA: Diagnosis not present

## 2021-07-05 DIAGNOSIS — N138 Other obstructive and reflux uropathy: Secondary | ICD-10-CM

## 2021-07-05 DIAGNOSIS — Z8546 Personal history of malignant neoplasm of prostate: Secondary | ICD-10-CM

## 2021-07-05 DIAGNOSIS — R351 Nocturia: Secondary | ICD-10-CM

## 2021-07-05 LAB — URINALYSIS, ROUTINE W REFLEX MICROSCOPIC
Bilirubin, UA: NEGATIVE
Glucose, UA: NEGATIVE
Ketones, UA: NEGATIVE
Leukocytes,UA: NEGATIVE
Nitrite, UA: NEGATIVE
Protein,UA: NEGATIVE
RBC, UA: NEGATIVE
Specific Gravity, UA: 1.015 (ref 1.005–1.030)
Urobilinogen, Ur: 0.2 mg/dL (ref 0.2–1.0)
pH, UA: 7.5 (ref 5.0–7.5)

## 2021-07-05 MED ORDER — FINASTERIDE 5 MG PO TABS: 5.0000 mg | ORAL_TABLET | Freq: Every day | ORAL | 3 refills | Status: DC

## 2021-07-05 NOTE — Progress Notes (Signed)
Subjective:  1. Prostate cancer ([place])   2. BPH with urinary obstruction   3. Nocturia   4. Benign prostatic hyperplasia without lower urinary tract symptoms       07/05/21: He has T2a Gleason 6 prostate cancer found in a single core in the right apex with 5% involvement on biopsy in 4/16. His PSA  on 06/28/21 was 3.9 which is back down from 4.6 in 3/23.  It was 2.8 with a 26% f/t ratio in 9/21 after rising to 4.1 from 3.8 but it has been variable in the past and was 4.4 in 8/19.   He remains on finasteride and tamsulosin for his BPH with BOO.  He has some mild orthostasis when he gets out of the car.  He is voiding well and has nocturia x 1.  He has no associated signs or symptoms.   His UA is clear.   GU Hx: He had a right base nodule and a prior negative biopsy in 2014. His PSA on 7/1 was 9.4 with 25% f/t ratio and it was 8.15 prior to the biopsy but it was down to 4.6 with a 38% f/t ratio on a PSA from October 2016. Most recent PSA was 5.3 08/05/16 which was a decline from 7.7 in 12/17. He has not had a repeat biopsy            ROS:  ROS:  A complete review of systems was performed.  All systems are negative except for pertinent findings as noted.   Review of Systems  All other systems reviewed and are negative.  Allergies  Allergen Reactions   Sulfa Antibiotics     Outpatient Encounter Medications as of 07/05/2021  Medication Sig Note   aspirin 81 MG tablet Take 81 mg by mouth daily.    hydrocortisone 1 % lotion Apply 1 application topically 2 (two) times daily.    lisinopril-hydrochlorothiazide (ZESTORETIC) 10-12.5 MG tablet Take 1 tablet by mouth daily.    simvastatin (ZOCOR) 40 MG tablet Take 1 tablet (40 mg total) by mouth at bedtime.    triamcinolone cream (KENALOG) 0.1 % Apply 1 application topically 2 (two) times daily.    [DISCONTINUED] finasteride (PROSCAR) 5 MG tablet Take 1 tablet (5 mg total) by mouth daily.    [DISCONTINUED] tamsulosin (FLOMAX) 0.4 MG CAPS  capsule Take 1 capsule (0.4 mg total) by mouth daily after supper. 07/05/2021: orthostasis   finasteride (PROSCAR) 5 MG tablet Take 1 tablet (5 mg total) by mouth daily.    No facility-administered encounter medications on file as of 07/05/2021.    Past Medical History:  Diagnosis Date   Fracture of thumb    left   Hyperlipidemia    Hypertension    Melanoma ([place])    BACK AND STOMACH    Prostate enlargement    Sinusitis    Tobacco dependence     Past Surgical History:  Procedure Laterality Date   LYMPH NODE BIOPSY     MELANOMA EXCISION     PARTIAL COLECTOMY     SPLENECTOMY, TOTAL      Social History   Socioeconomic History   Marital status: Widowed    Spouse name: Not on file   Number of children: 1   Years of education: 9   Highest education level: 9th grade  Occupational History   Occupation: retired  Tobacco Use   Smoking status: Every Day    Packs/day: 1.00    Years: 60.00    Pack years: 60.00  Types: Cigarettes   Smokeless tobacco: Never  Vaping Use   Vaping Use: Never used  Substance and Sexual Activity   Alcohol use: No   Drug use: No   Sexual activity: Not Currently  Other Topics Concern   Not on file  Social History Narrative   Son lives with him   He's still very independent   Social Determinants of Health   Financial Resource Strain: Medium Risk   Difficulty of Paying Living Expenses: Somewhat hard  Food Insecurity: No Food Insecurity   Worried About Charity fundraiser in the Last Year: Never true   Ran Out of Food in the Last Year: Never true  Transportation Needs: No Transportation Needs   Lack of Transportation (Medical): No   Lack of Transportation (Non-Medical): No  Physical Activity: Insufficiently Active   Days of Exercise per Week: 7 days   Minutes of Exercise per Session: 20 min  Stress: No Stress Concern Present   Feeling of Stress : Not at all  Social Connections: Socially Isolated   Frequency of Communication with Friends  and Family: More than three times a week   Frequency of Social Gatherings with Friends and Family: More than three times a week   Attends Religious Services: Never   Marine scientist or Organizations: No   Attends Archivist Meetings: Never   Marital Status: Widowed  Human resources officer Violence: Not At Risk   Fear of Current or Ex-Partner: No   Emotionally Abused: No   Physically Abused: No   Sexually Abused: No    Family History  Problem Relation Age of Onset   Coronary artery disease Father    COPD Father    Prostatitis Brother    Diabetes Son    Prostatitis Brother    Alcohol abuse Brother        Objective: BP 124/80 (BP Location: Left Arm, Patient Position: Sitting, Cuff Size: Normal)   Pulse 72   Ht 6' (1.829 m)   BMI 25.63 kg/m     Physical Exam Vitals reviewed.  Constitutional:      Appearance: Normal appearance.  Eyes:     General:        Right eye: No discharge.  Genitourinary:    Comments: AP with lesions. NST without mass. Prostate 2+ with stable right base induration. SV non-palpable.  Neurological:     Mental Status: He is alert.    Lab Results:  Results for orders placed or performed in visit on 07/05/21 (from the past 24 hour(s))  Urinalysis, Routine w reflex microscopic     Status: None   Collection Time: 07/05/21 10:20 AM  Result Value Ref Range   Specific Gravity, UA 1.015 1.005 - 1.030   pH, UA 7.5 5.0 - 7.5   Color, UA Yellow Yellow   Appearance Ur Clear Clear   Leukocytes,UA Negative Negative   Protein,UA Negative Negative/Trace   Glucose, UA Negative Negative   Ketones, UA Negative Negative   RBC, UA Negative Negative   Bilirubin, UA Negative Negative   Urobilinogen, Ur 0.2 0.2 - 1.0 mg/dL   Nitrite, UA Negative Negative   Microscopic Examination Comment    Narrative   Performed at:  [facility] [address] [place], [place]  [id number] Lab Director: Mina Marble MT, Phone:  [phone]       Lab Results  Component Value Date   PSA1 3.9 06/28/2021   PSA1 4.6 (H) 04/25/2021   PSA1 3.5 11/02/2020  BMET No results for input(s): NA, K, CL, CO2, GLUCOSE, BUN, CREATININE, CALCIUM in the last 72 hours.   Prior records    Studies/Results: No results found.  Recent Results (from the past 2160 hour(s))  PSA     Status: Abnormal   Collection Time: 04/25/21  9:07 AM  Result Value Ref Range   Prostate Specific Ag, Serum 4.6 (H) 0.0 - 4.0 ng/mL    Comment: Roche ECLIA methodology. According to the American Urological Association, Serum PSA should decrease and remain at undetectable levels after radical prostatectomy. The AUA defines biochemical recurrence as an initial PSA value 0.2 ng/mL or greater followed by a subsequent confirmatory PSA value 0.2 ng/mL or greater. Values obtained with different assay methods or kits cannot be used interchangeably. Results cannot be interpreted as absolute evidence of the presence or absence of malignant disease.   PSA     Status: None   Collection Time: 06/28/21 10:01 AM  Result Value Ref Range   Prostate Specific Ag, Serum 3.9 0.0 - 4.0 ng/mL    Comment: Roche ECLIA methodology. According to the American Urological Association, Serum PSA should decrease and remain at undetectable levels after radical prostatectomy. The AUA defines biochemical recurrence as an initial PSA value 0.2 ng/mL or greater followed by a subsequent confirmatory PSA value 0.2 ng/mL or greater. Values obtained with different assay methods or kits cannot be used interchangeably. Results cannot be interpreted as absolute evidence of the presence or absence of malignant disease.   Urinalysis, Routine w reflex microscopic     Status: None   Collection Time: 07/05/21 10:20 AM  Result Value Ref Range   Specific Gravity, UA 1.015 1.005 - 1.030   pH, UA 7.5 5.0 - 7.5   Color, UA Yellow Yellow   Appearance Ur Clear Clear   Leukocytes,UA Negative Negative    Protein,UA Negative Negative/Trace   Glucose, UA Negative Negative   Ketones, UA Negative Negative   RBC, UA Negative Negative   Bilirubin, UA Negative Negative   Urobilinogen, Ur 0.2 0.2 - 1.0 mg/dL   Nitrite, UA Negative Negative   Microscopic Examination Comment     Comment: Microscopic follows if indicated.     Assessment & Plan: Prostate cancer on surveillance.   PSA has been suppressed with  finasteride.   He will continue that medication.   I will get a PSA  in 6 months.  He is reluctant to have another biopsy and at his age of near 3 I am not sure there would be much value in that.   BPH with BOO and frequency.  He is doing well on tamsulosin and finasteride but he has been having some occasional orthostasis so I will have him hold the tamsulosin. .     Meds ordered this encounter  Medications   finasteride (PROSCAR) 5 MG tablet    Sig: Take 1 tablet (5 mg total) by mouth daily.    Dispense:  90 tablet    Refill:  3      Orders Placed This Encounter  Procedures   Urinalysis, Routine w reflex microscopic   PSA    Standing Status:   Future    Standing Expiration Date:   07/06/2022      Return in about 6 months (around 01/05/2022) for with PSA.   CC: Chevis Pretty, FNP      Irine Seal 07/05/2021 Patient ID: Jadene Pierini, male   DOB: Aug 07, 1940, 81 y.o.   MRN: [id number]

## 2021-10-05 ENCOUNTER — Encounter: Payer: Self-pay | Admitting: Nurse Practitioner

## 2021-10-05 ENCOUNTER — Ambulatory Visit (INDEPENDENT_AMBULATORY_CARE_PROVIDER_SITE_OTHER): Payer: Medicare HMO | Admitting: Nurse Practitioner

## 2021-10-05 VITALS — BP 125/77 | HR 57 | Temp 98.1°F | Resp 20 | Ht 72.0 in | Wt 175.0 lb

## 2021-10-05 DIAGNOSIS — E782 Mixed hyperlipidemia: Secondary | ICD-10-CM

## 2021-10-05 DIAGNOSIS — Z6828 Body mass index (BMI) 28.0-28.9, adult: Secondary | ICD-10-CM | POA: Diagnosis not present

## 2021-10-05 DIAGNOSIS — K432 Incisional hernia without obstruction or gangrene: Secondary | ICD-10-CM

## 2021-10-05 DIAGNOSIS — Q825 Congenital non-neoplastic nevus: Secondary | ICD-10-CM | POA: Diagnosis not present

## 2021-10-05 DIAGNOSIS — M545 Low back pain, unspecified: Secondary | ICD-10-CM

## 2021-10-05 DIAGNOSIS — N400 Enlarged prostate without lower urinary tract symptoms: Secondary | ICD-10-CM | POA: Diagnosis not present

## 2021-10-05 DIAGNOSIS — I10 Essential (primary) hypertension: Secondary | ICD-10-CM | POA: Diagnosis not present

## 2021-10-05 MED ORDER — LISINOPRIL-HYDROCHLOROTHIAZIDE 10-12.5 MG PO TABS: 1.0000 | ORAL_TABLET | Freq: Every day | ORAL | 1 refills | Status: DC

## 2021-10-05 MED ORDER — SIMVASTATIN 40 MG PO TABS: 40.0000 mg | ORAL_TABLET | Freq: Every day | ORAL | 1 refills | Status: DC

## 2021-10-05 MED ORDER — KETOROLAC TROMETHAMINE 60 MG/2ML IM SOLN
60.0000 mg | Freq: Once | INTRAMUSCULAR | Status: AC
  Administered 2021-10-05: 60 mg via INTRAMUSCULAR

## 2021-10-05 NOTE — Addendum Note (Signed)
Addended by: Rolena Infante on: 10/05/2021 08:29 AM   Modules accepted: Orders

## 2021-10-05 NOTE — Progress Notes (Signed)
Subjective:    Patient ID: Andre Estrada, male    DOB: 30-Nov-1940, 81 y.o.   MRN: [id number]   Chief Complaint: medical management of chronic issues     HPI:  Andre Estrada is a 81 y.o. who identifies as a male who was assigned male at birth.   Social history: Lives with: nephew Work history: retired   Scientist, forensic in today for follow up of the following chronic medical issues:  1. Primary hypertension No c/o chst pain, sob or headache. Does not check blood pressure at home. BP Readings from Last 3 Encounters:  07/05/21 124/80  04/05/21 120/79  11/03/20 110/66     2. Mixed hyperlipidemia Does not really watch diet and does no dedicated exercise. Lab Results  Component Value Date   CHOL 138 04/05/2021   HDL 53 04/05/2021   LDLCALC 63 04/05/2021   TRIG 125 04/05/2021   CHOLHDL 2.6 04/05/2021     3. Benign prostatic hyperplasia without lower urinary tract symptoms No voiding issues. Lab Results  Component Value Date   PSA1 3.9 06/28/2021   PSA1 4.6 (H) 04/25/2021   PSA1 3.5 11/02/2020   PSA 6.5 (H) 02/14/2014      4. Incisional hernia, without obstruction or gangrene No pain  5. Birth mark Does not bother him. Has started growing closer to his left eye.  6. BMI 28.0-28.9,adult No recent weight changes   New complaints: Low back pain on right side for the last coupe of weeks. Rates pain 2-3/10.  Rubbed icy hot and took  motrin.  Allergies  Allergen Reactions   Sulfa Antibiotics    Outpatient Encounter Medications as of 10/05/2021  Medication Sig   aspirin 81 MG tablet Take 81 mg by mouth daily.   finasteride (PROSCAR) 5 MG tablet Take 1 tablet (5 mg total) by mouth daily.   hydrocortisone 1 % lotion Apply 1 application topically 2 (two) times daily.   lisinopril-hydrochlorothiazide (ZESTORETIC) 10-12.5 MG tablet Take 1 tablet by mouth daily.   simvastatin (ZOCOR) 40 MG tablet Take 1 tablet (40 mg total) by mouth at bedtime.   triamcinolone cream  (KENALOG) 0.1 % Apply 1 application topically 2 (two) times daily.   No facility-administered encounter medications on file as of 10/05/2021.    Past Surgical History:  Procedure Laterality Date   LYMPH NODE BIOPSY     MELANOMA EXCISION     PARTIAL COLECTOMY     SPLENECTOMY, TOTAL      Family History  Problem Relation Age of Onset   Coronary artery disease Father    COPD Father    Prostatitis Brother    Diabetes Son    Prostatitis Brother    Alcohol abuse Brother       Controlled substance contract: n/a     Review of Systems  Constitutional:  Negative for diaphoresis.  Eyes:  Negative for pain.  Respiratory:  Negative for shortness of breath.   Cardiovascular:  Negative for chest pain, palpitations and leg swelling.  Gastrointestinal:  Negative for abdominal pain.  Endocrine: Negative for polydipsia.  Musculoskeletal:  Positive for back pain.  Skin:  Negative for rash.  Neurological:  Negative for dizziness, weakness and headaches.  Hematological:  Does not bruise/bleed easily.  All other systems reviewed and are negative.      Objective:   Physical Exam Vitals and nursing note reviewed.  Constitutional:      Appearance: Normal appearance. He is well-developed.  HENT:     Head: Normocephalic.  Nose: Nose normal.     Mouth/Throat:     Mouth: Mucous membranes are moist.     Pharynx: Oropharynx is clear.  Eyes:     Pupils: Pupils are equal, round, and reactive to light.  Neck:     Thyroid: No thyroid mass or thyromegaly.     Vascular: No carotid bruit or JVD.     Trachea: Phonation normal.  Cardiovascular:     Rate and Rhythm: Normal rate and regular rhythm.  Pulmonary:     Effort: Pulmonary effort is normal. No respiratory distress.     Breath sounds: Normal breath sounds.  Abdominal:     General: Bowel sounds are normal.     Palpations: Abdomen is soft.     Tenderness: There is no abdominal tenderness.  Musculoskeletal:        General: Normal  range of motion.     Cervical back: Normal range of motion and neck supple.     Comments: FROM of lumbar spine with pain on rotation to the left (+) SLR on right at 90 degrees Motor strength and sensation distally intact  Lymphadenopathy:     Cervical: No cervical adenopathy.  Skin:    General: Skin is warm and dry.  Neurological:     Mental Status: He is alert and oriented to person, place, and time.  Psychiatric:        Behavior: Behavior normal.        Thought Content: Thought content normal.        Judgment: Judgment normal.     BP 125/77   Pulse (!) 57   Temp 98.1 F (36.7 C) (Temporal)   Resp 20   Ht 6' (1.829 m)   Wt 175 lb (79.4 kg)   SpO2 99%   BMI 23.73 kg/m        Assessment & Plan:   [facility] comes in today with chief complaint of Medical Management of Chronic Issues (Low back pain/)   Diagnosis and orders addressed:  1. Primary hypertension Low sodium diet - lisinopril-hydrochlorothiazide (ZESTORETIC) 10-12.5 MG tablet; Take 1 tablet by mouth daily.  Dispense: 90 tablet; Refill: 1  2. Mixed hyperlipidemia Low fat diet - simvastatin (ZOCOR) 40 MG tablet; Take 1 tablet (40 mg total) by mouth at bedtime.  Dispense: 90 tablet; Refill: 1  3. Benign prostatic hyperplasia without lower urinary tract symptoms Report any voiding issues Continue proscar as prescribed  4. Incisional hernia, without obstruction or gangrene Report any pain  5. Birth mark  6. BMI 28.0-28.9,adult Discussed diet and exercise for person with BMI >25 Will recheck weight in 3-6 months   7. Acute right-sided low back pain without sciatica Moist heat Rest'  No heavy lifting - ketorolac (TORADOL) injection 60 mg   Labs pending Health Maintenance reviewed Diet and exercise encouraged  Follow up plan: 6 months   [place], FNP

## 2021-10-05 NOTE — Patient Instructions (Signed)
Acute Back Pain, Adult Acute back pain is sudden and usually short-lived. It is often caused by an injury to the muscles and tissues in the back. The injury may result from: A muscle, tendon, or ligament getting overstretched or torn. Ligaments are tissues that connect bones to each other. Lifting something improperly can cause a back strain. Wear and tear (degeneration) of the spinal disks. Spinal disks are circular tissue that provide cushioning between the bones of the spine (vertebrae). Twisting motions, such as while playing sports or doing yard work. A hit to the back. Arthritis. You may have a physical exam, lab tests, and imaging tests to find the cause of your pain. Acute back pain usually goes away with rest and home care. Follow these instructions at home: Managing pain, stiffness, and swelling Take over-the-counter and prescription medicines only as told by your health care provider. Treatment may include medicines for pain and inflammation that are taken by mouth or applied to the skin, or muscle relaxants. Your health care provider may recommend applying ice during the first 24-48 hours after your pain starts. To do this: Put ice in a plastic bag. Place a towel between your skin and the bag. Leave the ice on for 20 minutes, 2-3 times a day. Remove the ice if your skin turns bright red. This is very important. If you cannot feel pain, heat, or cold, you have a greater risk of damage to the area. If directed, apply heat to the affected area as often as told by your health care provider. Use the heat source that your health care provider recommends, such as a moist heat pack or a heating pad. Place a towel between your skin and the heat source. Leave the heat on for 20-30 minutes. Remove the heat if your skin turns bright red. This is especially important if you are unable to feel pain, heat, or cold. You have a greater risk of getting burned. Activity  Do not stay in bed. Staying in  bed for more than 1-2 days can delay your recovery. Sit up and stand up straight. Avoid leaning forward when you sit or hunching over when you stand. If you work at a desk, sit close to it so you do not need to lean over. Keep your chin tucked in. Keep your neck drawn back, and keep your elbows bent at a 90-degree angle (right angle). Sit high and close to the steering wheel when you drive. Add lower back (lumbar) support to your car seat, if needed. Take short walks on even surfaces as soon as you are able. Try to increase the length of time you walk each day. Do not sit, drive, or stand in one place for more than 30 minutes at a time. Sitting or standing for long periods of time can put stress on your back. Do not drive or use heavy machinery while taking prescription pain medicine. Use proper lifting techniques. When you bend and lift, use positions that put less stress on your back: Bend your knees. Keep the load close to your body. Avoid twisting. Exercise regularly as told by your health care provider. Exercising helps your back heal faster and helps prevent back injuries by keeping muscles strong and flexible. Work with a physical therapist to make a safe exercise program, as recommended by your health care provider. Do any exercises as told by your physical therapist. Lifestyle Maintain a healthy weight. Extra weight puts stress on your back and makes it difficult to have good   posture. Avoid activities or situations that make you feel anxious or stressed. Stress and anxiety increase muscle tension and can make back pain worse. Learn ways to manage anxiety and stress, such as through exercise. General instructions Sleep on a firm mattress in a comfortable position. Try lying on your side with your knees slightly bent. If you lie on your back, put a pillow under your knees. Keep your head and neck in a straight line with your spine (neutral position) when using electronic equipment like  smartphones or pads. To do this: Raise your smartphone or pad to look at it instead of bending your head or neck to look down. Put the smartphone or pad at the level of your face while looking at the screen. Follow your treatment plan as told by your health care provider. This may include: Cognitive or behavioral therapy. Acupuncture or massage therapy. Meditation or yoga. Contact a health care provider if: You have pain that is not relieved with rest or medicine. You have increasing pain going down into your legs or buttocks. Your pain does not improve after 2 weeks. You have pain at night. You lose weight without trying. You have a fever or chills. You develop nausea or vomiting. You develop abdominal pain. Get help right away if: You develop new bowel or bladder control problems. You have unusual weakness or numbness in your arms or legs. You feel faint. These symptoms may represent a serious problem that is an emergency. Do not wait to see if the symptoms will go away. Get medical help right away. Call your local emergency services (911 in the [place] Do not drive yourself to the hospital. Summary Acute back pain is sudden and usually short-lived. Use proper lifting techniques. When you bend and lift, use positions that put less stress on your back. Take over-the-counter and prescription medicines only as told by your health care provider, and apply heat or ice as told. This information is not intended to replace advice given to you by your health care provider. Make sure you discuss any questions you have with your health care provider. Document Revised: 04/28/2020 Document Reviewed: 04/28/2020 Elsevier Patient Education  2023 Elsevier Inc.  

## 2021-10-06 LAB — CBC WITH DIFFERENTIAL/PLATELET
Basophils Absolute: 0.1 10*3/uL (ref 0.0–0.2)
Basos: 1 %
EOS (ABSOLUTE): 0.4 10*3/uL (ref 0.0–0.4)
Eos: 4 %
Hematocrit: 43.4 % (ref 37.5–51.0)
Hemoglobin: 15 g/dL (ref 13.0–17.7)
Immature Grans (Abs): 0 10*3/uL (ref 0.0–0.1)
Immature Granulocytes: 0 %
Lymphocytes Absolute: 2.4 10*3/uL (ref 0.7–3.1)
Lymphs: 28 %
MCH: 33.4 pg — ABNORMAL HIGH (ref 26.6–33.0)
MCHC: 34.6 g/dL (ref 31.5–35.7)
MCV: 97 fL (ref 79–97)
Monocytes Absolute: 1.1 10*3/uL — ABNORMAL HIGH (ref 0.1–0.9)
Monocytes: 13 %
Neutrophils Absolute: 4.6 10*3/uL (ref 1.4–7.0)
Neutrophils: 54 %
Platelets: 128 10*3/uL — ABNORMAL LOW (ref 150–450)
RBC: 4.49 x10E6/uL (ref 4.14–5.80)
RDW: 12.4 % (ref 11.6–15.4)
WBC: 8.4 10*3/uL (ref 3.4–10.8)

## 2021-10-06 LAB — PSA, TOTAL AND FREE
PSA, Free Pct: 22.7 %
PSA, Free: 0.68 ng/mL
Prostate Specific Ag, Serum: 3 ng/mL (ref 0.0–4.0)

## 2021-10-06 LAB — CMP14+EGFR
ALT: 18 IU/L (ref 0–44)
AST: 22 IU/L (ref 0–40)
Albumin/Globulin Ratio: 3.8 — ABNORMAL HIGH (ref 1.2–2.2)
Albumin: 4.9 g/dL — ABNORMAL HIGH (ref 3.8–4.8)
Alkaline Phosphatase: 40 IU/L — ABNORMAL LOW (ref 44–121)
BUN/Creatinine Ratio: 10 (ref 10–24)
BUN: 10 mg/dL (ref 8–27)
Bilirubin Total: 0.8 mg/dL (ref 0.0–1.2)
CO2: 25 mmol/L (ref 20–29)
Calcium: 9.5 mg/dL (ref 8.6–10.2)
Chloride: 100 mmol/L (ref 96–106)
Creatinine, Ser: 1.03 mg/dL (ref 0.76–1.27)
Globulin, Total: 1.3 g/dL — ABNORMAL LOW (ref 1.5–4.5)
Glucose: 105 mg/dL — ABNORMAL HIGH (ref 70–99)
Potassium: 3.6 mmol/L (ref 3.5–5.2)
Sodium: 140 mmol/L (ref 134–144)
Total Protein: 6.2 g/dL (ref 6.0–8.5)
eGFR: 74 mL/min/{1.73_m2} (ref >59)

## 2021-10-06 LAB — LIPID PANEL
Chol/HDL Ratio: 2.8 ratio (ref 0.0–5.0)
Cholesterol, Total: 150 mg/dL (ref 100–199)
HDL: 54 mg/dL (ref >39)
LDL Chol Calc (NIH): 71 mg/dL (ref 0–99)
Triglycerides: 144 mg/dL (ref 0–149)
VLDL Cholesterol Cal: 25 mg/dL (ref 5–40)

## 2021-12-10 ENCOUNTER — Ambulatory Visit (INDEPENDENT_AMBULATORY_CARE_PROVIDER_SITE_OTHER): Payer: Medicare HMO | Admitting: Nurse Practitioner

## 2021-12-10 ENCOUNTER — Encounter: Payer: Self-pay | Admitting: Nurse Practitioner

## 2021-12-10 ENCOUNTER — Ambulatory Visit (INDEPENDENT_AMBULATORY_CARE_PROVIDER_SITE_OTHER): Payer: Medicare HMO

## 2021-12-10 VITALS — BP 137/81 | HR 59 | Temp 98.0°F | Resp 20 | Ht 72.0 in | Wt 173.0 lb

## 2021-12-10 DIAGNOSIS — M25551 Pain in right hip: Secondary | ICD-10-CM | POA: Diagnosis not present

## 2021-12-10 MED ORDER — METHYLPREDNISOLONE ACETATE 80 MG/ML IJ SUSP
80.0000 mg | Freq: Once | INTRAMUSCULAR | Status: AC
  Administered 2021-12-10: 80 mg via INTRAMUSCULAR

## 2021-12-10 MED ORDER — PREDNISONE 20 MG PO TABS: ORAL_TABLET | ORAL | 0 refills | Status: DC

## 2021-12-10 NOTE — Progress Notes (Signed)
   Subjective:    Patient ID: Andre Estrada, male    DOB: 01/29/1941, 81 y.o.   MRN: [id number]   Chief Complaint: Hip Pain (Right hip. Shot helped for a little while but still painful/)   Pt seen today for c/o ongoing R hip pain; pt was seen in this office in August and received toradol injection for same; pain improved for some time but is hurting again.  Hip Pain  Incident onset: no incident. There was no injury mechanism. The pain is present in the right hip. The quality of the pain is described as aching. The pain is moderate. The pain has been Constant since onset. He reports no foreign bodies present. The symptoms are aggravated by weight bearing and movement. He has tried NSAIDs (icy hot) for the symptoms. The treatment provided mild relief.       Review of Systems  Respiratory:  Negative for chest tightness and shortness of breath.   Cardiovascular:  Negative for chest pain and palpitations.  Musculoskeletal:  Positive for arthralgias.       R hip pain  Skin:  Negative for rash.  Neurological:  Negative for dizziness, weakness and headaches.  All other systems reviewed and are negative.      Objective:   Physical Exam Vitals and nursing note reviewed.  Constitutional:      General: He is not in acute distress.    Appearance: Normal appearance.  Cardiovascular:     Rate and Rhythm: Normal rate and regular rhythm.     Heart sounds: Normal heart sounds.  Pulmonary:     Effort: Pulmonary effort is normal. No respiratory distress.     Breath sounds: Normal breath sounds. No wheezing, rhonchi or rales.  Musculoskeletal:     Right hip: No deformity, tenderness or bony tenderness.     Left hip: Normal.  Skin:    General: Skin is warm and dry.  Neurological:     General: No focal deficit present.     Mental Status: He is alert and oriented to person, place, and time.     BP 137/81   Pulse (!) 59   Temp 98 F (36.7 C) (Temporal)   Resp 20   Ht 6' (1.829 m)    Wt 173 lb (78.5 kg)   SpO2 98%   BMI 23.46 kg/m        Assessment & Plan:   [facility] in today with chief complaint of Hip Pain (Right hip. Shot helped for a little while but still painful/)   1. Right hip pain XR showed mild osteoarthritis. Rest Ice or heat  NSAIDs RTO for new or worsening symptoms or if symptoms fail to improve  - DG HIP UNILAT W OR W/O PELVIS 2-3 VIEWS RIGHT - methylPREDNISolone acetate (DEPO-MEDROL) injection 80 mg - predniSONE (DELTASONE) 20 MG tablet; 2 po at sametime daily for 5 days-  Dispense: 10 tablet; Refill: 0    The above assessment and management plan was discussed with the patient. The patient verbalized understanding of and has agreed to the management plan. Patient is aware to call the clinic if symptoms persist or worsen. Patient is aware when to return to the clinic for a follow-up visit. Patient educated on when it is appropriate to go to the emergency department.   Collene Leyden, FNP student  Andre Estrada, [place]

## 2021-12-10 NOTE — Patient Instructions (Signed)
Hip Pain The hip is the joint between the upper legs and the lower pelvis. The bones, cartilage, tendons, and muscles of your hip joint support your body and allow you to move around. Hip pain can range from a minor ache to severe pain in one or both of your hips. The pain may be felt on the inside of the hip joint near the groin, or on the outside near the buttocks and upper thigh. You may also have swelling or stiffness in your hip area. Follow these instructions at home: Managing pain, stiffness, and swelling     If directed, put ice on the painful area. To do this: Put ice in a plastic bag. Place a towel between your skin and the bag. Leave the ice on for 20 minutes, 2-3 times a day. If directed, apply heat to the affected area as often as told by your health care provider. Use the heat source that your health care provider recommends, such as a moist heat pack or a heating pad. Place a towel between your skin and the heat source. Leave the heat on for 20-30 minutes. Remove the heat if your skin turns bright red. This is especially important if you are unable to feel pain, heat, or cold. You may have a greater risk of getting burned. Activity Do exercises as told by your health care provider. Avoid activities that cause pain. General instructions  Take over-the-counter and prescription medicines only as told by your health care provider. Keep a journal of your symptoms. Write down: How often you have hip pain. The location of your pain. What the pain feels like. What makes the pain worse. Sleep with a pillow between your legs on your most comfortable side. Keep all follow-up visits as told by your health care provider. This is important. Contact a health care provider if: You cannot put weight on your leg. Your pain or swelling continues or gets worse after one week. It gets harder to walk. You have a fever. Get help right away if: You fall. You have a sudden increase in pain  and swelling in your hip. Your hip is red or swollen or very tender to touch. Summary Hip pain can range from a minor ache to severe pain in one or both of your hips. The pain may be felt on the inside of the hip joint near the groin, or on the outside near the buttocks and upper thigh. Avoid activities that cause pain. Write down how often you have hip pain, the location of the pain, what makes it worse, and what it feels like. This information is not intended to replace advice given to you by your health care provider. Make sure you discuss any questions you have with your health care provider. Document Revised: 06/22/2018 Document Reviewed: 06/22/2018 Elsevier Patient Education  2023 Elsevier Inc.  

## 2021-12-19 ENCOUNTER — Ambulatory Visit (INDEPENDENT_AMBULATORY_CARE_PROVIDER_SITE_OTHER): Payer: Medicare HMO | Admitting: Family Medicine

## 2021-12-19 ENCOUNTER — Encounter: Payer: Self-pay | Admitting: Family Medicine

## 2021-12-19 VITALS — BP 133/77 | HR 77 | Temp 98.6°F | Ht 72.0 in | Wt 172.2 lb

## 2021-12-19 DIAGNOSIS — M25551 Pain in right hip: Secondary | ICD-10-CM | POA: Diagnosis not present

## 2021-12-19 DIAGNOSIS — J3489 Other specified disorders of nose and nasal sinuses: Secondary | ICD-10-CM | POA: Diagnosis not present

## 2021-12-19 DIAGNOSIS — G8929 Other chronic pain: Secondary | ICD-10-CM | POA: Diagnosis not present

## 2021-12-19 NOTE — Progress Notes (Signed)
Acute Office Visit  Subjective:     Patient ID: Andre Estrada, male    DOB: 03-12-1940, 81 y.o.   MRN: [id number]  Chief Complaint  Patient presents with   Hip Pain    Hip Pain    Patient is in today for right hip pain. He was seen a week ago with his PCP for this. This is a chronic pain. It is constant and achy. It is moderate. Pain is aggravated by weight bearing and movement. He has tried NSAIDs and icy hot with mild improvement. He had a steroid IM injection and oral prednisone burst last week that did not help. He had xrays done last week that showed OA. Denies erythema, warmth, swelling, or injury.   He also reports a runny nose that started this morning. Denies cough, congestion, fever, chills, sore throat, ear pain, shortness of breath, or chest pain. He has not taken anything for his symptoms.   ROS As per HPI.      Objective:    BP 133/77   Pulse 77   Temp 98.6 F (37 C) (Temporal)   Ht 6' (1.829 m)   Wt 172 lb 4 oz (78.1 kg)   SpO2 98%   BMI 23.36 kg/m    Physical Exam Vitals and nursing note reviewed.  Constitutional:      General: He is not in acute distress.    Appearance: He is not ill-appearing, toxic-appearing or diaphoretic.  HENT:     Head: Normocephalic and atraumatic.     Right Ear: Tympanic membrane, ear canal and external ear normal.     Left Ear: Tympanic membrane, ear canal and external ear normal.     Nose: Rhinorrhea present.     Mouth/Throat:     Mouth: Mucous membranes are moist.     Pharynx: Oropharynx is clear. No oropharyngeal exudate or posterior oropharyngeal erythema.  Eyes:     General:        Right eye: No discharge.        Left eye: No discharge.     Conjunctiva/sclera: Conjunctivae normal.     Pupils: Pupils are equal, round, and reactive to light.  Cardiovascular:     Rate and Rhythm: Normal rate and regular rhythm.     Heart sounds: Normal heart sounds. No murmur heard. Pulmonary:     Effort: Pulmonary effort is  normal. No respiratory distress.     Breath sounds: Normal breath sounds. No wheezing, rhonchi or rales.  Skin:    General: Skin is warm and dry.  Neurological:     General: No focal deficit present.     Mental Status: He is alert and oriented to person, place, and time.  Psychiatric:        Mood and Affect: Mood normal.        Behavior: Behavior normal.     No results found for any visits on 12/19/21.      Assessment & Plan:   Andre Estrada was seen today for hip pain.  Diagnoses and all orders for this visit:  Chronic right hip pain Recently had steroid IM injection and prednisone burst. Xray last week with osteopenia and OA per report. Tylenol, heat, stretching prn. Referral placed to ortho today for further management.  -     Ambulatory referral to Orthopedic Surgery  Rhinorrhea Discussed xyzal or zyrtec prn. No other symptoms, benign exam.   Return if symptoms worsen or fail to improve.  The patient indicates understanding of these issues  and agrees with the plan.  Gwenlyn Perking, FNP

## 2021-12-19 NOTE — Patient Instructions (Signed)
[facility] [address] [place],  [place]  [address] Main: [phone]

## 2021-12-25 ENCOUNTER — Encounter: Payer: Self-pay | Admitting: Orthopaedic Surgery

## 2021-12-25 ENCOUNTER — Ambulatory Visit (INDEPENDENT_AMBULATORY_CARE_PROVIDER_SITE_OTHER): Payer: Medicare HMO | Admitting: Orthopaedic Surgery

## 2021-12-25 ENCOUNTER — Ambulatory Visit (INDEPENDENT_AMBULATORY_CARE_PROVIDER_SITE_OTHER): Payer: Medicare HMO

## 2021-12-25 VITALS — BP 145/87 | HR 62 | Ht 72.0 in | Wt 163.0 lb

## 2021-12-25 DIAGNOSIS — G8929 Other chronic pain: Secondary | ICD-10-CM

## 2021-12-25 DIAGNOSIS — M5441 Lumbago with sciatica, right side: Secondary | ICD-10-CM

## 2021-12-25 MED ORDER — PREDNISONE 5 MG (21) PO TBPK: ORAL_TABLET | ORAL | 0 refills | Status: DC

## 2021-12-25 NOTE — Patient Instructions (Signed)
START YOUR PRESCRIPTION DR.KEELING HAS SENT IN FOR YOU  BUY SOME ALEVE (NAPROXEN) AND TAKE TWICE A DAY , ONE TIME IN THE MORNING AND ONE IN THE EVENING. YOU MUST EAT BEFORE YOU TAKE THIS MEDICATION. IT WILL TAKE SEVERAL DAYS TO BUILD UP IN YOUR SYSTEM.   Dr.Keeling is here all day on Tuesdays, Wednesday mornings, and Thursday mornings. If you need anything such as a medication refill, please either call BEFORE the end of the day on [facility] or send a message through [place]. Your pharmacy can send a refill request for you. Calling by the end of the day on [facility] allows [place] time to send Dr.Keeling the request and for him to respond before he leaves on Thursdays.  If Dr. Luna Glasgow is out of the office, we may send it to one of the other providers and they may not refill it for the same amount that your original prescription is for.   As the weather changes and gets cooler, you may notice you are affected more. You may have more pain in your joints. This is normal. Dress warmly and make sure that area is covered well.

## 2021-12-25 NOTE — Progress Notes (Signed)
Subjective:    Patient ID: Andre Estrada, male    DOB: Dec 21, 1940, 81 y.o.   MRN: [id number]  HPI He complains of hip pain on the right and lower back pain on the right for several months getting worse.  He was seen at [facility] for this 12-19-21.  I have read the notes.    He has no trauma.  He has no weakness.  Pain goes from the back to the right lateral hip and posteriorly just past the right knee.  He has no swelling, no redness.  He has no edema.  He was given prednisone injection which helped about three days.   Review of Systems  Constitutional:  Positive for activity change.  Musculoskeletal:  Positive for arthralgias, back pain, gait problem and myalgias.  All other systems reviewed and are negative. For Review of Systems, all other systems reviewed and are negative.  The following is a summary of the past history medically, past history surgically, known current medicines, social history and family history.  This information is gathered electronically by the computer from prior information and documentation.  I review this each visit and have found including this information at this point in the chart is beneficial and informative.   Past Medical History:  Diagnosis Date   Fracture of thumb    left   Hyperlipidemia    Hypertension    Melanoma ([place])    BACK AND STOMACH    Prostate enlargement    Sinusitis    Tobacco dependence     Past Surgical History:  Procedure Laterality Date   LYMPH NODE BIOPSY     MELANOMA EXCISION     PARTIAL COLECTOMY     SPLENECTOMY, TOTAL      Current Outpatient Medications on File Prior to Visit  Medication Sig Dispense Refill   aspirin 81 MG tablet Take 81 mg by mouth daily.     finasteride (PROSCAR) 5 MG tablet Take 1 tablet (5 mg total) by mouth daily. 90 tablet 3   hydrocortisone 1 % lotion Apply 1 application topically 2 (two) times daily. 118 mL 1   lisinopril-hydrochlorothiazide (ZESTORETIC) 10-12.5 MG tablet Take 1  tablet by mouth daily. 90 tablet 1   simvastatin (ZOCOR) 40 MG tablet Take 1 tablet (40 mg total) by mouth at bedtime. 90 tablet 1   triamcinolone cream (KENALOG) 0.1 % Apply 1 application topically 2 (two) times daily. 453.6 g 1   No current facility-administered medications on file prior to visit.    Social History   Socioeconomic History   Marital status: Widowed    Spouse name: Not on file   Number of children: 1   Years of education: 9   Highest education level: 9th grade  Occupational History   Occupation: retired  Tobacco Use   Smoking status: Every Day    Packs/day: 1.00    Years: 60.00    Total pack years: 60.00    Types: Cigarettes   Smokeless tobacco: Never  Vaping Use   Vaping Use: Never used  Substance and Sexual Activity   Alcohol use: No   Drug use: No   Sexual activity: Not Currently  Other Topics Concern   Not on file  Social History Narrative   Son lives with him   He's still very independent   Social Determinants of Health   Financial Resource Strain: Medium Risk (03/08/2021)   Overall Financial Resource Strain (CARDIA)    Difficulty of Paying Living Expenses: Somewhat hard  Food Insecurity: No Food Insecurity (03/08/2021)   Hunger Vital Sign    Worried About Running Out of Food in the Last Year: Never true    Ran Out of Food in the Last Year: Never true  Transportation Needs: No Transportation Needs (03/08/2021)   PRAPARE - Hydrologist (Medical): No    Lack of Transportation (Non-Medical): No  Physical Activity: Insufficiently Active (03/08/2021)   Exercise Vital Sign    Days of Exercise per Week: 7 days    Minutes of Exercise per Session: 20 min  Stress: No Stress Concern Present (03/08/2021)   [place]    Feeling of Stress : Not at all  Social Connections: Socially Isolated (03/08/2021)   Social Connection and Isolation Panel [NHANES]    Frequency  of Communication with Friends and Family: More than three times a week    Frequency of Social Gatherings with Friends and Family: More than three times a week    Attends Religious Services: Never    Marine scientist or Organizations: No    Attends Archivist Meetings: Never    Marital Status: Widowed  Intimate Partner Violence: Not At Risk (03/08/2021)   Humiliation, Afraid, Rape, and Kick questionnaire    Fear of Current or Ex-Partner: No    Emotionally Abused: No    Physically Abused: No    Sexually Abused: No    Family History  Problem Relation Age of Onset   Coronary artery disease Father    COPD Father    Prostatitis Brother    Diabetes Son    Prostatitis Brother    Alcohol abuse Brother     BP (!) 145/87   Pulse 62   Ht 6' (1.829 m)   Wt 163 lb (73.9 kg)   BMI 22.11 kg/m   Body mass index is 22.11 kg/m.      Objective:   Physical Exam Vitals and nursing note reviewed. Exam conducted with a chaperone present.  Constitutional:      Appearance: He is well-developed.  HENT:     Head: Normocephalic and atraumatic.  Eyes:     Conjunctiva/sclera: Conjunctivae normal.     Pupils: Pupils are equal, round, and reactive to light.  Cardiovascular:     Rate and Rhythm: Normal rate and regular rhythm.  Pulmonary:     Effort: Pulmonary effort is normal.  Abdominal:     Palpations: Abdomen is soft.  Musculoskeletal:       Arms:     Cervical back: Normal range of motion and neck supple.  Skin:    General: Skin is warm and dry.  Neurological:     Mental Status: He is alert and oriented to person, place, and time.     Cranial Nerves: No cranial nerve deficit.     Motor: No abnormal muscle tone.     Coordination: Coordination normal.     Deep Tendon Reflexes: Reflexes are normal and symmetric. Reflexes normal.  Psychiatric:        Behavior: Behavior normal.        Thought Content: Thought content normal.        Judgment: Judgment normal.   X-rays  were done of the lumbar spine, reported separately.        Assessment & Plan:   Encounter Diagnosis  Name Primary?   Chronic right-sided low back pain with right-sided sciatica Yes   I will begin prednisone dose  pack.  After it is finished, begin Aleve one bid.  Return in two weeks.  He may need MRI of the lumbar spine.  I have independently reviewed and interpreted x-rays of this patient done at another site by another physician or qualified health professional.  Call if any problem.  Precautions discussed.  Electronically Signed Sanjuana Kava, MD 11/7/202311:31 AM

## 2022-01-08 ENCOUNTER — Other Ambulatory Visit: Payer: Medicare HMO

## 2022-01-08 ENCOUNTER — Ambulatory Visit: Payer: Medicare HMO | Admitting: Orthopaedic Surgery

## 2022-01-08 ENCOUNTER — Encounter: Payer: Self-pay | Admitting: Orthopaedic Surgery

## 2022-01-08 VITALS — Ht 73.0 in | Wt 163.0 lb

## 2022-01-08 DIAGNOSIS — M5441 Lumbago with sciatica, right side: Secondary | ICD-10-CM | POA: Diagnosis not present

## 2022-01-08 DIAGNOSIS — C61 Malignant neoplasm of prostate: Secondary | ICD-10-CM | POA: Diagnosis not present

## 2022-01-08 DIAGNOSIS — G8929 Other chronic pain: Secondary | ICD-10-CM

## 2022-01-08 MED ORDER — HYDROCODONE-ACETAMINOPHEN 5-325 MG PO TABS: 1.0000 | ORAL_TABLET | ORAL | 0 refills | Status: AC | PRN

## 2022-01-08 NOTE — Progress Notes (Signed)
My back and hip are hurting more.  The prednisone dose pack did not help.   He has pain in the lower back that goes to the right hip area.  He has no new trauma.  Spine/Pelvis examination:  Inspection:  Overall, sacoiliac joint benign and hips nontender; without crepitus or defects.   Thoracic spine inspection: Alignment normal without kyphosis present   Lumbar spine inspection:  Alignment  with normal lumbar lordosis, without scoliosis apparent.   Thoracic spine palpation:  without tenderness of spinal processes   Lumbar spine palpation: without tenderness of lumbar area; without tightness of lumbar muscles    Range of Motion:   Lumbar flexion, forward flexion is normal without pain or tenderness    Lumbar extension is full without pain or tenderness   Left lateral bend is normal without pain or tenderness   Right lateral bend is normal without pain or tenderness   Straight leg raising is normal  Strength & tone: normal   Stability overall normal stability  Encounter Diagnosis  Name Primary?   Chronic right-sided low back pain with right-sided sciatica Yes   I will get MRI of the lumbar spine.  I will call in [place].  I have reviewed the [place] web site prior to prescribing narcotic medicine for this patient.  Return in two weeks.  Take the Aleve.  Call if any problem.  Precautions discussed.  Electronically Signed Sanjuana Kava, MD 11/21/20238:47 AM

## 2022-01-08 NOTE — Patient Instructions (Signed)
 Central Scheduling [phone]  While we are working on your approval please go ahead and call to schedule your appointment to be done within one week. If you can not get an appointment at [facility] within the next week, ask if they have something sooner at [facility] or [place] if you are able to go to [place] to have the imaging done.  AFTER you have made your imaging appointment, please call our office back at [phone] to schedule an appointment to review your results.   CURRENTLY [employer] ONLY AUTHORIZES IMAGING TO BE PERFORMED AT [facility]. THEY WILL NOT APPROVE ANY OTHER FACILITY.  [facility] Address: [address], [place], [place] [address] Phone: [phone]   [place], [place] [address] Phone: [phone]  We will see you in two weeks to review that MRI of your lower back.   Dr.Keeling is here all day on Tuesdays, Wednesday mornings, and Thursday mornings. If you need anything such as a medication refill, please either call BEFORE the end of the day on [facility] or send a message through [place]. Your pharmacy can send a refill request for you. Calling by the end of the day on [facility] allows [place] time to send Dr.Keeling the request and for him to respond before he leaves on Thursdays.  If Dr. Luna Glasgow is out of the office, we may send it to one of the other providers and they may not refill it for the same amount that your original prescription is for.  MY NAME IS  AND I AM DR.KEELING'S ASSISTANT. IF YOU NEED ANYTHING, PLEASE DO NOT HESITATE TO EITHER SEND ME A MESSAGE VIA MYCHART OR CALL THE OFFICE [phone] AND LEAVE A MESSAGE FOR ME. I WILL RESPOND WITHIN 24-48 BUSINESS HOURS.

## 2022-01-09 ENCOUNTER — Other Ambulatory Visit: Payer: Medicare HMO

## 2022-01-09 LAB — PSA: Prostate Specific Ag, Serum: 5.4 ng/mL — ABNORMAL HIGH (ref 0.0–4.0)

## 2022-01-11 ENCOUNTER — Ambulatory Visit (HOSPITAL_COMMUNITY)
Admission: RE | Admit: 2022-01-11 | Discharge: 2022-01-11 | Disposition: A | Discharge status: Home / Self Care | Payer: Medicare HMO | Source: Ambulatory Visit | Attending: Orthopaedic Surgery | Admitting: Orthopaedic Surgery

## 2022-01-11 DIAGNOSIS — M5441 Lumbago with sciatica, right side: Secondary | ICD-10-CM | POA: Insufficient documentation

## 2022-01-11 DIAGNOSIS — M5126 Other intervertebral disc displacement, lumbar region: Secondary | ICD-10-CM | POA: Diagnosis not present

## 2022-01-11 DIAGNOSIS — G8929 Other chronic pain: Secondary | ICD-10-CM | POA: Diagnosis not present

## 2022-01-11 DIAGNOSIS — M545 Low back pain, unspecified: Secondary | ICD-10-CM | POA: Diagnosis not present

## 2022-01-16 ENCOUNTER — Encounter: Payer: Self-pay | Admitting: Orthopaedic Surgery

## 2022-01-16 ENCOUNTER — Ambulatory Visit: Payer: Medicare HMO | Admitting: Orthopaedic Surgery

## 2022-01-16 DIAGNOSIS — G8929 Other chronic pain: Secondary | ICD-10-CM

## 2022-01-16 DIAGNOSIS — M5441 Lumbago with sciatica, right side: Secondary | ICD-10-CM

## 2022-01-16 DIAGNOSIS — M5116 Intervertebral disc disorders with radiculopathy, lumbar region: Secondary | ICD-10-CM | POA: Diagnosis not present

## 2022-01-16 MED ORDER — HYDROCODONE-ACETAMINOPHEN 7.5-325 MG PO TABS: 1.0000 | ORAL_TABLET | ORAL | 0 refills | Status: DC | PRN

## 2022-01-16 NOTE — Progress Notes (Signed)
I hurt more  He has continued lower back pain.  MRI showed: IMPRESSION: 1. Evaluation is limited by motion. 2. L2-L3 right subarticular and foraminal disc extrusion with cranial migration, which occupies the majority of the right neural foramen, resulting in severe right neural foraminal narrowing. Moderate spinal canal stenosis with mild left neural foraminal narrowing. Effacement of the lateral recesses at this level likely compresses the descending L3 nerve roots. 3. L1-L2 mild spinal canal stenosis. Narrowing of the lateral recesses at this level could affect the descending L2 nerve roots. 4. L3-L4 moderate to severe left neural foraminal narrowing. Narrowing of the left-greater-than-right lateral recess at this level could affect the descending L4 nerve roots. 5. L4-L5 moderate to severe right and mild left neural foraminal narrowing. Narrowing of the lateral recesses at this level could affect the descending L5 nerve roots. 6. L5-S1 mild bilateral neural foraminal narrowing.  I have independently reviewed the MRI.    I have explained the findings to him.  I will have him see neurosurgery.  He has lower back pain, more on right with no spasm.  ROM is good but painful.  He has no weakness.  SLR weakly positive 30 degrees right.  Gait is good.  Reflexes intact.  Encounter Diagnoses  Name Primary?   Chronic right-sided low back pain with right-sided sciatica Yes   Lumbar disc disease with radiculopathy    To neurosurgery.  I have reviewed the [place] web site prior to prescribing narcotic medicine for this patient.  Call if any problem.  Precautions discussed.  Electronically Signed Sanjuana Kava, MD 11/29/20238:44 AM

## 2022-01-16 NOTE — Patient Instructions (Addendum)
 You have been referred to [facility] on [address] in [place]. They will call you to schedule the appointment. If you have not heard anything in one week call them to schedule [phone]. This referral has to be uploaded to another system because they are not on the same medical records system as we are. Please allow [place] two business days to get this uploaded for you.    IF YOU HAVE NOT HEARD FROM THEM BY FRIDAY MORNING, CALL HERE AT [phone] AND LEAVE A MESSAGE FOR ME SO I CAN REACH OUT TO THEM FOR YOU.   My name is  and I assist Ione. If you need anything before your next appointment, please do not hesitate to call the office at [phone] and ask to leave a message for me. I will respond within 24-48 business hours.   [facility] Address: [address] [place], [place], [place] [address] Phone: [phone]

## 2022-01-17 ENCOUNTER — Ambulatory Visit: Payer: Medicare HMO | Admitting: Urology

## 2022-01-17 DIAGNOSIS — M5416 Radiculopathy, lumbar region: Secondary | ICD-10-CM | POA: Diagnosis not present

## 2022-01-18 ENCOUNTER — Telehealth: Payer: Self-pay | Admitting: Orthopaedic Surgery

## 2022-01-18 NOTE — Telephone Encounter (Signed)
Pt's son, Delfino Lovett lvm Delfino Lovett, pt wants to ask some questions about going to neurosurgeon, pt's # [phone]

## 2022-01-21 ENCOUNTER — Telehealth: Payer: Self-pay | Admitting: Orthopaedic Surgery

## 2022-01-21 NOTE — Telephone Encounter (Signed)
Richard [facility], pt's son [phone]) called, lvm stating Dr. Luna Glasgow sent him to [facility] and they didn't do anything for him, wants to speak to someone.

## 2022-01-21 NOTE — Telephone Encounter (Signed)
Spoke with patients son. He stated that Andre Estrada saw Dr.Ostergard last week. Son states that Dr.Ostergard will not do surgery or an injection. Asking to be referred to someone else. Pain medication is not helping at all.

## 2022-01-22 MED ORDER — HYDROCODONE-ACETAMINOPHEN 7.5-325 MG PO TABS: 1.0000 | ORAL_TABLET | Freq: Four times a day (QID) | ORAL | 0 refills | Status: AC | PRN

## 2022-01-22 NOTE — Telephone Encounter (Signed)
Spoke with son and advised him of what [facility] notes stated. Explained to him that they will call once they have everything they need for surgery and set him up to have it. Son states that patient is still in a lot of pain 10/10 on scale. Would like to know if he can have something stronger for the pain or at the least a refill on previously prescribed medication.   [facility] in [place].

## 2022-01-30 ENCOUNTER — Telehealth: Payer: Self-pay | Admitting: Orthopaedic Surgery

## 2022-01-30 ENCOUNTER — Other Ambulatory Visit: Payer: Self-pay

## 2022-01-30 NOTE — Telephone Encounter (Signed)
Patient's son Delfino Lovett called and lvm requesting a refill on his Hydrocodone 7.5-325, 28 tablets to be called to [facility].  Pt's son Richard's phone # [phone]

## 2022-01-31 MED ORDER — HYDROCODONE-ACETAMINOPHEN 7.5-325 MG PO TABS: 1.0000 | ORAL_TABLET | Freq: Four times a day (QID) | ORAL | 0 refills | Status: DC | PRN

## 2022-02-07 ENCOUNTER — Ambulatory Visit: Payer: Medicare HMO | Admitting: Urology

## 2022-02-13 ENCOUNTER — Other Ambulatory Visit: Payer: Self-pay | Admitting: Orthopaedic Surgery

## 2022-02-13 MED ORDER — HYDROCODONE-ACETAMINOPHEN 7.5-325 MG PO TABS: 1.0000 | ORAL_TABLET | Freq: Four times a day (QID) | ORAL | 0 refills | Status: DC | PRN

## 2022-02-13 NOTE — Telephone Encounter (Signed)
Refill request sent to [facility].

## 2022-02-13 NOTE — Telephone Encounter (Signed)
Patient's son Delfino Lovett [phone]) called on 02/12/22 at 9:12am, lvm requesting a refill for Hydrocodone 7.5-325, 28 quantity, 1 every 6 hours be sent to [facility] in [place].

## 2022-02-19 DIAGNOSIS — M5416 Radiculopathy, lumbar region: Secondary | ICD-10-CM | POA: Diagnosis not present

## 2022-02-19 DIAGNOSIS — M5116 Intervertebral disc disorders with radiculopathy, lumbar region: Secondary | ICD-10-CM | POA: Diagnosis not present

## 2022-02-19 DIAGNOSIS — M5126 Other intervertebral disc displacement, lumbar region: Secondary | ICD-10-CM | POA: Diagnosis not present

## 2022-03-11 ENCOUNTER — Ambulatory Visit (INDEPENDENT_AMBULATORY_CARE_PROVIDER_SITE_OTHER): Payer: Medicare HMO

## 2022-03-11 VITALS — Ht 72.0 in | Wt 172.0 lb

## 2022-03-11 DIAGNOSIS — Z0000 Encounter for general adult medical examination without abnormal findings: Secondary | ICD-10-CM | POA: Diagnosis not present

## 2022-03-11 NOTE — Patient Instructions (Signed)
Andre Estrada , Thank you for taking time to come for your Medicare Wellness Visit. I appreciate your ongoing commitment to your health goals. Please review the following plan we discussed and let me know if I can assist you in the future.   These are the goals we discussed:  Goals      DIET - INCREASE WATER INTAKE     Try to drink 6-8 glasses of water daily     Exercise 3x per week (30 min per time)        This is a list of the screening recommended for you and due dates:  Health Maintenance  Topic Date Due   Screening for Lung Cancer  Never done   COVID-19 Vaccine (4 - 2023-24 season) 10/19/2021   Flu Shot  05/19/2022*   Medicare Annual Wellness Visit  03/12/2023   DTaP/Tdap/Td vaccine (2 - Td or Tdap) 10/26/2030   Pneumonia Vaccine  Completed   Zoster (Shingles) Vaccine  Completed   HPV Vaccine  Aged Out  *Topic was postponed. The date shown is not the original due date.    Advanced directives: Advance directive discussed with you today. I have provided a copy for you to complete at home and have notarized. Once this is complete please bring a copy in to our office so we can scan it into your chart.   Conditions/risks identified: Aim for 30 minutes of exercise or brisk walking, 6-8 glasses of water, and 5 servings of fruits and vegetables each day.   Next appointment: Follow up in one year for your annual wellness visit.   Preventive Care 77 Years and Older, Male  Preventive care refers to lifestyle choices and visits with your health care provider that can promote health and wellness. What does preventive care include? A yearly physical exam. This is also called an annual well check. Dental exams once or twice a year. Routine eye exams. Ask your health care provider how often you should have your eyes checked. Personal lifestyle choices, including: Daily care of your teeth and gums. Regular physical activity. Eating a healthy diet. Avoiding tobacco and drug use. Limiting  alcohol use. Practicing safe sex. Taking low doses of aspirin every day. Taking vitamin and mineral supplements as recommended by your health care provider. What happens during an annual well check? The services and screenings done by your health care provider during your annual well check will depend on your age, overall health, lifestyle risk factors, and family history of disease. Counseling  Your health care provider may ask you questions about your: Alcohol use. Tobacco use. Drug use. Emotional well-being. Home and relationship well-being. Sexual activity. Eating habits. History of falls. Memory and ability to understand (cognition). Work and work Statistician. Screening  You may have the following tests or measurements: Height, weight, and BMI. Blood pressure. Lipid and cholesterol levels. These may be checked every 5 years, or more frequently if you are over 98 years old. Skin check. Lung cancer screening. You may have this screening every year starting at age 19 if you have a 30-pack-year history of smoking and currently smoke or have quit within the past 15 years. Fecal occult blood test (FOBT) of the stool. You may have this test every year starting at age 5. Flexible sigmoidoscopy or colonoscopy. You may have a sigmoidoscopy every 5 years or a colonoscopy every 10 years starting at age 80. Prostate cancer screening. Recommendations will vary depending on your family history and other risks. Hepatitis C blood  test. Hepatitis B blood test. Sexually transmitted disease (STD) testing. Diabetes screening. This is done by checking your blood sugar (glucose) after you have not eaten for a while (fasting). You may have this done every 1-3 years. Abdominal aortic aneurysm (AAA) screening. You may need this if you are a current or former smoker. Osteoporosis. You may be screened starting at age 31 if you are at high risk. Talk with your health care provider about your test results,  treatment options, and if necessary, the need for more tests. Vaccines  Your health care provider may recommend certain vaccines, such as: Influenza vaccine. This is recommended every year. Tetanus, diphtheria, and acellular pertussis (Tdap, Td) vaccine. You may need a Td booster every 10 years. Zoster vaccine. You may need this after age 70. Pneumococcal 13-valent conjugate (PCV13) vaccine. One dose is recommended after age 80. Pneumococcal polysaccharide (PPSV23) vaccine. One dose is recommended after age 74. Talk to your health care provider about which screenings and vaccines you need and how often you need them. This information is not intended to replace advice given to you by your health care provider. Make sure you discuss any questions you have with your health care provider. Document Released: 03/03/2015 Document Revised: 10/25/2015 Document Reviewed: 12/06/2014 Elsevier Interactive Patient Education  2017 [employer] Prevention in the Home Falls can cause injuries. They can happen to people of all ages. There are many things you can do to make your home safe and to help prevent falls. What can I do on the outside of my home? Regularly fix the edges of walkways and driveways and fix any cracks. Remove anything that might make you trip as you walk through a door, such as a raised step or threshold. Trim any bushes or trees on the path to your home. Use bright outdoor lighting. Clear any walking paths of anything that might make someone trip, such as rocks or tools. Regularly check to see if handrails are loose or broken. Make sure that both sides of any steps have handrails. Any raised decks and porches should have guardrails on the edges. Have any leaves, snow, or ice cleared regularly. Use sand or salt on walking paths during winter. Clean up any spills in your garage right away. This includes oil or grease spills. What can I do in the bathroom? Use night  lights. Install grab bars by the toilet and in the tub and shower. Do not use towel bars as grab bars. Use non-skid mats or decals in the tub or shower. If you need to sit down in the shower, use a plastic, non-slip stool. Keep the floor dry. Clean up any water that spills on the floor as soon as it happens. Remove soap buildup in the tub or shower regularly. Attach bath mats securely with double-sided non-slip rug tape. Do not have throw rugs and other things on the floor that can make you trip. What can I do in the bedroom? Use night lights. Make sure that you have a light by your bed that is easy to reach. Do not use any sheets or blankets that are too big for your bed. They should not hang down onto the floor. Have a firm chair that has side arms. You can use this for support while you get dressed. Do not have throw rugs and other things on the floor that can make you trip. What can I do in the kitchen? Clean up any spills right away. Avoid walking on wet  floors. Keep items that you use a lot in easy-to-reach places. If you need to reach something above you, use a strong step stool that has a grab bar. Keep electrical cords out of the way. Do not use floor polish or wax that makes floors slippery. If you must use wax, use non-skid floor wax. Do not have throw rugs and other things on the floor that can make you trip. What can I do with my stairs? Do not leave any items on the stairs. Make sure that there are handrails on both sides of the stairs and use them. Fix handrails that are broken or loose. Make sure that handrails are as long as the stairways. Check any carpeting to make sure that it is firmly attached to the stairs. Fix any carpet that is loose or worn. Avoid having throw rugs at the top or bottom of the stairs. If you do have throw rugs, attach them to the floor with carpet tape. Make sure that you have a light switch at the top of the stairs and the bottom of the stairs. If  you do not have them, ask someone to add them for you. What else can I do to help prevent falls? Wear shoes that: Do not have high heels. Have rubber bottoms. Are comfortable and fit you well. Are closed at the toe. Do not wear sandals. If you use a stepladder: Make sure that it is fully opened. Do not climb a closed stepladder. Make sure that both sides of the stepladder are locked into place. Ask someone to hold it for you, if possible. Clearly mark and make sure that you can see: Any grab bars or handrails. First and last steps. Where the edge of each step is. Use tools that help you move around (mobility aids) if they are needed. These include: Canes. Walkers. Scooters. Crutches. Turn on the lights when you go into a dark area. Replace any light bulbs as soon as they burn out. Set up your furniture so you have a clear path. Avoid moving your furniture around. If any of your floors are uneven, fix them. If there are any pets around you, be aware of where they are. Review your medicines with your doctor. Some medicines can make you feel dizzy. This can increase your chance of falling. Ask your doctor what other things that you can do to help prevent falls. This information is not intended to replace advice given to you by your health care provider. Make sure you discuss any questions you have with your health care provider. Document Released: 12/01/2008 Document Revised: 07/13/2015 Document Reviewed: 03/11/2014 Elsevier Interactive Patient Education  2017 [employer].

## 2022-03-11 NOTE — Progress Notes (Signed)
Subjective:   Andre Estrada is a 82 y.o. male who presents for Medicare Annual/Subsequent preventive examination.  I connected with  [facility] on 03/11/22 by a audio enabled telemedicine application and verified that I am speaking with the correct person using two identifiers.  Patient Location: Home  Provider Location: Home Office  I discussed the limitations of evaluation and management by telemedicine. The patient expressed understanding and agreed to proceed.  Review of Systems     Cardiac Risk Factors include: advanced age (>61mn, >>34women);male gender;hypertension;diabetes mellitus;smoking/ tobacco exposure     Objective:    Today's Vitals   03/11/22 1127  Weight: 172 lb (78 kg)  Height: 6' (1.829 m)   Body mass index is 23.33 kg/m.     03/11/2022    1:32 PM 04/14/2019    2:03 PM 10/02/2017    8:57 AM 06/06/2017    7:46 AM 02/14/2014   10:16 AM  Advanced Directives  Does Patient Have a Medical Advance Directive? No No No;Yes No Yes  Type of Advance Directive   Living will    Copy of [place] Chart?     No - copy requested  Would patient like information on creating a medical advance directive? Yes (MAU/Ambulatory/Procedural Areas - Information given) No - Patient declined  No - Patient declined     Current Medications (verified) Outpatient Encounter Medications as of 03/11/2022  Medication Sig   aspirin 81 MG tablet Take 81 mg by mouth daily.   finasteride (PROSCAR) 5 MG tablet Take 1 tablet (5 mg total) by mouth daily.   hydrocortisone 1 % lotion Apply 1 application topically 2 (two) times daily.   lisinopril-hydrochlorothiazide (ZESTORETIC) 10-12.5 MG tablet Take 1 tablet by mouth daily.   simvastatin (ZOCOR) 40 MG tablet Take 1 tablet (40 mg total) by mouth at bedtime.   triamcinolone cream (KENALOG) 0.1 % Apply 1 application topically 2 (two) times daily.   [DISCONTINUED] HYDROcodone-acetaminophen (NORCO) 7.5-325 MG tablet  Take 1 tablet by mouth every 6 (six) hours as needed for moderate pain.   [DISCONTINUED] predniSONE (STERAPRED UNI-PAK 21 TAB) 5 MG (21) TBPK tablet Take 6 pills first day; 5 pills second day; 4 pills third day; 3 pills fourth day; 2 pills next day and 1 pill last day.   No facility-administered encounter medications on file as of 03/11/2022.    Allergies (verified) Sulfa antibiotics   History: Past Medical History:  Diagnosis Date   Fracture of thumb    left   Hyperlipidemia    Hypertension    [place]    BACK AND STOMACH    Prostate enlargement    Sinusitis    Tobacco dependence    Past Surgical History:  Procedure Laterality Date   LYMPH NODE BIOPSY     MELANOMA EXCISION     PARTIAL COLECTOMY     SPLENECTOMY, TOTAL     Family History  Problem Relation Age of Onset   Coronary artery disease Father    COPD Father    Prostatitis Brother    Diabetes Son    Prostatitis Brother    Alcohol abuse Brother    Social History   Socioeconomic History   Marital status: Widowed    Spouse name: Not on file   Number of children: 1   Years of education: 9   Highest education level: 9th grade  Occupational History   Occupation: retired  Tobacco Use   Smoking status: Every Day  Packs/day: 1.00    Years: 60.00    Total pack years: 60.00    Types: Cigarettes   Smokeless tobacco: Never  Vaping Use   Vaping Use: Never used  Substance and Sexual Activity   Alcohol use: No   Drug use: No   Sexual activity: Not Currently  Other Topics Concern   Not on file  Social History Narrative   Son lives with him   He's still very independent   Social Determinants of Health   Financial Resource Strain: Low Risk  (03/11/2022)   Overall Financial Resource Strain (CARDIA)    Difficulty of Paying Living Expenses: Not hard at all  Food Insecurity: No Food Insecurity (03/11/2022)   Hunger Vital Sign    Worried About Running Out of Food in the Last Year: Never true    Ran Out of  Food in the Last Year: Never true  Transportation Needs: No Transportation Needs (03/11/2022)   PRAPARE - Hydrologist (Medical): No    Lack of Transportation (Non-Medical): No  Physical Activity: Sufficiently Active (03/11/2022)   Exercise Vital Sign    Days of Exercise per Week: 5 days    Minutes of Exercise per Session: 30 min  Stress: No Stress Concern Present (03/11/2022)   [place]    Feeling of Stress : Not at all  Social Connections: Socially Isolated (03/11/2022)   Social Connection and Isolation Panel [NHANES]    Frequency of Communication with Friends and Family: More than three times a week    Frequency of Social Gatherings with Friends and Family: Twice a week    Attends Religious Services: Never    Marine scientist or Organizations: No    Attends Archivist Meetings: Never    Marital Status: Widowed    Tobacco Counseling Ready to quit: Not Answered Counseling given: Not Answered   Clinical Intake:  Pre-visit preparation completed: Yes  Pain : No/denies pain  Diabetes: No  How often do you need to have someone help you when you read instructions, pamphlets, or other written materials from your doctor or pharmacy?: 1 - Never  Diabetic?No   Interpreter Needed?: No  Information entered by :: Denman George LPN   Activities of Daily Living    03/11/2022    1:32 PM  In your present state of health, do you have any difficulty performing the following activities:  Hearing? 0  Vision? 0  Difficulty concentrating or making decisions? 0  Walking or climbing stairs? 0  Dressing or bathing? 0  Doing errands, shopping? 0  Preparing Food and eating ? N  Using the Toilet? N  In the past six months, have you accidently leaked urine? N  Do you have problems with loss of bowel control? N  Managing your Medications? N  Managing your Finances? N   Housekeeping or managing your Housekeeping? N    Patient Care Team: Chevis Pretty, FNP as PCP - General (Nurse Practitioner)  Indicate any recent Medical Services you may have received from other than [facility] providers in the past year (date may be approximate).     Assessment:   This is a routine wellness examination for Andre Estrada.  Hearing/Vision screen Hearing Screening - Comments:: Some hearing problems noted   Vision Screening - Comments:: No vision problems; plans to schedule routine eye exam this year   Dietary issues and exercise activities discussed: Current Exercise Habits: Home exercise routine, Type  of exercise: walking, Time (Minutes): 30, Frequency (Times/Week): 5, Weekly Exercise (Minutes/Week): 150, Intensity: Mild   Goals Addressed   None    Depression Screen    03/11/2022    1:30 PM 12/19/2021    8:18 AM 12/10/2021    9:04 AM 10/05/2021    8:05 AM 04/05/2021    8:04 AM 03/08/2021   11:51 AM 11/03/2020   12:47 PM  PHQ 2/9 Scores  PHQ - 2 Score 0 0 0 0 0 0 0  PHQ- 9 Score  0 0 0 0      Fall Risk    03/11/2022    1:29 PM 12/19/2021    8:18 AM 12/10/2021    9:04 AM 10/05/2021    8:05 AM 04/05/2021    8:04 AM  Fall Risk   Falls in the past year? 0 0 0 0 0  Number falls in past yr: 0      Injury with Fall? 0      Follow up Falls prevention discussed;Education provided;Falls evaluation completed        FALL RISK PREVENTION PERTAINING TO THE HOME:  Any stairs in or around the home? Yes  If so, are there any without handrails? No  Home free of loose throw rugs in walkways, pet beds, electrical cords, etc? Yes  Adequate lighting in your home to reduce risk of falls? Yes   ASSISTIVE DEVICES UTILIZED TO PREVENT FALLS:  Life alert? No  Use of a cane, walker or w/c? No  Grab bars in the bathroom? Yes  Shower chair or bench in shower? No  Elevated toilet seat or a handicapped toilet? Yes   TIMED UP AND GO:  Was the test performed? No . Telephonic  visit   Cognitive Function:    10/02/2017    9:03 AM  MMSE - Mini Mental State Exam  Orientation to time 5  Orientation to Place 5  Registration 3  Attention/ Calculation 4  Recall 2  Language- name 2 objects 2  Language- repeat 1  Language- follow 3 step command 3  Language- read & follow direction 1  Write a sentence 1  Copy design 1  Total score 28        03/11/2022    1:33 PM 03/08/2021   11:38 AM 04/14/2019    2:07 PM  6CIT Screen  What Year? 0 points 0 points 0 points  What month? 0 points 0 points 0 points  What time? 0 points 0 points 0 points  Count back from 20 0 points 0 points 0 points  Months in reverse 2 points 4 points 4 points  Repeat phrase 4 points 6 points 4 points  Total Score 6 points 10 points 8 points    Immunizations Immunization History  Administered Date(s) Administered   Fluad Quad(high Dose 65+) 11/03/2018   Influenza, High Dose Seasonal PF 10/14/2014, 10/29/2016   Influenza,inj,Quad PF,6+ Mos 11/17/2013, 12/02/2016   Influenza-Unspecified 10/07/2012, 12/17/2017, 10/25/2020   Moderna SARS-COV2 Booster Vaccination 12/12/2019, 05/25/2020   Moderna Sars-Covid-2 Vaccination 03/31/2019, 04/28/2019, 11/14/2020   Pneumococcal Conjugate-13 05/18/2014   Pneumococcal Polysaccharide-23 02/18/2010, 10/29/2016   Tdap 10/25/2020   Zoster Recombinat (Shingrix) 11/12/2018, 02/07/2019    TDAP status: Up to date  Flu Vaccine status: Up to date  Pneumococcal vaccine status: Up to date  Covid-19 vaccine status: Information provided on how to obtain vaccines.   Qualifies for Shingles Vaccine? Yes   Zostavax completed No   Shingrix Completed?: No.    Education  has been provided regarding the importance of this vaccine. Patient has been advised to call insurance company to determine out of pocket expense if they have not yet received this vaccine. Advised may also receive vaccine at local pharmacy or Health Dept. Verbalized acceptance and  understanding.  Screening Tests Health Maintenance  Topic Date Due   COVID-19 Vaccine (4 - 2023-24 season) 10/19/2021   INFLUENZA VACCINE  05/19/2022 (Originally 09/18/2021)   Medicare Annual Wellness (AWV)  03/12/2023   DTaP/Tdap/Td (2 - Td or Tdap) 10/26/2030   Pneumonia Vaccine 71+ Years old  Completed   Zoster Vaccines- Shingrix  Completed   HPV VACCINES  Aged Out   Lung Cancer Screening  Discontinued    Health Maintenance  Health Maintenance Due  Topic Date Due   COVID-19 Vaccine (4 - 2023-24 season) 10/19/2021    Colorectal cancer screening: No longer required.   Lung Cancer Screening: (Low Dose CT Chest recommended if Age 38-80 years, 30 pack-year currently smoking OR have quit w/in 15years.) does not qualify.   Lung Cancer Screening Referral: n/a   Additional Screening:  Hepatitis C Screening: does not qualify  Vision Screening: Recommended annual ophthalmology exams for early detection of glaucoma and other disorders of the eye. Is the patient up to date with their annual eye exam?  No  Who is the provider or what is the name of the office in which the patient attends annual eye exams? None  If pt is not established with a provider, would they like to be referred to a provider to establish care? No .   Dental Screening: Recommended annual dental exams for proper oral hygiene  Community Resource Referral / Chronic Care Management: CRR required this visit?  No   CCM required this visit?  No      Plan:     I have personally reviewed and noted the following in the patient's chart:   Medical and social history Use of alcohol, tobacco or illicit drugs  Current medications and supplements including opioid prescriptions. Patient is not currently taking opioid prescriptions. Functional ability and status Nutritional status Physical activity Advanced directives List of other physicians Hospitalizations, surgeries, and ER visits in previous 12  months Vitals Screenings to include cognitive, depression, and falls Referrals and appointments  In addition, I have reviewed and discussed with patient certain preventive protocols, quality metrics, and best practice recommendations. A written personalized care plan for preventive services as well as general preventive health recommendations were provided to patient.     Vanetta Mulders, [place]   9/60/4540   Due to this being a virtual visit, the after visit summary with patients personalized plan was offered to patient via mail or my-chart. per request, patient was mailed a copy of AVS  Nurse Notes: No concerns

## 2022-04-08 ENCOUNTER — Ambulatory Visit (INDEPENDENT_AMBULATORY_CARE_PROVIDER_SITE_OTHER): Payer: Medicare HMO | Admitting: Nurse Practitioner

## 2022-04-08 ENCOUNTER — Encounter: Payer: Self-pay | Admitting: Nurse Practitioner

## 2022-04-08 VITALS — BP 126/73 | HR 57 | Temp 97.8°F | Resp 20 | Ht 72.0 in | Wt 166.0 lb

## 2022-04-08 DIAGNOSIS — Z6828 Body mass index (BMI) 28.0-28.9, adult: Secondary | ICD-10-CM | POA: Diagnosis not present

## 2022-04-08 DIAGNOSIS — N400 Enlarged prostate without lower urinary tract symptoms: Secondary | ICD-10-CM

## 2022-04-08 DIAGNOSIS — E782 Mixed hyperlipidemia: Secondary | ICD-10-CM

## 2022-04-08 DIAGNOSIS — Q825 Congenital non-neoplastic nevus: Secondary | ICD-10-CM

## 2022-04-08 DIAGNOSIS — I10 Essential (primary) hypertension: Secondary | ICD-10-CM | POA: Diagnosis not present

## 2022-04-08 MED ORDER — SIMVASTATIN 40 MG PO TABS: 40.0000 mg | ORAL_TABLET | Freq: Every day | ORAL | 1 refills | Status: DC

## 2022-04-08 MED ORDER — LISINOPRIL-HYDROCHLOROTHIAZIDE 10-12.5 MG PO TABS: 1.0000 | ORAL_TABLET | Freq: Every day | ORAL | 1 refills | Status: DC

## 2022-04-08 NOTE — Patient Instructions (Signed)

## 2022-04-08 NOTE — Progress Notes (Signed)
Subjective:    Patient ID: Andre Estrada, male    DOB: [birth year], 82 y.o.   MRN: [id number]   Chief Complaint: No chief complaint on file.    HPI:  Andre Estrada is a 82 y.o. who identifies as a male who was assigned male at birth.   Social history: Lives with: his nephew lives with him Work history: retired   Scientist, forensic in today for follow up of the following chronic medical issues:  1. Primary hypertension No c/o chest pain, sob pr headache. Does not check blood pressure at home. BP Readings from Last 3 Encounters:  12/25/21 (!) 145/87  12/19/21 133/77  12/10/21 137/81     2. Mixed hyperlipidemia Does not watch diet and does no dedicated exercise. Lab Results  Component Value Date   CHOL 150 10/05/2021   HDL 54 10/05/2021   LDLCALC 71 10/05/2021   TRIG 144 10/05/2021   CHOLHDL 2.8 10/05/2021     3. Benign prostatic hyperplasia without lower urinary tract symptoms No voiding issues. Lab Results  Component Value Date   PSA1 5.4 (H) 01/08/2022   PSA1 3.0 10/05/2021   PSA1 3.9 06/28/2021   PSA 6.5 (H) 02/14/2014      4. Birth mark Has large seedy appearing birth on left sied of face. No recent changes  5. BMI 28.0-28.9,adult No recent weight changes Wt Readings from Last 3 Encounters:  04/08/22 166 lb (75.3 kg)  03/11/22 172 lb (78 kg)  01/08/22 163 lb (73.9 kg)   BMI Readings from Last 3 Encounters:  04/08/22 22.51 kg/m  03/11/22 23.33 kg/m  01/08/22 21.51 kg/m     New complaints: Recently had back surgery and is doing much better  Allergies  Allergen Reactions   Sulfa Antibiotics    Outpatient Encounter Medications as of 04/08/2022  Medication Sig   aspirin 81 MG tablet Take 81 mg by mouth daily.   finasteride (PROSCAR) 5 MG tablet Take 1 tablet (5 mg total) by mouth daily.   hydrocortisone 1 % lotion Apply 1 application topically 2 (two) times daily.   lisinopril-hydrochlorothiazide (ZESTORETIC) 10-12.5 MG tablet Take 1 tablet by  mouth daily.   simvastatin (ZOCOR) 40 MG tablet Take 1 tablet (40 mg total) by mouth at bedtime.   triamcinolone cream (KENALOG) 0.1 % Apply 1 application topically 2 (two) times daily.   No facility-administered encounter medications on file as of 04/08/2022.    Past Surgical History:  Procedure Laterality Date   LYMPH NODE BIOPSY     MELANOMA EXCISION     PARTIAL COLECTOMY     SPLENECTOMY, TOTAL      Family History  Problem Relation Age of Onset   Coronary artery disease Father    COPD Father    Prostatitis Brother    Diabetes Son    Prostatitis Brother    Alcohol abuse Brother       Controlled substance contract: n/a     Review of Systems  Constitutional:  Negative for diaphoresis.  Eyes:  Negative for pain.  Respiratory:  Negative for shortness of breath.   Cardiovascular:  Negative for chest pain, palpitations and leg swelling.  Gastrointestinal:  Negative for abdominal pain.  Endocrine: Negative for polydipsia.  Skin:  Negative for rash.  Neurological:  Negative for dizziness, weakness and headaches.  Hematological:  Does not bruise/bleed easily.  All other systems reviewed and are negative.      Objective:   Physical Exam Vitals and nursing note reviewed.  Constitutional:  Appearance: Normal appearance. He is well-developed.  HENT:     Head: Normocephalic.     Nose: Nose normal.     Mouth/Throat:     Mouth: Mucous membranes are moist.     Pharynx: Oropharynx is clear.  Eyes:     Pupils: Pupils are equal, round, and reactive to light.  Neck:     Thyroid: No thyroid mass or thyromegaly.     Vascular: No carotid bruit or JVD.     Trachea: Phonation normal.  Cardiovascular:     Rate and Rhythm: Normal rate and regular rhythm.  Pulmonary:     Effort: Pulmonary effort is normal. No respiratory distress.     Breath sounds: Normal breath sounds.  Abdominal:     General: Bowel sounds are normal.     Palpations: Abdomen is soft.     Tenderness:  There is no abdominal tenderness.     Hernia: A hernia (incinsional abdominal hernia- soft and nontender) is present.  Musculoskeletal:        General: Normal range of motion.     Cervical back: Normal range of motion and neck supple.  Lymphadenopathy:     Cervical: No cervical adenopathy.  Skin:    General: Skin is warm and dry.  Neurological:     Mental Status: He is alert and oriented to person, place, and time.  Psychiatric:        Behavior: Behavior normal.        Thought Content: Thought content normal.        Judgment: Judgment normal.    BP 126/73   Pulse (!) 57   Temp 97.8 F (36.6 C) (Temporal)   Resp 20   Ht 6' (1.829 m)   Wt 166 lb (75.3 kg)   SpO2 98%   BMI 22.51 kg/m         Assessment & Plan:   [facility] comes in today with chief complaint of Annual Exam (Losing weight)   Diagnosis and orders addressed:  1. Primary hypertension Low sodium diet - lisinopril-hydrochlorothiazide (ZESTORETIC) 10-12.5 MG tablet; Take 1 tablet by mouth daily.  Dispense: 90 tablet; Refill: 1 - CBC with Differential/Platelet - CMP14+EGFR  2. Mixed hyperlipidemia Low fat diet - simvastatin (ZOCOR) 40 MG tablet; Take 1 tablet (40 mg total) by mouth at bedtime.  Dispense: 90 tablet; Refill: 1 - Lipid panel  3. Benign prostatic hyperplasia without lower urinary tract symptoms Report any voiding issues  4. Birth mark  5. BMI 28.0-28.9,adult Discussed diet and exercise for person with BMI >25 Will recheck weight in 3-6 months    Labs pending Health Maintenance reviewed Diet and exercise encouraged  Follow up plan: 6 months   Mary-Margaret Hassell Done, FNP

## 2022-04-09 LAB — LIPID PANEL
Chol/HDL Ratio: 2.5 ratio (ref 0.0–5.0)
Cholesterol, Total: 136 mg/dL (ref 100–199)
HDL: 55 mg/dL (ref >39)
LDL Chol Calc (NIH): 58 mg/dL (ref 0–99)
Triglycerides: 131 mg/dL (ref 0–149)
VLDL Cholesterol Cal: 23 mg/dL (ref 5–40)

## 2022-04-09 LAB — CBC WITH DIFFERENTIAL/PLATELET
Basophils Absolute: 0 10*3/uL (ref 0.0–0.2)
Basos: 1 %
EOS (ABSOLUTE): 0.2 10*3/uL (ref 0.0–0.4)
Eos: 2 %
Hematocrit: 41.5 % (ref 37.5–51.0)
Hemoglobin: 14.3 g/dL (ref 13.0–17.7)
Immature Grans (Abs): 0 10*3/uL (ref 0.0–0.1)
Immature Granulocytes: 0 %
Lymphocytes Absolute: 2 10*3/uL (ref 0.7–3.1)
Lymphs: 26 %
MCH: 33.7 pg — ABNORMAL HIGH (ref 26.6–33.0)
MCHC: 34.5 g/dL (ref 31.5–35.7)
MCV: 98 fL — ABNORMAL HIGH (ref 79–97)
Monocytes Absolute: 0.7 10*3/uL (ref 0.1–0.9)
Monocytes: 10 %
Neutrophils Absolute: 4.6 10*3/uL (ref 1.4–7.0)
Neutrophils: 61 %
Platelets: 163 10*3/uL (ref 150–450)
RBC: 4.24 x10E6/uL (ref 4.14–5.80)
RDW: 12.9 % (ref 11.6–15.4)
WBC: 7.5 10*3/uL (ref 3.4–10.8)

## 2022-04-09 LAB — CMP14+EGFR
ALT: 18 IU/L (ref 0–44)
AST: 21 IU/L (ref 0–40)
Albumin/Globulin Ratio: 1.7 (ref 1.2–2.2)
Albumin: 4.4 g/dL (ref 3.8–4.8)
Alkaline Phosphatase: 44 IU/L (ref 44–121)
BUN/Creatinine Ratio: 10 (ref 10–24)
BUN: 9 mg/dL (ref 8–27)
Bilirubin Total: 0.6 mg/dL (ref 0.0–1.2)
CO2: 22 mmol/L (ref 20–29)
Calcium: 9.8 mg/dL (ref 8.6–10.2)
Chloride: 103 mmol/L (ref 96–106)
Creatinine, Ser: 0.87 mg/dL (ref 0.76–1.27)
Globulin, Total: 2.6 g/dL (ref 1.5–4.5)
Glucose: 106 mg/dL — ABNORMAL HIGH (ref 70–99)
Potassium: 3.5 mmol/L (ref 3.5–5.2)
Sodium: 142 mmol/L (ref 134–144)
Total Protein: 7 g/dL (ref 6.0–8.5)
eGFR: 87 mL/min/{1.73_m2} (ref >59)

## 2022-08-27 ENCOUNTER — Other Ambulatory Visit: Payer: Self-pay | Admitting: Urology

## 2022-08-27 DIAGNOSIS — N400 Enlarged prostate without lower urinary tract symptoms: Secondary | ICD-10-CM

## 2022-10-08 ENCOUNTER — Ambulatory Visit (INDEPENDENT_AMBULATORY_CARE_PROVIDER_SITE_OTHER): Payer: Medicare HMO | Admitting: Nurse Practitioner

## 2022-10-08 ENCOUNTER — Ambulatory Visit (INDEPENDENT_AMBULATORY_CARE_PROVIDER_SITE_OTHER): Payer: Medicare HMO

## 2022-10-08 ENCOUNTER — Encounter: Payer: Self-pay | Admitting: Nurse Practitioner

## 2022-10-08 VITALS — BP 132/76 | HR 63 | Temp 98.1°F | Resp 20 | Ht 72.0 in | Wt 160.0 lb

## 2022-10-08 DIAGNOSIS — N400 Enlarged prostate without lower urinary tract symptoms: Secondary | ICD-10-CM

## 2022-10-08 DIAGNOSIS — R0602 Shortness of breath: Secondary | ICD-10-CM | POA: Diagnosis not present

## 2022-10-08 DIAGNOSIS — Z6828 Body mass index (BMI) 28.0-28.9, adult: Secondary | ICD-10-CM

## 2022-10-08 DIAGNOSIS — R634 Abnormal weight loss: Secondary | ICD-10-CM

## 2022-10-08 DIAGNOSIS — Q825 Congenital non-neoplastic nevus: Secondary | ICD-10-CM | POA: Diagnosis not present

## 2022-10-08 DIAGNOSIS — R918 Other nonspecific abnormal finding of lung field: Secondary | ICD-10-CM | POA: Diagnosis not present

## 2022-10-08 DIAGNOSIS — I10 Essential (primary) hypertension: Secondary | ICD-10-CM | POA: Diagnosis not present

## 2022-10-08 DIAGNOSIS — E782 Mixed hyperlipidemia: Secondary | ICD-10-CM

## 2022-10-08 DIAGNOSIS — K432 Incisional hernia without obstruction or gangrene: Secondary | ICD-10-CM

## 2022-10-08 DIAGNOSIS — R972 Elevated prostate specific antigen [PSA]: Secondary | ICD-10-CM

## 2022-10-08 DIAGNOSIS — I700 Atherosclerosis of aorta: Secondary | ICD-10-CM | POA: Diagnosis not present

## 2022-10-08 MED ORDER — SIMVASTATIN 40 MG PO TABS: 40.0000 mg | ORAL_TABLET | Freq: Every day | ORAL | 1 refills | Status: DC

## 2022-10-08 MED ORDER — FINASTERIDE 5 MG PO TABS: 5.0000 mg | ORAL_TABLET | Freq: Every day | ORAL | 1 refills | Status: DC

## 2022-10-08 MED ORDER — LISINOPRIL-HYDROCHLOROTHIAZIDE 10-12.5 MG PO TABS
1.0000 | ORAL_TABLET | Freq: Every day | ORAL | 1 refills | Status: DC
Start: 2022-10-08 — End: 2023-04-04

## 2022-10-08 NOTE — Progress Notes (Signed)
Subjective:    Patient ID: Andre Estrada, male    DOB: 01-25-1941, 82 y.o.   MRN: [id number]   Chief Complaint: medical management of chronic issues     HPI:  Andre Estrada is a 82 y.o. who identifies as a male who was assigned male at birth.   Social history: Lives with: nephew Work history: retired   Water engineer in today for follow up of the following chronic medical issues:  1. Primary hypertension No c/o chest pain, sob or headache. He does not check blood pressure at home. BP Readings from Last 3 Encounters:  04/08/22 126/73  12/25/21 (!) 145/87  12/19/21 133/77     2. Mixed hyperlipidemia Doe snot watch diet and does no dedicated exercise Lab Results  Component Value Date   CHOL 136 04/08/2022   HDL 55 04/08/2022   LDLCALC 58 04/08/2022   TRIG 131 04/08/2022   CHOLHDL 2.5 04/08/2022     3. Incisional hernia, without obstruction or gangrene Has lrgae abdinal hernia but e denies any pain or discomfort  4. Birth mark Large birthmark on left side of face- no changes  5. BMI 28.0-28.9,adult Has continued to gradually loose weight. Denis any abd pain, diarrhea or constipation.  Says he just does not have an appetite.  Wt Readings from Last 3 Encounters:  10/08/22 160 lb (72.6 kg)  04/08/22 166 lb (75.3 kg)  03/11/22 172 lb (78 kg)   BMI Readings from Last 3 Encounters:  10/08/22 21.70 kg/m  04/08/22 22.51 kg/m  03/11/22 23.33 kg/m      New complaints: None today  Allergies  Allergen Reactions   Sulfa Antibiotics    Outpatient Encounter Medications as of 10/08/2022  Medication Sig   aspirin 81 MG tablet Take 81 mg by mouth daily.   finasteride (PROSCAR) 5 MG tablet Take 1 tablet by mouth once daily   hydrocortisone 1 % lotion Apply 1 application topically 2 (two) times daily.   lisinopril-hydrochlorothiazide (ZESTORETIC) 10-12.5 MG tablet Take 1 tablet by mouth daily.   simvastatin (ZOCOR) 40 MG tablet Take 1 tablet (40 mg total) by mouth at  bedtime.   triamcinolone cream (KENALOG) 0.1 % Apply 1 application topically 2 (two) times daily.   No facility-administered encounter medications on file as of 10/08/2022.    Past Surgical History:  Procedure Laterality Date   LYMPH NODE BIOPSY     MELANOMA EXCISION     PARTIAL COLECTOMY     SPLENECTOMY, TOTAL      Family History  Problem Relation Age of Onset   Coronary artery disease Father    COPD Father    Prostatitis Brother    Diabetes Son    Prostatitis Brother    Alcohol abuse Brother       Controlled substance contract: n/a     Review of Systems  Constitutional:  Negative for diaphoresis.  Eyes:  Negative for pain.  Respiratory:  Negative for shortness of breath.   Cardiovascular:  Negative for chest pain, palpitations and leg swelling.  Gastrointestinal:  Negative for abdominal pain.  Endocrine: Negative for polydipsia.  Skin:  Negative for rash.  Neurological:  Negative for dizziness, weakness and headaches.  Hematological:  Does not bruise/bleed easily.  All other systems reviewed and are negative.      Objective:   Physical Exam Vitals and nursing note reviewed.  Constitutional:      Appearance: Normal appearance. He is well-developed.  HENT:     Head: Normocephalic.  Nose: Nose normal.     Mouth/Throat:     Mouth: Mucous membranes are moist.     Pharynx: Oropharynx is clear.  Eyes:     Pupils: Pupils are equal, round, and reactive to light.  Neck:     Thyroid: No thyroid mass or thyromegaly.     Vascular: No carotid bruit or JVD.     Trachea: Phonation normal.  Cardiovascular:     Rate and Rhythm: Normal rate and regular rhythm.  Pulmonary:     Effort: Pulmonary effort is normal. No respiratory distress.     Breath sounds: Normal breath sounds.  Abdominal:     General: Bowel sounds are normal.     Palpations: Abdomen is soft.     Tenderness: There is no abdominal tenderness.     Hernia: A hernia (soft nontender incisional hernia)  is present.  Musculoskeletal:        General: Normal range of motion.     Cervical back: Normal range of motion and neck supple.  Lymphadenopathy:     Cervical: No cervical adenopathy.  Skin:    General: Skin is warm and dry.     Comments: Birth mark- purple seedy on left side of face.  Neurological:     Mental Status: He is alert and oriented to person, place, and time.  Psychiatric:        Behavior: Behavior normal.        Thought Content: Thought content normal.        Judgment: Judgment normal.      BP 132/76   Pulse 63   Temp 98.1 F (36.7 C) (Temporal)   Resp 20   Ht 6' (1.829 m)   Wt 160 lb (72.6 kg)   SpO2 98%   BMI 21.70 kg/m   Chest xray unchanged from previous-Preliminary reading by Paulene Floor, FNP  [facility]      Assessment & Plan:   Andre Estrada comes in today with chief complaint of Medical Management of Chronic Issues (Weight loss. No appetite/)   Diagnosis and orders addressed:  1. Primary hypertension Low sodium diet - lisinopril-hydrochlorothiazide (ZESTORETIC) 10-12.5 MG tablet; Take 1 tablet by mouth daily.  Dispense: 90 tablet; Refill: 1 - CBC with Differential/Platelet - CMP14+EGFR  2. Mixed hyperlipidemia Low fat diet - simvastatin (ZOCOR) 40 MG tablet; Take 1 tablet (40 mg total) by mouth at bedtime.  Dispense: 90 tablet; Refill: 1 - Lipid panel  3. Incisional hernia, without obstruction or gangrene Report any pain  4. Birth mark  5. BMI 28.0-28.9,adult Discussed diet and exercise for person with BMI >25 Will recheck weight in 3-6 months   6. Weight loss Add boost or ensure to dit daily - DG Chest 2 View  7. Elevated PSA Labs pending - PSA, total and free  8. Benign prostatic hyperplasia without lower urinary tract symptoms Report any voiding issues - finasteride (PROSCAR) 5 MG tablet; Take 1 tablet (5 mg total) by mouth daily.  Dispense: 90 tablet; Refill: 1   Labs pending Health Maintenance reviewed Diet and  exercise encouraged  Follow up plan: 6 months   [name], FNP

## 2022-10-09 LAB — LIPID PANEL
Chol/HDL Ratio: 2.3 ratio (ref 0.0–5.0)
Cholesterol, Total: 144 mg/dL (ref 100–199)
HDL: 63 mg/dL (ref >39)
LDL Chol Calc (NIH): 65 mg/dL (ref 0–99)
Triglycerides: 86 mg/dL (ref 0–149)
VLDL Cholesterol Cal: 16 mg/dL (ref 5–40)

## 2022-10-09 LAB — CMP14+EGFR
ALT: 15 IU/L (ref 0–44)
AST: 20 IU/L (ref 0–40)
Albumin: 4.1 g/dL (ref 3.8–4.8)
Alkaline Phosphatase: 42 IU/L — ABNORMAL LOW (ref 44–121)
BUN/Creatinine Ratio: 9 — ABNORMAL LOW (ref 10–24)
BUN: 10 mg/dL (ref 8–27)
Bilirubin Total: 0.6 mg/dL (ref 0.0–1.2)
CO2: 25 mmol/L (ref 20–29)
Calcium: 9.5 mg/dL (ref 8.6–10.2)
Chloride: 102 mmol/L (ref 96–106)
Creatinine, Ser: 1.08 mg/dL (ref 0.76–1.27)
Globulin, Total: 2 g/dL (ref 1.5–4.5)
Glucose: 100 mg/dL — ABNORMAL HIGH (ref 70–99)
Potassium: 3.8 mmol/L (ref 3.5–5.2)
Sodium: 144 mmol/L (ref 134–144)
Total Protein: 6.1 g/dL (ref 6.0–8.5)
eGFR: 69 mL/min/{1.73_m2} (ref >59)

## 2022-10-09 LAB — CBC WITH DIFFERENTIAL/PLATELET
Basophils Absolute: 0.1 10*3/uL (ref 0.0–0.2)
Basos: 1 %
EOS (ABSOLUTE): 0.4 10*3/uL (ref 0.0–0.4)
Eos: 5 %
Hematocrit: 40.9 % (ref 37.5–51.0)
Hemoglobin: 13.8 g/dL (ref 13.0–17.7)
Immature Grans (Abs): 0 10*3/uL (ref 0.0–0.1)
Immature Granulocytes: 0 %
Lymphocytes Absolute: 1.8 10*3/uL (ref 0.7–3.1)
Lymphs: 25 %
MCH: 33.4 pg — ABNORMAL HIGH (ref 26.6–33.0)
MCHC: 33.7 g/dL (ref 31.5–35.7)
MCV: 99 fL — ABNORMAL HIGH (ref 79–97)
Monocytes Absolute: 0.9 10*3/uL (ref 0.1–0.9)
Monocytes: 13 %
Neutrophils Absolute: 4.1 10*3/uL (ref 1.4–7.0)
Neutrophils: 56 %
Platelets: 150 10*3/uL (ref 150–450)
RBC: 4.13 x10E6/uL — ABNORMAL LOW (ref 4.14–5.80)
RDW: 12.3 % (ref 11.6–15.4)
WBC: 7.3 10*3/uL (ref 3.4–10.8)

## 2022-10-09 LAB — PSA, TOTAL AND FREE
PSA, Free Pct: 24.4 %
PSA, Free: 0.88 ng/mL
Prostate Specific Ag, Serum: 3.6 ng/mL (ref 0.0–4.0)

## 2023-03-17 ENCOUNTER — Ambulatory Visit: Payer: Medicare HMO

## 2023-03-17 VITALS — Ht 72.0 in | Wt 160.0 lb

## 2023-03-17 DIAGNOSIS — Z0000 Encounter for general adult medical examination without abnormal findings: Secondary | ICD-10-CM | POA: Diagnosis not present

## 2023-03-17 NOTE — Patient Instructions (Signed)
Mr. Jansson , Thank you for taking time to come for your Medicare Wellness Visit. I appreciate your ongoing commitment to your health goals. Please review the following plan we discussed and let me know if I can assist you in the future.   Referrals/Orders/Follow-Ups/Clinician Recommendations: Aim for 30 minutes of exercise or brisk walking, 6-8 glasses of water, and 5 servings of fruits and vegetables each day.  This is a list of the screening recommended for you and due dates:  Health Maintenance  Topic Date Due   COVID-19 Vaccine (5 - 2024-25 season) 12/19/2022   Medicare Annual Wellness Visit  03/16/2024   DTaP/Tdap/Td vaccine (2 - Td or Tdap) 10/26/2030   Pneumonia Vaccine  Completed   Flu Shot  Completed   Zoster (Shingles) Vaccine  Completed   HPV Vaccine  Aged Out    Advanced directives: (ACP Link)Information on Advanced Care Planning can be found at [facility] Advance Health Care Directives Advance Health Care Directives ([email or web address]   Next Medicare Annual Wellness Visit scheduled for next year: Yes

## 2023-03-17 NOTE — Progress Notes (Signed)
Subjective:   Andre Estrada is a 83 y.o. male who presents for Medicare Annual/Subsequent preventive examination.  Visit Complete: Virtual I connected with  Andre Estrada on 03/17/23 by a audio enabled telemedicine application and verified that I am speaking with the correct person using two identifiers.  Patient Location: Home  Provider Location: Home Office  This patient declined Interactive audio and video telecommunications. Therefore the visit was completed with audio only.  I discussed the limitations of evaluation and management by telemedicine. The patient expressed understanding and agreed to proceed.  Vital Signs: Because this visit was a virtual/telehealth visit, some criteria may be missing or patient reported. Any vitals not documented were not able to be obtained and vitals that have been documented are patient reported.  Cardiac Risk Factors include: advanced age (>60men, >60 women);male gender;hypertension;smoking/ tobacco exposure     Objective:    Today's Vitals   03/17/23 1614  Weight: 160 lb (72.6 kg)  Height: 6' (1.829 m)   Body mass index is 21.7 kg/m.     03/17/2023    4:19 PM 03/11/2022    1:32 PM 04/14/2019    2:03 PM 10/02/2017    8:57 AM 06/06/2017    7:46 AM 02/14/2014   10:16 AM  Advanced Directives  Does Patient Have a Medical Advance Directive? No No No No;Yes No Yes  Type of Advance Directive    Living will    Copy of Healthcare Power of Attorney in Chart?      No - copy requested  Would patient like information on creating a medical advance directive? Yes (MAU/Ambulatory/Procedural Areas - Information given) Yes (MAU/Ambulatory/Procedural Areas - Information given) No - Patient declined  No - Patient declined     Current Medications (verified) Outpatient Encounter Medications as of 03/17/2023  Medication Sig   aspirin 81 MG tablet Take 81 mg by mouth daily.   finasteride (PROSCAR) 5 MG tablet Take 1 tablet (5 mg total) by mouth daily.    hydrocortisone 1 % lotion Apply 1 application topically 2 (two) times daily.   lisinopril-hydrochlorothiazide (ZESTORETIC) 10-12.5 MG tablet Take 1 tablet by mouth daily.   simvastatin (ZOCOR) 40 MG tablet Take 1 tablet (40 mg total) by mouth at bedtime.   triamcinolone cream (KENALOG) 0.1 % Apply 1 application topically 2 (two) times daily.   No facility-administered encounter medications on file as of 03/17/2023.    Allergies (verified) Sulfa antibiotics   History: Past Medical History:  Diagnosis Date   Fracture of thumb    left   Hyperlipidemia    Hypertension    Melanoma (HCC)    BACK AND STOMACH    Prostate enlargement    Sinusitis    Tobacco dependence    Past Surgical History:  Procedure Laterality Date   LYMPH NODE BIOPSY     MELANOMA EXCISION     PARTIAL COLECTOMY     SPLENECTOMY, TOTAL     Family History  Problem Relation Age of Onset   Coronary artery disease Father    COPD Father    Prostatitis Brother    Diabetes Son    Prostatitis Brother    Alcohol abuse Brother    Social History   Socioeconomic History   Marital status: Widowed    Spouse name: Not on file   Number of children: 1   Years of education: 9   Highest education level: 9th grade  Occupational History   Occupation: retired  Tobacco Use   Smoking status: Every  Day    Current packs/day: 1.00    Average packs/day: 1 pack/day for 60.0 years (60.0 ttl pk-yrs)    Types: Cigarettes   Smokeless tobacco: Never  Vaping Use   Vaping status: Never Used  Substance and Sexual Activity   Alcohol use: No   Drug use: No   Sexual activity: Not Currently  Other Topics Concern   Not on file  Social History Narrative   Son lives with him   He's still very independent   Social Drivers of Corporate investment banker Strain: Low Risk  (03/17/2023)   Overall Financial Resource Strain (CARDIA)    Difficulty of Paying Living Expenses: Not hard at all  Food Insecurity: No Food Insecurity  (03/17/2023)   Hunger Vital Sign    Worried About Running Out of Food in the Last Year: Never true    Ran Out of Food in the Last Year: Never true  Transportation Needs: No Transportation Needs (03/17/2023)   PRAPARE - Administrator, Civil Service (Medical): No    Lack of Transportation (Non-Medical): No  Physical Activity: Sufficiently Active (03/17/2023)   Exercise Vital Sign    Days of Exercise per Week: 5 days    Minutes of Exercise per Session: 30 min  Stress: No Stress Concern Present (03/17/2023)   [employer] of Occupational Health - Occupational Stress Questionnaire    Feeling of Stress : Not at all  Social Connections: Socially Isolated (03/17/2023)   Social Connection and Isolation Panel [NHANES]    Frequency of Communication with Friends and Family: More than three times a week    Frequency of Social Gatherings with Friends and Family: Three times a week    Attends Religious Services: Never    Active Member of Clubs or Organizations: No    Attends Banker Meetings: Never    Marital Status: Widowed    Tobacco Counseling Ready to quit: Not Answered Counseling given: Not Answered   Clinical Intake:  Pre-visit preparation completed: Yes  Pain : No/denies pain     Diabetes: No  How often do you need to have someone help you when you read instructions, pamphlets, or other written materials from your doctor or pharmacy?: 1 - Never  Interpreter Needed?: No  Information entered by :: Kandis Fantasia LPN   Activities of Daily Living    03/17/2023    4:16 PM  In your present state of health, do you have any difficulty performing the following activities:  Hearing? 0  Vision? 0  Difficulty concentrating or making decisions? 0  Walking or climbing stairs? 0  Dressing or bathing? 0  Doing errands, shopping? 0  Preparing Food and eating ? N  Using the Toilet? N  In the past six months, have you accidently leaked urine? N  Do you have  problems with loss of bowel control? N  Managing your Medications? N  Managing your Finances? N  Housekeeping or managing your Housekeeping? N    Patient Care Team: Bennie Pierini, FNP as PCP - General (Nurse Practitioner)  Indicate any recent Medical Services you may have received from other than [facility] providers in the past year (date may be approximate).     Assessment:   This is a routine wellness examination for Kalib.  Hearing/Vision screen Hearing Screening - Comments:: Denies hearing difficulties   Vision Screening - Comments:: No vision problems; will schedule routine eye exam soon     Goals Addressed  This Visit's Progress    Remain active and independent        Depression Screen    03/17/2023    4:17 PM 10/08/2022    8:22 AM 04/08/2022    8:48 AM 03/11/2022    1:30 PM 12/19/2021    8:18 AM 12/10/2021    9:04 AM 10/05/2021    8:05 AM  PHQ 2/9 Scores  PHQ - 2 Score 0 0 0 0 0 0 0  PHQ- 9 Score  0 0  0 0 0    Fall Risk    03/17/2023    4:19 PM 10/08/2022    8:22 AM 04/08/2022    8:48 AM 03/11/2022    1:29 PM 12/19/2021    8:18 AM  Fall Risk   Falls in the past year? 0 0 0 0 0  Number falls in past yr: 0   0   Injury with Fall? 0   0   Risk for fall due to : No Fall Risks      Follow up Falls prevention discussed;Education provided;Falls evaluation completed   Falls prevention discussed;Education provided;Falls evaluation completed     MEDICARE RISK AT HOME: Medicare Risk at Home Any stairs in or around the home?: No If so, are there any without handrails?: No Home free of loose throw rugs in walkways, pet beds, electrical cords, etc?: Yes Adequate lighting in your home to reduce risk of falls?: Yes Life alert?: No Use of a cane, walker or w/c?: No Grab bars in the bathroom?: Yes Shower chair or bench in shower?: No Elevated toilet seat or a handicapped toilet?: Yes  TIMED UP AND GO:  Was the test performed?  No    Cognitive  Function:    10/02/2017    9:03 AM  MMSE - Mini Mental State Exam  Orientation to time 5  Orientation to Place 5  Registration 3  Attention/ Calculation 4  Recall 2  Language- name 2 objects 2  Language- repeat 1  Language- follow 3 step command 3  Language- read & follow direction 1  Write a sentence 1  Copy design 1  Total score 28        03/17/2023    4:20 PM 03/11/2022    1:33 PM 03/08/2021   11:38 AM 04/14/2019    2:07 PM  6CIT Screen  What Year? 0 points 0 points 0 points 0 points  What month? 0 points 0 points 0 points 0 points  What time? 0 points 0 points 0 points 0 points  Count back from 20 0 points 0 points 0 points 0 points  Months in reverse 2 points 2 points 4 points 4 points  Repeat phrase 0 points 4 points 6 points 4 points  Total Score 2 points 6 points 10 points 8 points    Immunizations Immunization History  Administered Date(s) Administered   Fluad Quad(high Dose 65+) 11/03/2018   Influenza, High Dose Seasonal PF 10/14/2014, 10/29/2016   Influenza,inj,Quad PF,6+ Mos 11/17/2013, 12/02/2016   Influenza-Unspecified 10/07/2012, 12/17/2017, 10/25/2020, 10/24/2022   Moderna SARS-COV2 Booster Vaccination 12/12/2019, 05/25/2020   Moderna Sars-Covid-2 Vaccination 03/31/2019, 04/28/2019, 11/14/2020   Pneumococcal Conjugate-13 05/18/2014   Pneumococcal Polysaccharide-23 02/18/2010, 10/29/2016   Rsv, Mab, Judi Cong, 1 Ml, Neonate To 24 Mos(Beyfortus) 11/07/2021   Tdap 10/25/2020   Unspecified SARS-COV-2 Vaccination 10/24/2022   Zoster Recombinant(Shingrix) 11/12/2018, 02/07/2019    TDAP status: Up to date  Flu Vaccine status: Up to date  Pneumococcal vaccine status: Up to  date  Covid-19 vaccine status: Information provided on how to obtain vaccines.   Qualifies for Shingles Vaccine? Yes   Zostavax completed No   Shingrix Completed?: Yes  Screening Tests Health Maintenance  Topic Date Due   COVID-19 Vaccine (5 - 2024-25 season) 12/19/2022    Medicare Annual Wellness (AWV)  03/16/2024   DTaP/Tdap/Td (2 - Td or Tdap) 10/26/2030   Pneumonia Vaccine 103+ Years old  Completed   INFLUENZA VACCINE  Completed   Zoster Vaccines- Shingrix  Completed   HPV VACCINES  Aged Out    Health Maintenance  Health Maintenance Due  Topic Date Due   COVID-19 Vaccine (5 - 2024-25 season) 12/19/2022    Colorectal cancer screening: No longer required.   Lung Cancer Screening: (Low Dose CT Chest recommended if Age 76-80 years, 20 pack-year currently smoking OR have quit w/in 15years.) does not qualify.   Lung Cancer Screening Referral: n/a  Additional Screening:  Hepatitis C Screening: does not qualify  Vision Screening: Recommended annual ophthalmology exams for early detection of glaucoma and other disorders of the eye. Is the patient up to date with their annual eye exam?  No  Who is the provider or what is the name of the office in which the patient attends annual eye exams? none If pt is not established with a provider, would they like to be referred to a provider to establish care? No .   Dental Screening: Recommended annual dental exams for proper oral hygiene  Community Resource Referral / Chronic Care Management: CRR required this visit?  No   CCM required this visit?  No     Plan:     I have personally reviewed and noted the following in the patient's chart:   Medical and social history Use of alcohol, tobacco or illicit drugs  Current medications and supplements including opioid prescriptions. Patient is not currently taking opioid prescriptions. Functional ability and status Nutritional status Physical activity Advanced directives List of other physicians Hospitalizations, surgeries, and ER visits in previous 12 months Vitals Screenings to include cognitive, depression, and falls Referrals and appointments  In addition, I have reviewed and discussed with patient certain preventive protocols, quality metrics, and  best practice recommendations. A written personalized care plan for preventive services as well as general preventive health recommendations were provided to patient.     Kandis Fantasia [place], [place]   09/28/9145   After Visit Summary: (Mail) Due to this being a telephonic visit, the after visit summary with patients personalized plan was offered to patient via mail   Nurse Notes: No concerns at this time

## 2023-04-04 ENCOUNTER — Other Ambulatory Visit: Payer: Self-pay | Admitting: Nurse Practitioner

## 2023-04-04 DIAGNOSIS — I10 Essential (primary) hypertension: Secondary | ICD-10-CM

## 2023-04-08 ENCOUNTER — Ambulatory Visit (INDEPENDENT_AMBULATORY_CARE_PROVIDER_SITE_OTHER): Payer: Medicare HMO | Admitting: Nurse Practitioner

## 2023-04-08 ENCOUNTER — Encounter: Payer: Self-pay | Admitting: Nurse Practitioner

## 2023-04-08 VITALS — BP 134/89 | HR 74 | Temp 98.0°F | Wt 162.0 lb

## 2023-04-08 DIAGNOSIS — I10 Essential (primary) hypertension: Secondary | ICD-10-CM

## 2023-04-08 DIAGNOSIS — N400 Enlarged prostate without lower urinary tract symptoms: Secondary | ICD-10-CM | POA: Diagnosis not present

## 2023-04-08 DIAGNOSIS — K432 Incisional hernia without obstruction or gangrene: Secondary | ICD-10-CM | POA: Diagnosis not present

## 2023-04-08 DIAGNOSIS — Q825 Congenital non-neoplastic nevus: Secondary | ICD-10-CM

## 2023-04-08 DIAGNOSIS — Z6828 Body mass index (BMI) 28.0-28.9, adult: Secondary | ICD-10-CM | POA: Diagnosis not present

## 2023-04-08 DIAGNOSIS — E782 Mixed hyperlipidemia: Secondary | ICD-10-CM | POA: Diagnosis not present

## 2023-04-08 LAB — LIPID PANEL

## 2023-04-08 MED ORDER — SIMVASTATIN 40 MG PO TABS
40.0000 mg | ORAL_TABLET | Freq: Every day | ORAL | 1 refills | Status: DC
Start: 2023-04-08 — End: 2023-09-29

## 2023-04-08 MED ORDER — FINASTERIDE 5 MG PO TABS
5.0000 mg | ORAL_TABLET | Freq: Every day | ORAL | 1 refills | Status: DC
Start: 2023-04-08 — End: 2023-09-29

## 2023-04-08 MED ORDER — LISINOPRIL-HYDROCHLOROTHIAZIDE 10-12.5 MG PO TABS
1.0000 | ORAL_TABLET | Freq: Every day | ORAL | 1 refills | Status: DC
Start: 2023-04-08 — End: 2023-09-29

## 2023-04-08 NOTE — Progress Notes (Signed)
 Subjective:    Patient ID: Andre Estrada, male    DOB: 09-Jul-1940, 83 y.o.   MRN: [id number]   Chief Complaint: medical management of chronic issues     HPI:  Andre Estrada is a 83 y.o. who identifies as a male who was assigned male at birth.   Social history: Lives with: nephew Work history: retired   Water engineer in today for follow up of the following chronic medical issues:  1. Primary hypertension No c/o chest pain, sob or headache. He does not check blood pressure at home. BP Readings from Last 3 Encounters:  10/08/22 132/76  04/08/22 126/73  12/25/21 (!) 145/87     2. Mixed hyperlipidemia Doe snot watch diet and does no dedicated exercise Lab Results  Component Value Date   CHOL 144 10/08/2022   HDL 63 10/08/2022   LDLCALC 65 10/08/2022   TRIG 86 10/08/2022   CHOLHDL 2.3 10/08/2022     3. Incisional hernia, without obstruction or gangrene Has lrgae abdinal hernia but e denies any pain or discomfort  4. Birth mark Large birthmark on left side of face- no changes  5. BPH without lower urinary track symptoms Is on finesteride daily and denies any urinary problems. She's urology yearly Lab Results  Component Value Date   PSA1 3.6 10/08/2022   PSA1 5.4 (H) 01/08/2022   PSA1 3.0 10/05/2021   PSA 6.5 (H) 02/14/2014      6. BMI 28.0-28.9,adult Has continued to gradually loose weight. Denis any abd pain, diarrhea or constipation.  Says he just does not have an appetite.   Wt Readings from Last 3 Encounters:  04/08/23 162 lb (73.5 kg)  03/17/23 160 lb (72.6 kg)  10/08/22 160 lb (72.6 kg)   BMI Readings from Last 3 Encounters:  04/08/23 21.97 kg/m  03/17/23 21.70 kg/m  10/08/22 21.70 kg/m       New complaints: None today  Allergies  Allergen Reactions   Sulfa Antibiotics    Outpatient Encounter Medications as of 04/08/2023  Medication Sig   aspirin 81 MG tablet Take 81 mg by mouth daily.   finasteride (PROSCAR) 5 MG tablet Take 1  tablet (5 mg total) by mouth daily.   hydrocortisone 1 % lotion Apply 1 application topically 2 (two) times daily.   lisinopril-hydrochlorothiazide (ZESTORETIC) 10-12.5 MG tablet Take 1 tablet by mouth once daily   simvastatin (ZOCOR) 40 MG tablet Take 1 tablet (40 mg total) by mouth at bedtime.   triamcinolone cream (KENALOG) 0.1 % Apply 1 application topically 2 (two) times daily.   No facility-administered encounter medications on file as of 04/08/2023.    Past Surgical History:  Procedure Laterality Date   LYMPH NODE BIOPSY     MELANOMA EXCISION     PARTIAL COLECTOMY     SPLENECTOMY, TOTAL      Family History  Problem Relation Age of Onset   Coronary artery disease Father    COPD Father    Prostatitis Brother    Diabetes Son    Prostatitis Brother    Alcohol abuse Brother       Controlled substance contract: n/a     Review of Systems  Constitutional:  Negative for diaphoresis.  Eyes:  Negative for pain.  Respiratory:  Negative for shortness of breath.   Cardiovascular:  Negative for chest pain, palpitations and leg swelling.  Gastrointestinal:  Negative for abdominal pain.  Endocrine: Negative for polydipsia.  Skin:  Negative for rash.  Neurological:  Negative for dizziness,  weakness and headaches.  Hematological:  Does not bruise/bleed easily.  All other systems reviewed and are negative.      Objective:   Physical Exam Vitals and nursing note reviewed.  Constitutional:      Appearance: Normal appearance. He is well-developed.  HENT:     Head: Normocephalic.     Nose: Nose normal.     Mouth/Throat:     Mouth: Mucous membranes are moist.     Pharynx: Oropharynx is clear.  Eyes:     Pupils: Pupils are equal, round, and reactive to light.  Neck:     Thyroid: No thyroid mass or thyromegaly.     Vascular: No carotid bruit or JVD.     Trachea: Phonation normal.  Cardiovascular:     Rate and Rhythm: Normal rate and regular rhythm.  Pulmonary:      Effort: Pulmonary effort is normal. No respiratory distress.     Breath sounds: Normal breath sounds.  Abdominal:     General: Bowel sounds are normal.     Palpations: Abdomen is soft.     Tenderness: There is no abdominal tenderness.     Hernia: A hernia (soft nontender incisional hernia) is present.  Musculoskeletal:        General: Normal range of motion.     Cervical back: Normal range of motion and neck supple.  Lymphadenopathy:     Cervical: No cervical adenopathy.  Skin:    General: Skin is warm and dry.     Comments: Birth mark- purple seedy on left side of face.  Neurological:     Mental Status: He is alert and oriented to person, place, and time.  Psychiatric:        Behavior: Behavior normal.        Thought Content: Thought content normal.        Judgment: Judgment normal.      BP 134/89   Pulse 74   Temp 98 F (36.7 C)   Wt 162 lb (73.5 kg)   SpO2 99%   BMI 21.97 kg/m        Assessment & Plan:   [facility] comes in today with chief complaint of medical management of chronic issues    Diagnosis and orders addressed:  1. Primary hypertension Low sodium diet - lisinopril-hydrochlorothiazide (ZESTORETIC) 10-12.5 MG tablet; Take 1 tablet by mouth daily.  Dispense: 90 tablet; Refill: 1 - CBC with Differential/Platelet - CMP14+EGFR  2. Mixed hyperlipidemia Low fat diet - simvastatin (ZOCOR) 40 MG tablet; Take 1 tablet (40 mg total) by mouth at bedtime.  Dispense: 90 tablet; Refill: 1 - Lipid panel  3. Incisional hernia, without obstruction or gangrene Report any pain  4. Birth mark Report any changes  5. BMI 28.0-28.9,adult Discussed diet and exercise for person with BMI >25 Will recheck weight in 3-6 months   6. Benign prostatic hyperplasia without lower urinary tract symptoms Report any voiding issues - finasteride (PROSCAR) 5 MG tablet; Take 1 tablet (5 mg total) by mouth daily.  Dispense: 90 tablet; Refill: 1   Labs pending Health  Maintenance reviewed Diet and exercise encouraged  Follow up plan: 6 months   Andre Daphine Deutscher, FNP

## 2023-04-08 NOTE — Addendum Note (Signed)
 Addended by: Bennie Pierini on: 04/08/2023 02:30 PM   Modules accepted: Orders

## 2023-04-08 NOTE — Patient Instructions (Signed)
 Fall Prevention in the Home, Adult Falls can cause injuries and can happen to people of all ages. There are many things you can do to make your home safer and to help prevent falls. What actions can I take to prevent falls? General information Use good lighting in all rooms. Make sure to: Replace any light bulbs that burn out. Turn on the lights in dark areas and use night-lights. Keep items that you use often in easy-to-reach places. Lower the shelves around your home if needed. Move furniture so that there are clear paths around it. Do not use throw rugs or other things on the floor that can make you trip. If any of your floors are uneven, fix them. Add color or contrast paint or tape to clearly mark and help you see: Grab bars or handrails. First and last steps of staircases. Where the edge of each step is. If you use a ladder or stepladder: Make sure that it is fully opened. Do not climb a closed ladder. Make sure the sides of the ladder are locked in place. Have someone hold the ladder while you use it. Know where your pets are as you move through your home. What can I do in the bathroom?     Keep the floor dry. Clean up any water on the floor right away. Remove soap buildup in the bathtub or shower. Buildup makes bathtubs and showers slippery. Use non-skid mats or decals on the floor of the bathtub or shower. Attach bath mats securely with double-sided, non-slip rug tape. If you need to sit down in the shower, use a non-slip stool. Install grab bars by the toilet and in the bathtub and shower. Do not use towel bars as grab bars. What can I do in the bedroom? Make sure that you have a light by your bed that is easy to reach. Do not use any sheets or blankets on your bed that hang to the floor. Have a firm chair or bench with side arms that you can use for support when you get dressed. What can I do in the kitchen? Clean up any spills right away. If you need to reach something  above you, use a step stool with a grab bar. Keep electrical cords out of the way. Do not use floor polish or wax that makes floors slippery. What can I do with my stairs? Do not leave anything on the stairs. Make sure that you have a light switch at the top and the bottom of the stairs. Make sure that there are handrails on both sides of the stairs. Fix handrails that are broken or loose. Install non-slip stair treads on all your stairs if they do not have carpet. Avoid having throw rugs at the top or bottom of the stairs. Choose a carpet that does not hide the edge of the steps on the stairs. Make sure that the carpet is firmly attached to the stairs. Fix carpet that is loose or worn. What can I do on the outside of my home? Use bright outdoor lighting. Fix the edges of walkways and driveways and fix any cracks. Clear paths of anything that can make you trip, such as tools or rocks. Add color or contrast paint or tape to clearly mark and help you see anything that might make you trip as you walk through a door, such as a raised step or threshold. Trim any bushes or trees on paths to your home. Check to see if handrails are loose  or broken and that both sides of all steps have handrails. Install guardrails along the edges of any raised decks and porches. Have leaves, snow, or ice cleared regularly. Use sand, salt, or ice melter on paths if you live where there is ice and snow during the winter. Clean up any spills in your garage right away. This includes grease or oil spills. What other actions can I take? Review your medicines with your doctor. Some medicines can cause dizziness or changes in blood pressure, which increase your risk of falling. Wear shoes that: Have a low heel. Do not wear high heels. Have rubber bottoms and are closed at the toe. Feel good on your feet and fit well. Use tools that help you move around if needed. These include: Canes. Walkers. Scooters. Crutches. Ask  your doctor what else you can do to help prevent falls. This may include seeing a physical therapist to learn to do exercises to move better and get stronger. Where to find more information Centers for Disease Control and Prevention, STEADI: TonerPromos.no [employer] on Aging: BaseRingTones.pl National Institute on Aging: BaseRingTones.pl Contact a doctor if: You are afraid of falling at home. You feel weak, drowsy, or dizzy at home. You fall at home. Get help right away if you: Lose consciousness or have trouble moving after a fall. Have a fall that causes a head injury. These symptoms may be an emergency. Get help right away. Call 911. Do not wait to see if the symptoms will go away. Do not drive yourself to the hospital. This information is not intended to replace advice given to you by your health care provider. Make sure you discuss any questions you have with your health care provider. Document Revised: 10/08/2021 Document Reviewed: 10/08/2021 Elsevier Patient Education  2024 [employer].

## 2023-04-09 LAB — CBC WITH DIFFERENTIAL/PLATELET
Basophils Absolute: 0.1 10*3/uL (ref 0.0–0.2)
Basos: 1 %
EOS (ABSOLUTE): 0.4 10*3/uL (ref 0.0–0.4)
Eos: 4 %
Hematocrit: 39.7 % (ref 37.5–51.0)
Hemoglobin: 13.5 g/dL (ref 13.0–17.7)
Immature Grans (Abs): 0 10*3/uL (ref 0.0–0.1)
Immature Granulocytes: 0 %
Lymphocytes Absolute: 2.4 10*3/uL (ref 0.7–3.1)
Lymphs: 27 %
MCH: 33.3 pg — ABNORMAL HIGH (ref 26.6–33.0)
MCHC: 34 g/dL (ref 31.5–35.7)
MCV: 98 fL — ABNORMAL HIGH (ref 79–97)
Monocytes Absolute: 1 10*3/uL — ABNORMAL HIGH (ref 0.1–0.9)
Monocytes: 11 %
Neutrophils Absolute: 4.9 10*3/uL (ref 1.4–7.0)
Neutrophils: 57 %
Platelets: 177 10*3/uL (ref 150–450)
RBC: 4.06 x10E6/uL — ABNORMAL LOW (ref 4.14–5.80)
RDW: 11.3 % — ABNORMAL LOW (ref 11.6–15.4)
WBC: 8.8 10*3/uL (ref 3.4–10.8)

## 2023-04-09 LAB — CMP14+EGFR
ALT: 15 IU/L (ref 0–44)
AST: 21 IU/L (ref 0–40)
Albumin: 4.3 g/dL (ref 3.7–4.7)
Alkaline Phosphatase: 42 IU/L — ABNORMAL LOW (ref 44–121)
BUN/Creatinine Ratio: 12 (ref 10–24)
BUN: 12 mg/dL (ref 8–27)
Bilirubin Total: 0.4 mg/dL (ref 0.0–1.2)
CO2: 26 mmol/L (ref 20–29)
Calcium: 9.5 mg/dL (ref 8.6–10.2)
Chloride: 100 mmol/L (ref 96–106)
Creatinine, Ser: 0.97 mg/dL (ref 0.76–1.27)
Globulin, Total: 2.2 g/dL (ref 1.5–4.5)
Glucose: 101 mg/dL — ABNORMAL HIGH (ref 70–99)
Potassium: 4.3 mmol/L (ref 3.5–5.2)
Sodium: 138 mmol/L (ref 134–144)
Total Protein: 6.5 g/dL (ref 6.0–8.5)
eGFR: 78 mL/min/{1.73_m2} (ref >59)

## 2023-04-09 LAB — LIPID PANEL
Cholesterol, Total: 138 mg/dL (ref 100–199)
HDL: 61 mg/dL (ref >39)
LDL CALC COMMENT:: 2.3 ratio (ref 0.0–5.0)
LDL Chol Calc (NIH): 60 mg/dL (ref 0–99)
Triglycerides: 90 mg/dL (ref 0–149)
VLDL Cholesterol Cal: 17 mg/dL (ref 5–40)

## 2023-04-10 ENCOUNTER — Ambulatory Visit: Payer: Medicare HMO | Admitting: Nurse Practitioner

## 2023-09-29 ENCOUNTER — Ambulatory Visit (INDEPENDENT_AMBULATORY_CARE_PROVIDER_SITE_OTHER): Payer: Medicare HMO | Admitting: Nurse Practitioner

## 2023-09-29 ENCOUNTER — Encounter: Payer: Self-pay | Admitting: Nurse Practitioner

## 2023-09-29 VITALS — BP 106/69 | HR 76 | Temp 98.2°F | Ht 72.0 in | Wt 164.0 lb

## 2023-09-29 DIAGNOSIS — Z6828 Body mass index (BMI) 28.0-28.9, adult: Secondary | ICD-10-CM | POA: Diagnosis not present

## 2023-09-29 DIAGNOSIS — N400 Enlarged prostate without lower urinary tract symptoms: Secondary | ICD-10-CM | POA: Diagnosis not present

## 2023-09-29 DIAGNOSIS — Z0001 Encounter for general adult medical examination with abnormal findings: Secondary | ICD-10-CM

## 2023-09-29 DIAGNOSIS — E782 Mixed hyperlipidemia: Secondary | ICD-10-CM

## 2023-09-29 DIAGNOSIS — I10 Essential (primary) hypertension: Secondary | ICD-10-CM

## 2023-09-29 DIAGNOSIS — Q825 Congenital non-neoplastic nevus: Secondary | ICD-10-CM

## 2023-09-29 DIAGNOSIS — K432 Incisional hernia without obstruction or gangrene: Secondary | ICD-10-CM | POA: Diagnosis not present

## 2023-09-29 DIAGNOSIS — Z0000 Encounter for general adult medical examination without abnormal findings: Secondary | ICD-10-CM

## 2023-09-29 MED ORDER — FINASTERIDE 5 MG PO TABS: 5.0000 mg | ORAL_TABLET | Freq: Every day | ORAL | 1 refills | Status: AC

## 2023-09-29 MED ORDER — LISINOPRIL-HYDROCHLOROTHIAZIDE 10-12.5 MG PO TABS: 1.0000 | ORAL_TABLET | Freq: Every day | ORAL | 1 refills | Status: AC

## 2023-09-29 MED ORDER — SIMVASTATIN 40 MG PO TABS: 40.0000 mg | ORAL_TABLET | Freq: Every day | ORAL | 1 refills | Status: AC

## 2023-09-29 NOTE — Patient Instructions (Signed)
 Fall Prevention in the Home, Adult Falls can cause injuries and can happen to people of all ages. There are many things you can do to make your home safer and to help prevent falls. What actions can I take to prevent falls? General information Use good lighting in all rooms. Make sure to: Replace any light bulbs that burn out. Turn on the lights in dark areas and use night-lights. Keep items that you use often in easy-to-reach places. Lower the shelves around your home if needed. Move furniture so that there are clear paths around it. Do not use throw rugs or other things on the floor that can make you trip. If any of your floors are uneven, fix them. Add color or contrast paint or tape to clearly mark and help you see: Grab bars or handrails. First and last steps of staircases. Where the edge of each step is. If you use a ladder or stepladder: Make sure that it is fully opened. Do not climb a closed ladder. Make sure the sides of the ladder are locked in place. Have someone hold the ladder while you use it. Know where your pets are as you move through your home. What can I do in the bathroom?     Keep the floor dry. Clean up any water on the floor right away. Remove soap buildup in the bathtub or shower. Buildup makes bathtubs and showers slippery. Use non-skid mats or decals on the floor of the bathtub or shower. Attach bath mats securely with double-sided, non-slip rug tape. If you need to sit down in the shower, use a non-slip stool. Install grab bars by the toilet and in the bathtub and shower. Do not use towel bars as grab bars. What can I do in the bedroom? Make sure that you have a light by your bed that is easy to reach. Do not use any sheets or blankets on your bed that hang to the floor. Have a firm chair or bench with side arms that you can use for support when you get dressed. What can I do in the kitchen? Clean up any spills right away. If you need to reach something  above you, use a step stool with a grab bar. Keep electrical cords out of the way. Do not use floor polish or wax that makes floors slippery. What can I do with my stairs? Do not leave anything on the stairs. Make sure that you have a light switch at the top and the bottom of the stairs. Make sure that there are handrails on both sides of the stairs. Fix handrails that are broken or loose. Install non-slip stair treads on all your stairs if they do not have carpet. Avoid having throw rugs at the top or bottom of the stairs. Choose a carpet that does not hide the edge of the steps on the stairs. Make sure that the carpet is firmly attached to the stairs. Fix carpet that is loose or worn. What can I do on the outside of my home? Use bright outdoor lighting. Fix the edges of walkways and driveways and fix any cracks. Clear paths of anything that can make you trip, such as tools or rocks. Add color or contrast paint or tape to clearly mark and help you see anything that might make you trip as you walk through a door, such as a raised step or threshold. Trim any bushes or trees on paths to your home. Check to see if handrails are loose  or broken and that both sides of all steps have handrails. Install guardrails along the edges of any raised decks and porches. Have leaves, snow, or ice cleared regularly. Use sand, salt, or ice melter on paths if you live where there is ice and snow during the winter. Clean up any spills in your garage right away. This includes grease or oil spills. What other actions can I take? Review your medicines with your doctor. Some medicines can cause dizziness or changes in blood pressure, which increase your risk of falling. Wear shoes that: Have a low heel. Do not wear high heels. Have rubber bottoms and are closed at the toe. Feel good on your feet and fit well. Use tools that help you move around if needed. These include: Canes. Walkers. Scooters. Crutches. Ask  your doctor what else you can do to help prevent falls. This may include seeing a physical therapist to learn to do exercises to move better and get stronger. Where to find more information Centers for Disease Control and Prevention, STEADI: TonerPromos.no [employer] on Aging: BaseRingTones.pl National Institute on Aging: BaseRingTones.pl Contact a doctor if: You are afraid of falling at home. You feel weak, drowsy, or dizzy at home. You fall at home. Get help right away if you: Lose consciousness or have trouble moving after a fall. Have a fall that causes a head injury. These symptoms may be an emergency. Get help right away. Call 911. Do not wait to see if the symptoms will go away. Do not drive yourself to the hospital. This information is not intended to replace advice given to you by your health care provider. Make sure you discuss any questions you have with your health care provider. Document Revised: 10/08/2021 Document Reviewed: 10/08/2021 Elsevier Patient Education  2024 [employer].

## 2023-09-29 NOTE — Progress Notes (Signed)
 Subjective:    Patient ID: Andre Estrada, male    DOB: April 02, 1940, 83 y.o.   MRN: [id number]   Chief Complaint: annual physical    HPI:  Andre Estrada is a 83 y.o. who identifies as a male who was assigned male at birth.   Social history: Lives with: nephew Work history: retired   Water engineer in today for follow up of the following chronic medical issues:  1. Primary hypertension No c/o chest pain, sob or headache. He does not check blood pressure at home. BP Readings from Last 3 Encounters:  04/08/23 134/89  10/08/22 132/76  04/08/22 126/73     2. Mixed hyperlipidemia Does not watch diet and does no dedicated exercise Lab Results  Component Value Date   CHOL 138 04/08/2023   HDL 61 04/08/2023   LDLCALC 60 04/08/2023   TRIG 90 04/08/2023   CHOLHDL 2.3 04/08/2023     3. Incisional hernia, without obstruction or gangrene Has large abdominal hernia but e denies any pain or discomfort  4. Birth mark Large birthmark on left side of face- no changes  5. BPH without lower urinary track symptoms Is on finesteride daily and denies any urinary problems. She's urology yearly Lab Results  Component Value Date   PSA1 3.6 10/08/2022   PSA1 5.4 (H) 01/08/2022   PSA1 3.0 10/05/2021   PSA 6.5 (H) 02/14/2014      6. BMI 28.0-28.9,adult Maintaining weight Wt Readings from Last 3 Encounters:  09/29/23 164 lb (74.4 kg)  04/08/23 162 lb (73.5 kg)  03/17/23 160 lb (72.6 kg)   BMI Readings from Last 3 Encounters:  09/29/23 22.24 kg/m  04/08/23 21.97 kg/m  03/17/23 21.70 kg/m            New complaints: None today  Allergies  Allergen Reactions   Sulfa Antibiotics    Outpatient Encounter Medications as of 09/29/2023  Medication Sig   aspirin 81 MG tablet Take 81 mg by mouth daily.   finasteride  (PROSCAR ) 5 MG tablet Take 1 tablet (5 mg total) by mouth daily.   hydrocortisone  1 % lotion Apply 1 application topically 2 (two) times daily.    lisinopril -hydrochlorothiazide  (ZESTORETIC ) 10-12.5 MG tablet Take 1 tablet by mouth daily.   simvastatin  (ZOCOR ) 40 MG tablet Take 1 tablet (40 mg total) by mouth at bedtime.   triamcinolone  cream (KENALOG ) 0.1 % Apply 1 application topically 2 (two) times daily.   No facility-administered encounter medications on file as of 09/29/2023.    Past Surgical History:  Procedure Laterality Date   LYMPH NODE BIOPSY     MELANOMA EXCISION     PARTIAL COLECTOMY     SPLENECTOMY, TOTAL      Family History  Problem Relation Age of Onset   Coronary artery disease Father    COPD Father    Prostatitis Brother    Diabetes Son    Prostatitis Brother    Alcohol abuse Brother       Controlled substance contract: n/a     Review of Systems  Constitutional:  Negative for diaphoresis.  Eyes:  Negative for pain.  Respiratory:  Negative for shortness of breath.   Cardiovascular:  Negative for chest pain, palpitations and leg swelling.  Gastrointestinal:  Negative for abdominal pain.  Endocrine: Negative for polydipsia.  Skin:  Negative for rash.  Neurological:  Negative for dizziness, weakness and headaches.  Hematological:  Does not bruise/bleed easily.  All other systems reviewed and are negative.  Objective:   Physical Exam Vitals and nursing note reviewed.  Constitutional:      Appearance: Normal appearance. He is well-developed.  HENT:     Head: Normocephalic.     Nose: Nose normal.     Mouth/Throat:     Mouth: Mucous membranes are moist.     Pharynx: Oropharynx is clear.  Eyes:     Pupils: Pupils are equal, round, and reactive to light.  Neck:     Thyroid: No thyroid mass or thyromegaly.     Vascular: No carotid bruit or JVD.     Trachea: Phonation normal.  Cardiovascular:     Rate and Rhythm: Normal rate and regular rhythm.  Pulmonary:     Effort: Pulmonary effort is normal. No respiratory distress.     Breath sounds: Normal breath sounds.  Abdominal:      General: Bowel sounds are normal.     Palpations: Abdomen is soft.     Tenderness: There is no abdominal tenderness.     Hernia: A hernia (soft nontender incisional hernia) is present.  Musculoskeletal:        General: Normal range of motion.     Cervical back: Normal range of motion and neck supple.  Lymphadenopathy:     Cervical: No cervical adenopathy.  Skin:    General: Skin is warm and dry.     Comments: Birth mark- purple seedy on left side of face unchanged  Neurological:     Mental Status: He is alert and oriented to person, place, and time.  Psychiatric:        Behavior: Behavior normal.        Thought Content: Thought content normal.        Judgment: Judgment normal.      BP 106/69   Pulse 76   Temp 98.2 F (36.8 C)   Ht 6' (1.829 m)   Wt 164 lb (74.4 kg)   SpO2 96%   BMI 22.24 kg/m         Assessment & Plan:   [facility] comes in today with chief complaint annual physical   Diagnosis and orders addressed:  1. Primary hypertension Low sodium diet - lisinopril -hydrochlorothiazide  (ZESTORETIC ) 10-12.5 MG tablet; Take 1 tablet by mouth daily.  Dispense: 90 tablet; Refill: 1 - CBC with Differential/Platelet - CMP14+EGFR  2. Mixed hyperlipidemia Low fat diet - simvastatin  (ZOCOR ) 40 MG tablet; Take 1 tablet (40 mg total) by mouth at bedtime.  Dispense: 90 tablet; Refill: 1 - Lipid panel  3. Incisional hernia, without obstruction or gangrene Report any pain  4. Birth mark Report any changes  5. BMI 28.0-28.9,adult Discussed diet and exercise for person with BMI >25 Will recheck weight in 3-6 months   6. Benign prostatic hyperplasia without lower urinary tract symptoms Report any voiding issues - finasteride  (PROSCAR ) 5 MG tablet; Take 1 tablet (5 mg total) by mouth daily.  Dispense: 90 tablet; Refill: 1   Labs pending Health Maintenance reviewed Diet and exercise encouraged  Follow up plan: 6 months   Mary-Margaret Gladis, FNP

## 2023-09-30 ENCOUNTER — Ambulatory Visit: Payer: Self-pay | Admitting: Nurse Practitioner

## 2023-09-30 LAB — CBC WITH DIFFERENTIAL/PLATELET
Basophils Absolute: 0 x10E3/uL (ref 0.0–0.2)
Basos: 1 %
EOS (ABSOLUTE): 0.4 x10E3/uL (ref 0.0–0.4)
Eos: 5 %
Hematocrit: 42.8 % (ref 37.5–51.0)
Hemoglobin: 14.9 g/dL (ref 13.0–17.7)
Immature Grans (Abs): 0.1 x10E3/uL (ref 0.0–0.1)
Immature Granulocytes: 1 %
Lymphocytes Absolute: 2.6 x10E3/uL (ref 0.7–3.1)
Lymphs: 35 %
MCH: 34 pg — ABNORMAL HIGH (ref 26.6–33.0)
MCHC: 34.8 g/dL (ref 31.5–35.7)
MCV: 98 fL — ABNORMAL HIGH (ref 79–97)
Monocytes Absolute: 0.9 x10E3/uL (ref 0.1–0.9)
Monocytes: 12 %
Neutrophils Absolute: 3.4 x10E3/uL (ref 1.4–7.0)
Neutrophils: 46 %
Platelets: 127 x10E3/uL — ABNORMAL LOW (ref 150–450)
RBC: 4.38 x10E6/uL (ref 4.14–5.80)
RDW: 12.4 % (ref 11.6–15.4)
WBC: 7.3 x10E3/uL (ref 3.4–10.8)

## 2023-09-30 LAB — CMP14+EGFR
ALT: 14 IU/L (ref 0–44)
AST: 24 IU/L (ref 0–40)
Albumin: 4.5 g/dL (ref 3.7–4.7)
Alkaline Phosphatase: 44 IU/L (ref 44–121)
BUN/Creatinine Ratio: 17 (ref 10–24)
BUN: 19 mg/dL (ref 8–27)
Bilirubin Total: 0.4 mg/dL (ref 0.0–1.2)
CO2: 22 mmol/L (ref 20–29)
Calcium: 9.4 mg/dL (ref 8.6–10.2)
Chloride: 101 mmol/L (ref 96–106)
Creatinine, Ser: 1.14 mg/dL (ref 0.76–1.27)
Globulin, Total: 2 g/dL (ref 1.5–4.5)
Glucose: 122 mg/dL — ABNORMAL HIGH (ref 70–99)
Potassium: 3.7 mmol/L (ref 3.5–5.2)
Sodium: 140 mmol/L (ref 134–144)
Total Protein: 6.5 g/dL (ref 6.0–8.5)
eGFR: 65 mL/min/1.73 (ref >59)

## 2023-09-30 LAB — LIPID PANEL
Chol/HDL Ratio: 2.8 ratio (ref 0.0–5.0)
Cholesterol, Total: 157 mg/dL (ref 100–199)
HDL: 56 mg/dL (ref >39)
LDL Chol Calc (NIH): 77 mg/dL (ref 0–99)
Triglycerides: 135 mg/dL (ref 0–149)
VLDL Cholesterol Cal: 24 mg/dL (ref 5–40)

## 2023-10-01 ENCOUNTER — Telehealth: Payer: Self-pay

## 2023-10-01 ENCOUNTER — Other Ambulatory Visit: Payer: Self-pay

## 2023-10-01 DIAGNOSIS — I10 Essential (primary) hypertension: Secondary | ICD-10-CM

## 2023-10-01 DIAGNOSIS — E782 Mixed hyperlipidemia: Secondary | ICD-10-CM

## 2023-10-01 DIAGNOSIS — D696 Thrombocytopenia, unspecified: Secondary | ICD-10-CM

## 2023-10-01 NOTE — Telephone Encounter (Signed)
 Copied from CRM [id number]. Topic: Clinical - Lab/Test Results >> Sep 30, 2023  5:32 PM Delon DASEN wrote: Reason for CRM: read lab report per [facility] verbatim, advised will need labs in 2 weeks, need order put in for scheduling

## 2023-10-01 NOTE — Telephone Encounter (Signed)
 Appointment made for lab - order placed

## 2023-10-01 NOTE — Telephone Encounter (Signed)
 Copied from CRM [id number]. Topic: Clinical - Request for Lab/Test Order >> Sep 30, 2023  5:32 PM Andre Estrada wrote: Reason for CRM: per message patient needs labs in two weeks

## 2023-10-13 ENCOUNTER — Other Ambulatory Visit

## 2024-03-17 ENCOUNTER — Ambulatory Visit: Payer: Medicare HMO

## 2024-03-29 ENCOUNTER — Ambulatory Visit: Payer: Self-pay | Admitting: Nurse Practitioner
